# Patient Record
Sex: Female | Born: 1970 | Race: White | Hispanic: Yes | Marital: Married | Smoking: Never smoker
Health system: Southern US, Community
[De-identification: ages and names within clinical notes are randomized; demographics above are authoritative.]

## PROBLEM LIST (undated history)

## (undated) DIAGNOSIS — D649 Anemia, unspecified: Secondary | ICD-10-CM

## (undated) DIAGNOSIS — I10 Essential (primary) hypertension: Secondary | ICD-10-CM

---

## 2000-06-07 ENCOUNTER — Other Ambulatory Visit: Admission: RE | Admit: 2000-06-07 | Discharge: 2000-06-07 | Payer: Self-pay | Admitting: Gynecology

## 2000-12-05 ENCOUNTER — Inpatient Hospital Stay (HOSPITAL_COMMUNITY): Admission: AD | Admit: 2000-12-05 | Discharge: 2000-12-08 | Payer: Self-pay | Admitting: *Deleted

## 2000-12-05 ENCOUNTER — Encounter (INDEPENDENT_AMBULATORY_CARE_PROVIDER_SITE_OTHER): Payer: Self-pay

## 2005-03-04 ENCOUNTER — Encounter: Admission: RE | Admit: 2005-03-04 | Discharge: 2005-03-04 | Payer: Self-pay | Admitting: *Deleted

## 2005-03-22 ENCOUNTER — Encounter: Admission: RE | Admit: 2005-03-22 | Discharge: 2005-03-22 | Payer: Self-pay | Admitting: *Deleted

## 2008-04-02 ENCOUNTER — Ambulatory Visit: Payer: Self-pay | Admitting: Obstetrics & Gynecology

## 2008-04-11 ENCOUNTER — Ambulatory Visit (HOSPITAL_COMMUNITY): Admission: RE | Admit: 2008-04-11 | Discharge: 2008-04-11 | Payer: Self-pay | Admitting: Obstetrics & Gynecology

## 2008-04-30 ENCOUNTER — Ambulatory Visit: Payer: Self-pay | Admitting: Obstetrics and Gynecology

## 2008-11-02 ENCOUNTER — Emergency Department (HOSPITAL_COMMUNITY): Admission: EM | Admit: 2008-11-02 | Discharge: 2008-11-02 | Payer: Self-pay | Admitting: Emergency Medicine

## 2009-01-15 ENCOUNTER — Emergency Department (HOSPITAL_COMMUNITY): Admission: EM | Admit: 2009-01-15 | Discharge: 2009-01-15 | Payer: Self-pay | Admitting: Family Medicine

## 2010-02-19 ENCOUNTER — Encounter: Admission: RE | Admit: 2010-02-19 | Discharge: 2010-02-19 | Payer: Self-pay | Admitting: Family Medicine

## 2010-09-20 ENCOUNTER — Inpatient Hospital Stay (HOSPITAL_COMMUNITY)
Admission: AD | Admit: 2010-09-20 | Discharge: 2010-09-20 | Disposition: A | Discharge status: Home / Self Care | Payer: Self-pay | Source: Ambulatory Visit | Attending: Obstetrics & Gynecology | Admitting: Obstetrics & Gynecology

## 2010-09-20 ENCOUNTER — Inpatient Hospital Stay (HOSPITAL_COMMUNITY): Payer: Self-pay

## 2010-09-20 DIAGNOSIS — O200 Threatened abortion: Secondary | ICD-10-CM | POA: Insufficient documentation

## 2010-09-20 DIAGNOSIS — N939 Abnormal uterine and vaginal bleeding, unspecified: Secondary | ICD-10-CM

## 2010-09-20 LAB — CBC
Platelets: 306 10*3/uL (ref 150–400)
WBC: 5.9 10*3/uL (ref 4.0–10.5)

## 2010-09-20 LAB — WET PREP, GENITAL
Trich, Wet Prep: NONE SEEN
Yeast Wet Prep HPF POC: NONE SEEN

## 2010-09-20 LAB — ABO/RH: ABO/RH(D): O POS

## 2010-09-22 ENCOUNTER — Inpatient Hospital Stay (HOSPITAL_COMMUNITY)
Admission: AD | Admit: 2010-09-22 | Discharge: 2010-09-22 | Disposition: A | Discharge status: Home / Self Care | Payer: Self-pay | Source: Ambulatory Visit | Attending: Obstetrics & Gynecology | Admitting: Obstetrics & Gynecology

## 2010-09-22 DIAGNOSIS — O209 Hemorrhage in early pregnancy, unspecified: Secondary | ICD-10-CM | POA: Insufficient documentation

## 2010-09-22 LAB — POCT PREGNANCY, URINE: Preg Test, Ur: NEGATIVE

## 2010-09-29 ENCOUNTER — Inpatient Hospital Stay (HOSPITAL_COMMUNITY)
Admission: AD | Admit: 2010-09-29 | Discharge: 2010-09-29 | Disposition: A | Discharge status: Home / Self Care | Payer: Self-pay | Source: Ambulatory Visit | Attending: Obstetrics & Gynecology | Admitting: Obstetrics & Gynecology

## 2010-09-29 DIAGNOSIS — O209 Hemorrhage in early pregnancy, unspecified: Secondary | ICD-10-CM | POA: Insufficient documentation

## 2010-10-06 ENCOUNTER — Inpatient Hospital Stay (HOSPITAL_COMMUNITY)
Admission: AD | Admit: 2010-10-06 | Discharge: 2010-10-06 | Disposition: A | Discharge status: Home / Self Care | Payer: Self-pay | Source: Ambulatory Visit | Attending: Obstetrics and Gynecology | Admitting: Obstetrics and Gynecology

## 2010-10-06 DIAGNOSIS — O039 Complete or unspecified spontaneous abortion without complication: Secondary | ICD-10-CM | POA: Insufficient documentation

## 2010-10-06 LAB — HCG, QUANTITATIVE, PREGNANCY: hCG, Beta Chain, Quant, S: 7 m[IU]/mL — ABNORMAL HIGH (ref <5)

## 2010-10-21 ENCOUNTER — Encounter (INDEPENDENT_AMBULATORY_CARE_PROVIDER_SITE_OTHER): Payer: Self-pay | Admitting: Advanced Practice Midwife

## 2010-10-21 DIAGNOSIS — O039 Complete or unspecified spontaneous abortion without complication: Secondary | ICD-10-CM

## 2010-10-22 NOTE — Progress Notes (Unsigned)
NAMELoleta Hernandez               ACCOUNT NO.:  [id number]  MEDICAL RECORD NO.:  [id number]           PATIENT TYPE:  A  LOCATION:  [facility]                   FACILITY:  [facility]  PHYSICIAN:  Wynelle Bourgeois, CNM    DATE OF BIRTH:  1971-01-29  DATE OF SERVICE:  10/21/2010                                 CLINIC NOTE  This is a post-miscarriage exam for this 40 year old female who is gravida 4, para 3-0-1-3.  She experienced a spontaneous complete abortion on October 06, 2010.  She initially came in on September 20, 2010, with HCG of 48.  Ultrasound at that time showed no intrauterine gestational sac or adnexal masses.  Repeat quant was 43 and 42, and then she had her last quant on October 06, 2010, and it was 7.  She had no significant bleeding or cramping with that.  MEDICAL HISTORY:  Remarkable for anemia.  SURGICAL HISTORY:  Remarkable for C-section.  She requires no medication or surgery to complete her miscarriage and presents today just for a final exam.  ALLERGIES:  None.  MEDICATIONS:  Prenatal vitamins.  PHYSICAL EXAMINATION:  VITAL SIGNS:  Temperature 97.6, pulse 84, blood pressure 132/83, weight 147.2, height 62 inches. ABDOMEN:  Soft and nontender.  There are no masses appreciated, although the abdomen is obese and difficult to completely examine. GU:  EG/BUS within normal limits.  There is no bleeding.  Her vagina is clear and well rugated.  Cervix is closed, multiparous.  Uterus is small, well involuted with no masses.  Adnexa clear with no pain or masses although difficult to appreciate secondary to habitus.  ASSESSMENT:  Status post complete spontaneous abortion.  PLAN: 1. The patient reassured that her findings are normal. 2. May resume normal activity. 3. Discussed contraception and the patient prefers to use condoms and     have no prescribed contraception. 4. Annual exam due in August 2012.  The patient will either go to     [facility] or come here for  that.          ______________________________ Wynelle Bourgeois, CNM   MW/MEDQ  D:  10/21/2010  T:  10/22/2010  Job:  [id number]

## 2010-10-24 LAB — POCT URINALYSIS DIP (DEVICE)
Glucose, UA: NEGATIVE mg/dL
Hgb urine dipstick: NEGATIVE
Specific Gravity, Urine: 1.01 (ref 1.005–1.030)

## 2010-10-24 LAB — POCT PREGNANCY, URINE: Preg Test, Ur: NEGATIVE

## 2010-10-27 LAB — URINALYSIS, ROUTINE W REFLEX MICROSCOPIC
Bilirubin Urine: NEGATIVE
Glucose, UA: NEGATIVE mg/dL
Nitrite: NEGATIVE
Protein, ur: NEGATIVE mg/dL
Urobilinogen, UA: 0.2 mg/dL (ref 0.0–1.0)

## 2010-10-27 LAB — URINE MICROSCOPIC-ADD ON

## 2010-10-27 LAB — POCT PREGNANCY, URINE: Preg Test, Ur: NEGATIVE

## 2010-11-30 NOTE — Group Therapy Note (Signed)
NAME:  Christy Hernandez, HELMER       ACCOUNT NO.:  [id number]   MEDICAL RECORD NO.:  [id number]          PATIENT TYPE:  WOC   LOCATION[facility]  [facility]                   FACILITY[facility]  [facility]   PHYSICIAN:  Allie Bossier, MD        DATE OF BIRTH:  1971/03/28   DATE OF SERVICE:                                  CLINIC NOTE   Byrd Hesselbach is a 40 year old married Hispanic gravida 3, para 3 who was seen  for her well-woman exam at the health department and she had GC and  Chlamydia cultures and Pap smear done, they were normal.  This was done  in July 2009.  At that time, her uterus was described as slightly  enlarged, questionable fibroids.  The patient herself says that she has  only rare mild dyspareunia.  Her periods are monthly and last 3 days and  she denies dysmenorrhea.   On physical exam, her uterus is a 14-week size uterus.  I am unable to  differentiate/palpate her adnexa.   ASSESSMENT AND PLAN:  Uterine enlargement, probable fibroids,  asymptomatic.  I will order an ultrasound.  A baseline examination of  the uterus and adnexa, which she will followup after that for results.      Allie Bossier, MD     MCD/MEDQ  D:  04/02/2008  T:  04/03/2008  Job:  [id number]

## 2010-11-30 NOTE — Group Therapy Note (Signed)
Christy Hernandez, Christy Hernandez       ACCOUNT NO.:  [id number]   MEDICAL RECORD NO.:  [id number]          PATIENT TYPE:  WOC   [facility]ATION[facility]  [facility]                   [facility]ACILITY[facility]  [facility]   PHYSICIAN:  Deirdre Poe, CNM       DATE OF BIRTH:  1970-11-27   DATE OF SERVICE:                                  CLINIC NOTE   HISTORY:  This is a 40 year old Hispanic, G3, P3, who had been referred  to [place] on April 02, 2008, to evaluate a clinically enlarged uterus  with questionable fibroids.  This was an incidental clinical findings  when she went for her well-woman exam at the health department.  She was  seen here by Dr. Marice Potter who felt that her uterus was enlarged to  approximately 14 weeks' size, and therefore she was sent for a pelvic  ultrasound.  However, her pelvic ultrasound done on April 11, 2008,  revealed no evidence of uterine fibroids, and the uterus was considered  to be upper limits of normal in size which measured 10 cm __________ 7  cm transverse.  Double endometrial thickness was 7 mm.  Ovaries were  normal.  No adnexal masses or free fluid.  These results were shared  with the patient and she is reassured.  She remains asymptomatic and  will return to [place] p.r.n.            ______________________________  Christy Hernandez, CNM     DP/MEDQ  D:  04/30/2008  T:  05/01/2008  Job:  [id number]

## 2010-12-03 NOTE — Op Note (Signed)
[facility]  Patient:    Christy Hernandez                      MRN: [id number] Proc. Date: 12/06/00 Adm. Date:  [id number] Attending:  Michaelle Copas                           Operative Report  PREOPERATIVE DIAGNOSIS:       Intrauterine pregnancy at term with occult cord prolapse.  POSTOPERATIVE DIAGNOSIS:      Intrauterine pregnancy at term with occult cord prolapse.  OPERATION:                    Low transverse cesarean section under state conditions.  SURGEON:                      Conni Elliot, M.D.  ASSISTANT:                    Jamey Reas, M.D.  ANESTHESIA:                   General orotracheal.  FINDINGS:                     7 pound 4 ounce female with Apgars of 9 and 9, cord pH 7.30.  ESTIMATED BLOOD LOSS:  DESCRIPTION OF PROCEDURE:     The patient was brought emergently to the operating room and placed under general orotracheal anesthesia.  The abdomen was prepped and draped in the usual sterile fashion.  General anesthesia was urgently induced and a low transverse Pfannenstiel incision was made. Incision was made through the skin, subcutaneous tissue, and fascia.  The peritoneal cavity entered, bladder flap created.  A low transverse uterine incision was made.  The baby was delivered from the vertex presentation.  The occult cord prolapse was identified.  The cord was doubly clamped and cut. The baby was handed to neonatologist in attendance.  The placenta was delivered spontaneously.  Uterus, bladder flap, anterior peritoneum, fascia, subcutaneous tissue, and skin were closed in routine fashion.  Estimated blood loss was less than 800 cc.  Sponge, needle, and instrument counts were correct. DD:  12/06/00 TD:  12/06/00 Job: [id number]

## 2010-12-03 NOTE — Discharge Summary (Signed)
[facility]  Patient:    Christy Hernandez                      MRN: [id number] Adm. Date:  [id number] Disc. Date: [id number] Attending:  Michaelle Copas                           Discharge Summary  DATE OF BIRTH:                Mar 24, 1971  DISCHARGE DIAGNOSES:          1. Term pregnancy, delivered.                               2. Occult cord necessitating emergent low                                  transverse cesarean section delivery.  DISCHARGE MEDICATIONS:        1. Prenatal vitamins one p.o. q.d.                               2. Tylox one p.o. q.6h. p.r.n. pain.                               3. Motrin 600 mg p.o. q.6h. p.r.n. pain.  HISTORY OF PRESENT ILLNESS:   Patient is a 40 year old G3, P2-0-0-2 who presented at 78 and 1 weeks in active labor.  Cervical examination on admission was 4, 60, and -1.  She was admitted to labor and delivery with routine orders and was GBS negative.  She was contracting every three to four minutes.  Fetal heart rate was stable 135-145 with good long-term variability and mild variable decelerations.  HOSPITAL COURSE:              The patient was admitted to L&D with routine orders.  She was 4 cm with a bulging bag of waters so artificial rupture of membranes was performed at which time an occult umbilical cord was palpated as presenting and for this reason the patient was taken immediately for emergent low transverse cesarean section.  The fetal heart rate did remain stable and Apgars at delivery were 9 and 9 with a cord pH of 7.30.  EBL was less than 800 cc.  Please see the dictated operative note for details.  Postoperatively the patient did very well with no complications.  She remained afebrile with fundus firm and no pain.  Her hemoglobin remained stable.  She was breast and bottle feeding the baby girl with no difficulties.  On day #3 of her hospitalization it was felt that she was stable for  discharge home. Hemoglobin and hematocrit the day prior to discharge were 10.9 and 32.2.  DISPOSITION:                  The patient was discharged home.  DISCHARGE INSTRUCTIONS:       She was instructed on the care of her incision and to return to [facility]U in three to four days for removal of her staples and then she is to follow-up in six weeks at [facility] for a postpartum check.DD: 12/08/00 TD:  12/09/00 Job: [id number]

## 2011-04-29 LAB — HIV ANTIBODY (ROUTINE TESTING W REFLEX): HIV: NONREACTIVE

## 2011-05-03 ENCOUNTER — Other Ambulatory Visit: Payer: Self-pay | Admitting: Family Medicine

## 2011-05-03 DIAGNOSIS — Z3689 Encounter for other specified antenatal screening: Secondary | ICD-10-CM

## 2011-05-04 ENCOUNTER — Ambulatory Visit (HOSPITAL_COMMUNITY)
Admission: RE | Admit: 2011-05-04 | Discharge: 2011-05-04 | Disposition: A | Discharge status: Home / Self Care | Payer: Medicaid Other | Source: Ambulatory Visit | Attending: Family Medicine | Admitting: Family Medicine

## 2011-05-04 DIAGNOSIS — Z1389 Encounter for screening for other disorder: Secondary | ICD-10-CM | POA: Insufficient documentation

## 2011-05-04 DIAGNOSIS — Z363 Encounter for antenatal screening for malformations: Secondary | ICD-10-CM | POA: Insufficient documentation

## 2011-05-04 DIAGNOSIS — O358XX0 Maternal care for other (suspected) fetal abnormality and damage, not applicable or unspecified: Secondary | ICD-10-CM | POA: Insufficient documentation

## 2011-05-04 DIAGNOSIS — O09529 Supervision of elderly multigravida, unspecified trimester: Secondary | ICD-10-CM | POA: Insufficient documentation

## 2011-05-04 DIAGNOSIS — Z3689 Encounter for other specified antenatal screening: Secondary | ICD-10-CM

## 2011-05-09 ENCOUNTER — Other Ambulatory Visit (HOSPITAL_COMMUNITY): Payer: Self-pay | Admitting: Physician Assistant

## 2011-05-09 DIAGNOSIS — Z0489 Encounter for examination and observation for other specified reasons: Secondary | ICD-10-CM

## 2011-05-25 ENCOUNTER — Ambulatory Visit (HOSPITAL_COMMUNITY): Payer: Medicaid Other

## 2011-05-27 ENCOUNTER — Ambulatory Visit (HOSPITAL_COMMUNITY)
Admission: RE | Admit: 2011-05-27 | Discharge: 2011-05-27 | Disposition: A | Discharge status: Home / Self Care | Payer: Medicaid Other | Source: Ambulatory Visit | Attending: Physician Assistant | Admitting: Physician Assistant

## 2011-05-27 DIAGNOSIS — Z0489 Encounter for examination and observation for other specified reasons: Secondary | ICD-10-CM

## 2011-05-27 DIAGNOSIS — Z3689 Encounter for other specified antenatal screening: Secondary | ICD-10-CM | POA: Insufficient documentation

## 2011-07-19 NOTE — L&D Delivery Note (Signed)
Delivery Note At 2:06 PM a viable and healthy female was delivered via VBAC, Spontaneous (Presentation: ; Occiput Anterior).  APGAR: 9, 9; weight 8 lb 5 oz (3770 g).   Placenta status: Intact, Spontaneous.  Cord: 3 vessels with the following complications: None.  Cord pH: Not indicated  Anesthesia: None  Episiotomy: None Lacerations: 2nd degree;Perineal Suture Repair: 3.0 vicryl Est. Blood Loss (mL):  Mom to postpartum.  Baby to nursery-stable. Dr. Debroah Loop was present for delivery and performed laceration repair.  Christy Hernandez 09/20/2011, 2:34 PM

## 2011-08-26 LAB — STREP B DNA PROBE: GBS: NEGATIVE

## 2011-09-19 ENCOUNTER — Inpatient Hospital Stay (HOSPITAL_COMMUNITY)
Admission: AD | Admit: 2011-09-19 | Discharge: 2011-09-20 | Disposition: A | Discharge status: Home Health | Payer: Medicaid Other | Attending: Obstetrics & Gynecology | Admitting: Obstetrics & Gynecology

## 2011-09-19 ENCOUNTER — Encounter (HOSPITAL_COMMUNITY): Payer: Self-pay | Admitting: *Deleted

## 2011-09-19 DIAGNOSIS — O99891 Other specified diseases and conditions complicating pregnancy: Secondary | ICD-10-CM | POA: Insufficient documentation

## 2011-09-19 DIAGNOSIS — R109 Unspecified abdominal pain: Secondary | ICD-10-CM | POA: Insufficient documentation

## 2011-09-19 NOTE — Progress Notes (Signed)
HURT BAD SINCE -    0600

## 2011-09-20 ENCOUNTER — Inpatient Hospital Stay (HOSPITAL_COMMUNITY)
Admission: AD | Admit: 2011-09-20 | Discharge: 2011-09-22 | DRG: 775 | Disposition: A | Discharge status: Home / Self Care | Payer: Medicaid Other | Attending: Obstetrics & Gynecology | Admitting: Obstetrics & Gynecology

## 2011-09-20 ENCOUNTER — Encounter (HOSPITAL_COMMUNITY): Payer: Self-pay | Admitting: *Deleted

## 2011-09-20 DIAGNOSIS — O09529 Supervision of elderly multigravida, unspecified trimester: Secondary | ICD-10-CM | POA: Diagnosis present

## 2011-09-20 DIAGNOSIS — O34219 Maternal care for unspecified type scar from previous cesarean delivery: Secondary | ICD-10-CM | POA: Diagnosis present

## 2011-09-20 LAB — CBC
Platelets: 310 10*3/uL (ref 150–400)
RBC: 3.85 MIL/uL — ABNORMAL LOW (ref 3.87–5.11)
WBC: 10.4 10*3/uL (ref 4.0–10.5)

## 2011-09-20 MED ORDER — ZOLPIDEM TARTRATE 5 MG PO TABS: 5.0000 mg | ORAL_TABLET | Freq: Every evening | ORAL | Status: DC | PRN

## 2011-09-20 MED ORDER — DIBUCAINE 1 % RE OINT: 1.0000 "application " | TOPICAL_OINTMENT | RECTAL | Status: DC | PRN

## 2011-09-20 MED ORDER — LACTATED RINGERS IV SOLN
500.0000 mL | INTRAVENOUS | Status: DC | PRN
  Administered 2011-09-20: 500 mL via INTRAVENOUS

## 2011-09-20 MED ORDER — TERBUTALINE SULFATE 1 MG/ML IJ SOLN: 0.2500 mg | Freq: Once | INTRAMUSCULAR | Status: DC | PRN

## 2011-09-20 MED ORDER — BENZOCAINE-MENTHOL 20-0.5 % EX AERO: 1.0000 "application " | INHALATION_SPRAY | CUTANEOUS | Status: DC | PRN

## 2011-09-20 MED ORDER — LACTATED RINGERS IV SOLN
INTRAVENOUS | Status: DC
  Administered 2011-09-20 (×2): via INTRAVENOUS

## 2011-09-20 MED ORDER — ONDANSETRON HCL 4 MG/2ML IJ SOLN: 4.0000 mg | Freq: Four times a day (QID) | INTRAMUSCULAR | Status: DC | PRN

## 2011-09-20 MED ORDER — OXYTOCIN BOLUS FROM INFUSION
500.0000 mL | Freq: Once | INTRAVENOUS | Status: DC
  Filled 2011-09-20: qty 500

## 2011-09-20 MED ORDER — ACETAMINOPHEN 325 MG PO TABS: 650.0000 mg | ORAL_TABLET | ORAL | Status: DC | PRN

## 2011-09-20 MED ORDER — SIMETHICONE 80 MG PO CHEW: 80.0000 mg | CHEWABLE_TABLET | ORAL | Status: DC | PRN

## 2011-09-20 MED ORDER — OXYTOCIN 20 UNITS IN LACTATED RINGERS INFUSION - SIMPLE: 125.0000 mL/h | Freq: Once | INTRAVENOUS | Status: DC

## 2011-09-20 MED ORDER — DIPHENHYDRAMINE HCL 25 MG PO CAPS: 25.0000 mg | ORAL_CAPSULE | Freq: Four times a day (QID) | ORAL | Status: DC | PRN

## 2011-09-20 MED ORDER — WITCH HAZEL-GLYCERIN EX PADS: 1.0000 "application " | MEDICATED_PAD | CUTANEOUS | Status: DC | PRN

## 2011-09-20 MED ORDER — ONDANSETRON HCL 4 MG/2ML IJ SOLN: 4.0000 mg | INTRAMUSCULAR | Status: DC | PRN

## 2011-09-20 MED ORDER — HYDROXYZINE HCL 50 MG/ML IM SOLN: 50.0000 mg | Freq: Four times a day (QID) | INTRAMUSCULAR | Status: DC | PRN

## 2011-09-20 MED ORDER — LIDOCAINE HCL (PF) 1 % IJ SOLN
30.0000 mL | INTRAMUSCULAR | Status: DC | PRN
  Administered 2011-09-20: 30 mL via SUBCUTANEOUS
  Filled 2011-09-20: qty 30

## 2011-09-20 MED ORDER — TETANUS-DIPHTH-ACELL PERTUSSIS 5-2.5-18.5 LF-MCG/0.5 IM SUSP: 0.5000 mL | Freq: Once | INTRAMUSCULAR | Status: DC

## 2011-09-20 MED ORDER — FLEET ENEMA 7-19 GM/118ML RE ENEM: 1.0000 | ENEMA | RECTAL | Status: DC | PRN

## 2011-09-20 MED ORDER — LANOLIN HYDROUS EX OINT: TOPICAL_OINTMENT | CUTANEOUS | Status: DC | PRN

## 2011-09-20 MED ORDER — OXYTOCIN 20 UNITS IN LACTATED RINGERS INFUSION - SIMPLE
1.0000 m[IU]/min | INTRAVENOUS | Status: DC
  Administered 2011-09-20: 333 m[IU]/min via INTRAVENOUS
  Administered 2011-09-20: 2 m[IU]/min via INTRAVENOUS
  Filled 2011-09-20: qty 1000

## 2011-09-20 MED ORDER — IBUPROFEN 600 MG PO TABS
600.0000 mg | ORAL_TABLET | Freq: Four times a day (QID) | ORAL | Status: DC
  Administered 2011-09-20 – 2011-09-22 (×7): 600 mg via ORAL
  Filled 2011-09-20 (×7): qty 1

## 2011-09-20 MED ORDER — ZOLPIDEM TARTRATE 5 MG PO TABS
5.0000 mg | ORAL_TABLET | Freq: Once | ORAL | Status: AC
  Administered 2011-09-20: 5 mg via ORAL
  Filled 2011-09-20: qty 1

## 2011-09-20 MED ORDER — OXYCODONE-ACETAMINOPHEN 5-325 MG PO TABS: 1.0000 | ORAL_TABLET | ORAL | Status: DC | PRN

## 2011-09-20 MED ORDER — ONDANSETRON HCL 4 MG PO TABS: 4.0000 mg | ORAL_TABLET | ORAL | Status: DC | PRN

## 2011-09-20 MED ORDER — SENNOSIDES-DOCUSATE SODIUM 8.6-50 MG PO TABS
2.0000 | ORAL_TABLET | Freq: Every day | ORAL | Status: DC
  Administered 2011-09-21 – 2011-09-22 (×2): 2 via ORAL

## 2011-09-20 MED ORDER — IBUPROFEN 600 MG PO TABS
600.0000 mg | ORAL_TABLET | Freq: Four times a day (QID) | ORAL | Status: DC | PRN
  Administered 2011-09-20: 600 mg via ORAL
  Filled 2011-09-20: qty 1

## 2011-09-20 MED ORDER — NALBUPHINE SYRINGE 5 MG/0.5 ML
5.0000 mg | INJECTION | INTRAMUSCULAR | Status: DC | PRN
  Administered 2011-09-20: 5 mg via INTRAVENOUS
  Administered 2011-09-20: 10 mg via INTRAVENOUS
  Filled 2011-09-20: qty 1
  Filled 2011-09-20: qty 0.5

## 2011-09-20 MED ORDER — HYDROXYZINE HCL 50 MG PO TABS: 50.0000 mg | ORAL_TABLET | Freq: Four times a day (QID) | ORAL | Status: DC | PRN

## 2011-09-20 MED ORDER — PRENATAL MULTIVITAMIN CH
1.0000 | ORAL_TABLET | Freq: Every day | ORAL | Status: DC
  Administered 2011-09-21 – 2011-09-22 (×2): 1 via ORAL
  Filled 2011-09-20 (×2): qty 1

## 2011-09-20 MED ORDER — CITRIC ACID-SODIUM CITRATE 334-500 MG/5ML PO SOLN: 30.0000 mL | ORAL | Status: DC | PRN

## 2011-09-20 NOTE — H&P (Signed)
Christy Hernandez is a 41 y.o. year old G43P3013 female at [redacted]w[redacted]d weeks gestation who presents to MAU reporting Spontaneous rupture of membranes at 0600 and labor. She was seen in MAU during the night w/ VE 3.5/thick. No cervical change. She has received care at [facility]. She has a Hx of LTCS w/ third child due to NRFHT's, SVD w/ first two. She desires TOLAC, consent and Op notes on chart. No mention of two-layer closure.  Maternal Medical History:  Reason for admission: Reason for admission: rupture of membranes.  Contractions: Onset was 6-12 hours ago.   Frequency: regular.   Perceived severity is strong.    Fetal activity: Perceived fetal activity is normal.   Last perceived fetal movement was within the past hour.    Prenatal Complications - Diabetes: none.    OB History    Grav Para Term Preterm Abortions TAB SAB Ect Mult Living   5 3 3  1  1   3      Past Medical History  Diagnosis Date  . No pertinent past medical history    Past Surgical History  Procedure Date  . Cesarean section    Family History: family history is not on file. Social History:  reports that she has never smoked. She does not have any smokeless tobacco history on file. She reports that she does not drink alcohol or use illicit drugs.  Review of Systems  Eyes: Negative for blurred vision.  Neurological: Negative for headaches.      There were no vitals taken for this visit. Maternal Exam:  Uterine Assessment: Contraction strength is firm.  Contraction frequency is regular.   Abdomen: Fetal presentation: vertex  Introitus: Amniotic fluid character: meconium stained. Grossly ruptured light MSF per RN  Pelvis: adequate for delivery.   Cervix: Cervix evaluated by digital exam.     Fetal Exam Fetal Monitor Review: Mode: ultrasound.   Baseline rate: 160.  Variability: moderate (6-25 bpm).   Pattern: accelerations present and no decelerations.    Fetal State Assessment: Category I - tracings are  normal.    BP 129/81  Pulse 110  Temp(Src) 98.5 F (36.9 C) (Oral)  Resp 20  Ht 5' (1.524 m)  Wt 74.844 kg (165 lb)  BMI 32.22 kg/m2  Physical Exam  Nursing note and vitals reviewed. Constitutional: She is oriented to person, place, and time. She appears well-developed and well-nourished. She appears distressed.  GI: Soft. There is no tenderness.  Neurological: She is alert and oriented to person, place, and time.  Skin: Skin is warm and dry.  Psychiatric: She has a normal mood and affect.    Prenatal labs: ABO, Rh: --/--/O POS (03/05 1655) Antibody: Negative (10/10 0000) Rubella: Immune (10/12 0000) RPR: Nonreactive (10/12 0000)  HBsAg: Negative (10/12 0000)  HIV: Non-reactive (10/12 0000)  GBS: Negative (02/08 0000) 1 hour GTT 85 Quad screen neg   Assessment: 1. Labor: active  2. Fetal Wellbeing:  3. Pain Control: requesting Nubain 4. GBS: neg 5. 40 week IUP 6. Prior C/S, desires TOLAC  Plan:  1. Admit to BS per consult with MD 2. Routine L&D orders 3. Analgesia/anesthesia PRN  4. TOLAC  Dorathy Kinsman 09/20/2011, 7:49 AM

## 2011-09-20 NOTE — Discharge Instructions (Signed)
Braxton Hicks Contractions Pregnancy is commonly associated with contractions of the uterus throughout the pregnancy. Towards the end of pregnancy (32 to 34 weeks), these contractions [facility] Rough) can develop more often and may become more forceful. This is not true labor because these contractions do not result in opening (dilatation) and thinning of the cervix. They are sometimes difficult to tell apart from true labor because these contractions can be forceful and people have different pain tolerances. You should not feel embarrassed if you go to the hospital with false labor. Sometimes, the only way to tell if you are in true labor is for your caregiver to follow the changes in the cervix. How to tell the difference between true and false labor:  False labor.   The contractions of false labor are usually shorter, irregular and not as hard as those of true labor.   They are often felt in the front of the lower abdomen and in the groin.   They may leave with walking around or changing positions while lying down.   They get weaker and are shorter lasting as time goes on.   These contractions are usually irregular.   They do not usually become progressively stronger, regular and closer together as with true labor.   True labor.   Contractions in true labor last 30 to 70 seconds, become very regular, usually become more intense, and increase in frequency.   They do not go away with walking.   The discomfort is usually felt in the top of the uterus and spreads to the lower abdomen and low back.   True labor can be determined by your caregiver with an exam. This will show that the cervix is dilating and getting thinner.  If there are no prenatal problems or other health problems associated with the pregnancy, it is completely safe to be sent home with false labor and await the onset of true labor. HOME CARE INSTRUCTIONS   Keep up with your usual exercises and instructions.   Take  medications as directed.   Keep your regular prenatal appointment.   Eat and drink lightly if you think you are going into labor.   If BH contractions are making you uncomfortable:   Change your activity position from lying down or resting to walking/walking to resting.   Sit and rest in a tub of warm water.   Drink 2 to 3 glasses of water. Dehydration may cause B-H contractions.   Do slow and deep breathing several times an hour.  SEEK IMMEDIATE MEDICAL CARE IF:   Your contractions continue to become stronger, more regular, and closer together.   You have a gushing, burst or leaking of fluid from the vagina.   An oral temperature above 102 F (38.9 C) develops.   You have passage of blood-tinged mucus.   You develop vaginal bleeding.   You develop continuous belly (abdominal) pain.   You have low back pain that you never had before.   You feel the baby's head pushing down causing pelvic pressure.   The baby is not moving as much as it used to.  Document Released: 07/04/2005 Document Revised: 06/23/2011 Document Reviewed: 12/26/2008 [facility] Patient Information 2012 [place], [place].Contracciones de Designer, multimedia ([employer]) Usted presenta un falso trabajo de Gantt. Durante todo el embarazo aparecen con frecuencia contracciones del tero. Hacia el final del embarazo (32-34 semanas) estas contracciones (Braxton Hicks) pueden hacerse ms fuertes. No se trata de un trabajo de parto verdadero porque no producen  un agrandamiento (dilatacin) y afinamiento del cuello del tero. Algunas veces resulta difcil distinguirlas del trabajo de parto verdadero porque en algunos casos llegan a ser muy intensas y las personas tienen distinta tolerancia al [employer]. No debe sentirse avergonzada si ingresa al hospital con un falso trabajo de Singer. En ocasiones la nica forma de saber si est en un parto verdadero es observar los cambios en el cuello del tero. A veces, la nica forma  de saber si realmente est en trabajo de parto es para el mdico observar los cambios en el tero. Como diferenciar el Glandorf de parto falso del verdadero:  Aggie Cosier de parto falso.   Las contracciones falsas generalmente duran menos y no son tan intensas como las verdaderas.   Generalmente se sienten en la zona inferior del abdomen y en la ingle.   Pueden aliviarse con una caminata o cambiar de posicin mientras se est acostada.   A medida que pasa el tiempo son ms cortas y dbiles.   Generalmente son irregulares.   No se hacen progresivamente ms intensas y Herbalist entre s [employer].   Trabajo de parto verdadero.   Las contracciones verdaderas duran de 30 a 70 segundos, son ms regulares, generalmente se hacen ms intensas y Comptroller.   No desaparecen al caminar.   La molestia generalmente se siente en la parte superior del tero y se extiende hacia la zona inferior del abdomen y [employer] cintura.   El profesional que la asiste podr examinarla para determinar si el trabajo de parto es verdadero. El examen mostrar si el cuello del tero se est dilatando y [place].  Si no hay problemas prenatales u otras complicaciones de la salud asociadas al embarazo, no habr inconvenientes si la envan a su casa y espera el comienzo del verdadero trabajo de Williams. INSTRUCCIONES PARA EL CUIDADO DOMICILIARIO  Siga con los ejercicios y las indicaciones habituales.   Tome los medicamentos como se le indic.   Cumpla con las citas regularmente.   Coma y beba ligero si cree que dar a luz.   Si se siente incmoda por las contracciones:   Cambie de [place], si est acostada o en reposo, camine y si est caminando, repose.   Sintense y repose en una baadera con agua caliente.   Beba entre 2 y 3 vasos de [place]. La deshidratacin puede causar contracciones BH.   Respire lenta y profundamente varias veces por hora.  SOLICITE ATENCIN MDICA DE INMEDIATO  SI:  Las contracciones se intensifican, se hacen ms regulares y [employer] s.   Tiene una prdida importante de lquido de la vagina   La temperatura oral se eleva sin motivo por encima de 102 F (38.9 C) o segn le indique el profesional que la asiste.   Elimina una mucosidad sanguinolenta.   Presenta hemorragia vaginal.   Presenta dolor abdominal constante.   Siente un dolor en la parte baja de la espalda que nunca haba sentido antes.   Siente que el beb empuja hacia abajo y le causa presin plvica.   El beb no se mueve tanto como antes.  Document Released: 04/13/2005 Document Revised: 06/23/2011 [facility] Patient Information 2012 [place], [place].

## 2011-09-20 NOTE — Progress Notes (Signed)
Subjective: Patient doing well. Good relief with Nubain  Objective: BP 127/72  Pulse 124  Temp(Src) 97.9 F (36.6 C) (Oral)  Resp 18  Ht 5' (1.524 m)  Wt 74.844 kg (165 lb)  BMI 32.22 kg/m2      FHT:  FHR: 150 bpm, variability: moderate,  accelerations:  Abscent,  decelerations:  Absent UC:   regular, every 3-4 minutes SVE:   Dilation: 6 Effacement (%): 70 Station: -1 Exam by:: [email or web address], rn  Labs: Lab Results  Component Value Date   WBC 10.4 09/20/2011   HGB 11.9* 09/20/2011   HCT 35.7* 09/20/2011   MCV 92.7 09/20/2011   PLT 310 09/20/2011    Assessment / Plan: Spontaneous labor, progressing normally  Labor: IUPC placed, will start pitocin Fetal Wellbeing:  Category I Pain Control:  Nubain I/D:  n/a Anticipated MOD:  NSVD  Ala Dach 09/20/2011, 10:46 AM

## 2011-09-21 LAB — RPR: RPR Ser Ql: NONREACTIVE

## 2011-09-21 NOTE — Progress Notes (Signed)
Patient ID: Christy Hernandez, female   DOB: 1971-02-19, 41 y.o.   MRN: [id number]  I have seen/examined this patient and agree with the previous note assessment and plan. Antrone [place] .

## 2011-09-21 NOTE — Progress Notes (Signed)
UR chart review completed.  

## 2011-09-21 NOTE — Progress Notes (Signed)
Post Partum Day 1 Subjective: up ad lib, voiding, tolerating PO and light pain controlled with medication.  Patient is bottle and breastfeeding without difficulties.  She plans on using condoms for future family planning.  Interpretor present for history.  Objective: Blood pressure 96/62, pulse 84, temperature 98.6 F (37 C), temperature source Oral, resp. rate 18, height 5' (1.524 m), weight 74.844 kg (165 lb), SpO2 97.00%, unknown if currently breastfeeding.  Physical Exam:  General: alert, cooperative and no distress Lochia: appropriate Uterine Fundus: firm Incision: none DVT Evaluation: No evidence of DVT seen on physical exam.   Basename 09/20/11 0759  HGB 11.9*  HCT 35.7*    Assessment/Plan:  40yo [id number] s/p VBAC doing well.  Patient is bottle and breastfeeding.  She plans on using condoms for future family planning. Plan for discharge tomorrow   LOS: 1 day   Mardene Speak 09/21/2011, 7:24 AM

## 2011-09-22 MED ORDER — IBUPROFEN 600 MG PO TABS: 600.0000 mg | ORAL_TABLET | Freq: Four times a day (QID) | ORAL | Status: AC | PRN

## 2011-09-22 NOTE — Discharge Summary (Signed)
Obstetric Discharge Summary  Christy Hernandez is a 41yo [id number] at PPD #2 after VBAC doing well with no complaints at this time.  Her pain is well controlled with Ibuprofen. She continues to breast and bottle feed.  She plans on using condoms for future family planning.  Reason for Admission: onset of labor Prenatal Procedures: none Intrapartum Procedures: spontaneous vaginal delivery Postpartum Procedures: none Complications-Operative and Postpartum: 2nd degree perineal laceration Hemoglobin  Date Value Range Status  09/20/2011 11.9* 12.0-15.0 (g/dL) Final     HCT  Date Value Range Status  09/20/2011 35.7* 36.0-46.0 (%) Final    Discharge Diagnoses: Term Pregnancy-delivered  Discharge Information: Date: 09/22/2011 Activity: unrestricted and pelvic rest Diet: routine Medications: Ibuprofen Condition: stable Instructions: refer to practice specific booklet Discharge to: home   Newborn Data: Live born female  Birth Weight: 8 lb 5 oz (3771 g) APGAR: 9, 9  Home with mother. Patient aware to follow up at HD in 6 weeks.  Mardene Speak 09/22/2011, 12:10 PM  Saw pt and agree. Tanette Chauca 12:41 PM

## 2011-09-22 NOTE — Discharge Summary (Signed)
Obstetric Discharge Summary Reason for Admission: onset of labor Prenatal Procedures: ultrasound Intrapartum Procedures: spontaneous vaginal delivery VBAC Postpartum Procedures: none Complications-Operative and Postpartum: 2nd degree perineal laceration Hemoglobin  Date Value Range Status  09/20/2011 11.9* 12.0-15.0 (g/dL) Final     HCT  Date Value Range Status  09/20/2011 35.7* 36.0-46.0 (%) Final    Discharge Diagnoses: Term Pregnancy-delivered  Discharge Information: Date: 09/22/2011 Activity: pelvic rest Diet: routine Medications: PNV and Ibuprofen Colace Condition: stable Instructions: refer to practice specific booklet Discharge to: home Follow-up Information    Follow up with [facility]. Schedule an appointment as soon as possible for a visit in 6 weeks.         Newborn Data: Live born female  Birth Weight: 8 lb 5 oz (3771 g) APGAR: 9, 9  Home with mother.  Symia Herdt 09/22/2011, 12:49 PM

## 2011-09-22 NOTE — Progress Notes (Signed)
Agree with PA student note

## 2012-11-26 ENCOUNTER — Other Ambulatory Visit (HOSPITAL_COMMUNITY): Payer: Self-pay | Admitting: Nurse Practitioner

## 2012-11-26 DIAGNOSIS — Z1231 Encounter for screening mammogram for malignant neoplasm of breast: Secondary | ICD-10-CM

## 2012-12-13 ENCOUNTER — Ambulatory Visit (HOSPITAL_COMMUNITY)
Admission: RE | Admit: 2012-12-13 | Discharge: 2012-12-13 | Disposition: A | Discharge status: Home / Self Care | Payer: Self-pay | Source: Ambulatory Visit | Attending: Nurse Practitioner | Admitting: Nurse Practitioner

## 2012-12-13 DIAGNOSIS — Z1231 Encounter for screening mammogram for malignant neoplasm of breast: Secondary | ICD-10-CM

## 2013-11-15 ENCOUNTER — Other Ambulatory Visit (HOSPITAL_COMMUNITY): Payer: Self-pay | Admitting: Nurse Practitioner

## 2014-01-23 ENCOUNTER — Other Ambulatory Visit (HOSPITAL_COMMUNITY): Payer: Self-pay | Admitting: Nurse Practitioner

## 2014-01-23 DIAGNOSIS — Z1231 Encounter for screening mammogram for malignant neoplasm of breast: Secondary | ICD-10-CM

## 2014-01-31 ENCOUNTER — Ambulatory Visit (HOSPITAL_COMMUNITY)
Admission: RE | Admit: 2014-01-31 | Discharge: 2014-01-31 | Disposition: A | Discharge status: Home / Self Care | Payer: Medicaid Other | Source: Ambulatory Visit | Attending: Nurse Practitioner | Admitting: Nurse Practitioner

## 2014-01-31 DIAGNOSIS — Z1231 Encounter for screening mammogram for malignant neoplasm of breast: Secondary | ICD-10-CM

## 2014-03-20 ENCOUNTER — Other Ambulatory Visit (HOSPITAL_COMMUNITY): Payer: Self-pay | Admitting: *Deleted

## 2014-03-20 DIAGNOSIS — N6310 Unspecified lump in the right breast, unspecified quadrant: Secondary | ICD-10-CM

## 2014-03-20 DIAGNOSIS — N644 Mastodynia: Secondary | ICD-10-CM

## 2014-04-08 ENCOUNTER — Ambulatory Visit
Admission: RE | Admit: 2014-04-08 | Discharge: 2014-04-08 | Disposition: A | Discharge status: Home / Self Care | Payer: No Typology Code available for payment source | Source: Ambulatory Visit | Attending: Obstetrics and Gynecology | Admitting: Obstetrics and Gynecology

## 2014-04-08 ENCOUNTER — Encounter (HOSPITAL_COMMUNITY): Payer: Self-pay

## 2014-04-08 ENCOUNTER — Ambulatory Visit
Admission: RE | Admit: 2014-04-08 | Discharge: 2014-04-08 | Disposition: A | Discharge status: Home / Self Care | Payer: Self-pay | Source: Ambulatory Visit | Attending: Obstetrics and Gynecology | Admitting: Obstetrics and Gynecology

## 2014-04-08 ENCOUNTER — Other Ambulatory Visit (HOSPITAL_COMMUNITY): Payer: Self-pay | Admitting: Obstetrics and Gynecology

## 2014-04-08 ENCOUNTER — Ambulatory Visit (HOSPITAL_COMMUNITY)
Admission: RE | Admit: 2014-04-08 | Discharge: 2014-04-08 | Disposition: A | Discharge status: Home / Self Care | Payer: Self-pay | Source: Ambulatory Visit | Attending: Obstetrics and Gynecology | Admitting: Obstetrics and Gynecology

## 2014-04-08 VITALS — BP 118/76 | Temp 98.1°F | Ht 63.0 in | Wt 141.6 lb

## 2014-04-08 DIAGNOSIS — N6310 Unspecified lump in the right breast, unspecified quadrant: Secondary | ICD-10-CM

## 2014-04-08 DIAGNOSIS — N644 Mastodynia: Secondary | ICD-10-CM

## 2014-04-08 DIAGNOSIS — Z1239 Encounter for other screening for malignant neoplasm of breast: Secondary | ICD-10-CM

## 2014-04-08 HISTORY — DX: Essential (primary) hypertension: I10

## 2014-04-08 NOTE — Progress Notes (Signed)
Complaints of right breast lump, redness, itching, swelling, and pain since the end of July. Patient states pain is constant rating it at a 4 out of 10.  Pap Smear:  Pap smear not completed today. Last Pap smear was in April 2014 at the [facility] and normal per patient. Per patient has no history of an abnormal Pap smear. No Pap smear results in EPIC.  Physical exam: Breasts Right breast larger than left breast and swollen. Observed two scars left inner breast from history of breast surgery in the past per patient. An area of redness observed on right breast at 9 o'clock. No nipple retraction left breast. Right nipple inverted. Per patient the right nipple inversion has happened since the end of July. No nipple discharge bilateral breasts. No lymphadenopathy. No lumps palpated left breast. Palpated a mass within the right upper outer breast between 9 o'clock and 12 o'clock. Complaints of right breast pain around nipple area on exam. Referred patient to the [facility] for right breast diagnostic mammogram and ultrasound. Appointment scheduled for Tuesday, April 08, 2014 at 1030.   Pelvic/Bimanual No Pap smear completed today since last Pap smear was April 2014. Pap smear not indicated per BCCCP guidelines.

## 2014-04-08 NOTE — Patient Instructions (Signed)
Explained to  Christy Hernandez that she did not need a Pap smear today due to last Pap smear was in April 2014 per patient. Let her know BCCCP will cover Pap smears every 3 years unless has a history of abnormal Pap smears. Referred patient to the [facility] for right breast diagnostic mammogram and ultrasound. Appointment scheduled for Tuesday, April 08, 2014 at 1030. Patient aware of appointment and will be there. Christy Hernandez verbalized understanding.  Gerren Hoffmeier, Kathaleen Maser, RN 10:05 AM

## 2014-04-11 ENCOUNTER — Ambulatory Visit
Admission: RE | Admit: 2014-04-11 | Discharge: 2014-04-11 | Disposition: A | Discharge status: Home / Self Care | Payer: No Typology Code available for payment source | Source: Ambulatory Visit | Attending: Obstetrics and Gynecology | Admitting: Obstetrics and Gynecology

## 2014-04-11 ENCOUNTER — Ambulatory Visit
Admission: RE | Admit: 2014-04-11 | Discharge: 2014-04-11 | Disposition: A | Discharge status: Home / Self Care | Payer: Self-pay | Source: Ambulatory Visit | Attending: Obstetrics and Gynecology | Admitting: Obstetrics and Gynecology

## 2014-04-11 ENCOUNTER — Other Ambulatory Visit (HOSPITAL_COMMUNITY): Payer: Self-pay | Admitting: Obstetrics and Gynecology

## 2014-04-11 DIAGNOSIS — N644 Mastodynia: Secondary | ICD-10-CM

## 2014-04-11 DIAGNOSIS — N6310 Unspecified lump in the right breast, unspecified quadrant: Secondary | ICD-10-CM

## 2014-04-14 ENCOUNTER — Other Ambulatory Visit: Payer: Self-pay | Admitting: Obstetrics and Gynecology

## 2014-04-14 DIAGNOSIS — N611 Abscess of the breast and nipple: Secondary | ICD-10-CM

## 2014-04-14 LAB — CULTURE, ROUTINE-ABSCESS: CULTURE: NO GROWTH

## 2014-04-25 ENCOUNTER — Other Ambulatory Visit: Payer: Self-pay | Admitting: Obstetrics and Gynecology

## 2014-04-25 ENCOUNTER — Ambulatory Visit
Admission: RE | Admit: 2014-04-25 | Discharge: 2014-04-25 | Disposition: A | Discharge status: Home / Self Care | Payer: No Typology Code available for payment source | Source: Ambulatory Visit | Attending: Obstetrics and Gynecology | Admitting: Obstetrics and Gynecology

## 2014-04-25 DIAGNOSIS — N611 Abscess of the breast and nipple: Secondary | ICD-10-CM

## 2014-04-28 ENCOUNTER — Other Ambulatory Visit: Payer: Self-pay

## 2014-04-28 LAB — CULTURE, ROUTINE-ABSCESS: CULTURE: NO GROWTH

## 2014-05-19 ENCOUNTER — Encounter (HOSPITAL_COMMUNITY): Payer: Self-pay

## 2014-05-19 ENCOUNTER — Other Ambulatory Visit (INDEPENDENT_AMBULATORY_CARE_PROVIDER_SITE_OTHER): Payer: Self-pay

## 2014-05-19 DIAGNOSIS — N611 Abscess of the breast and nipple: Secondary | ICD-10-CM

## 2014-05-23 ENCOUNTER — Ambulatory Visit
Admission: RE | Admit: 2014-05-23 | Discharge: 2014-05-23 | Disposition: A | Discharge status: Home / Self Care | Payer: No Typology Code available for payment source | Source: Ambulatory Visit | Attending: General Surgery | Admitting: General Surgery

## 2014-05-23 DIAGNOSIS — N611 Abscess of the breast and nipple: Secondary | ICD-10-CM

## 2014-05-26 ENCOUNTER — Telehealth (INDEPENDENT_AMBULATORY_CARE_PROVIDER_SITE_OTHER): Payer: Self-pay

## 2014-05-26 NOTE — Telephone Encounter (Signed)
Pt made aware results show residual infection but no pus needing to be drained.  Finish antibiotics.  She has f/u appointment on 06/20/14 with Dr. Donell BeersByerly.

## 2014-10-28 ENCOUNTER — Other Ambulatory Visit: Payer: Self-pay | Admitting: General Surgery

## 2014-10-28 DIAGNOSIS — N611 Abscess of the breast and nipple: Secondary | ICD-10-CM

## 2014-11-25 ENCOUNTER — Ambulatory Visit
Admission: RE | Admit: 2014-11-25 | Discharge: 2014-11-25 | Disposition: A | Discharge status: Home / Self Care | Payer: No Typology Code available for payment source | Source: Ambulatory Visit | Attending: General Surgery | Admitting: General Surgery

## 2014-11-25 DIAGNOSIS — N611 Abscess of the breast and nipple: Secondary | ICD-10-CM

## 2015-04-17 LAB — OB RESULTS CONSOLE HGB/HCT, BLOOD
HCT: 26 %
Hemoglobin: 7.8 g/dL

## 2015-04-17 LAB — OB RESULTS CONSOLE PLATELET COUNT: PLATELETS: 409 10*3/uL

## 2015-05-28 ENCOUNTER — Other Ambulatory Visit: Payer: Self-pay | Admitting: General Surgery

## 2015-05-28 DIAGNOSIS — N63 Unspecified lump in unspecified breast: Secondary | ICD-10-CM

## 2015-06-01 LAB — OB RESULTS CONSOLE RPR: RPR: NONREACTIVE

## 2015-06-01 LAB — OB RESULTS CONSOLE GC/CHLAMYDIA
CHLAMYDIA, DNA PROBE: NEGATIVE
Gonorrhea: NEGATIVE

## 2015-06-01 LAB — OB RESULTS CONSOLE HIV ANTIBODY (ROUTINE TESTING): HIV: NONREACTIVE

## 2015-06-01 LAB — OB RESULTS CONSOLE ANTIBODY SCREEN: Antibody Screen: NEGATIVE

## 2015-06-01 LAB — OB RESULTS CONSOLE ABO/RH: RH Type: POSITIVE

## 2015-06-01 LAB — OB RESULTS CONSOLE HGB/HCT, BLOOD
HCT: 34 %
HEMOGLOBIN: 10.8 g/dL

## 2015-06-01 LAB — OB RESULTS CONSOLE RUBELLA ANTIBODY, IGM: RUBELLA: IMMUNE

## 2015-06-01 LAB — OB RESULTS CONSOLE HEPATITIS B SURFACE ANTIGEN: Hepatitis B Surface Ag: NEGATIVE

## 2015-06-01 LAB — OB RESULTS CONSOLE PLATELET COUNT: PLATELETS: 343 10*3/uL

## 2015-06-01 LAB — OB RESULTS CONSOLE VARICELLA ZOSTER ANTIBODY, IGG: Varicella: IMMUNE

## 2015-06-03 ENCOUNTER — Other Ambulatory Visit (HOSPITAL_COMMUNITY): Payer: Self-pay | Admitting: *Deleted

## 2015-06-03 DIAGNOSIS — N61 Mastitis without abscess: Secondary | ICD-10-CM

## 2015-06-08 ENCOUNTER — Other Ambulatory Visit: Payer: Self-pay | Admitting: General Surgery

## 2015-06-08 ENCOUNTER — Other Ambulatory Visit: Payer: Self-pay

## 2015-06-08 DIAGNOSIS — N63 Unspecified lump in unspecified breast: Secondary | ICD-10-CM

## 2015-06-09 ENCOUNTER — Encounter (HOSPITAL_COMMUNITY): Payer: Self-pay

## 2015-06-09 ENCOUNTER — Ambulatory Visit (HOSPITAL_COMMUNITY)
Admission: RE | Admit: 2015-06-09 | Discharge: 2015-06-09 | Disposition: A | Discharge status: Home / Self Care | Payer: Medicaid Other | Source: Ambulatory Visit | Attending: Obstetrics and Gynecology | Admitting: Obstetrics and Gynecology

## 2015-06-09 ENCOUNTER — Ambulatory Visit
Admission: RE | Admit: 2015-06-09 | Discharge: 2015-06-09 | Disposition: A | Discharge status: Home / Self Care | Payer: No Typology Code available for payment source | Source: Ambulatory Visit | Attending: General Surgery | Admitting: General Surgery

## 2015-06-09 VITALS — BP 112/68 | Temp 97.9°F | Ht 63.0 in | Wt 144.0 lb

## 2015-06-09 DIAGNOSIS — Z1239 Encounter for other screening for malignant neoplasm of breast: Secondary | ICD-10-CM

## 2015-06-09 DIAGNOSIS — N63 Unspecified lump in unspecified breast: Secondary | ICD-10-CM

## 2015-06-09 HISTORY — DX: Anemia, unspecified: D64.9

## 2015-06-09 NOTE — Progress Notes (Addendum)
No complaints today.  Pap Smear: Pap smear not completed today. [facility] and normal per patient. Per patient has no history of an abnormal Pap smear. No Pap smear results in EPIC.  Physical exam: Breasts Breasts Symmetrical. Observed two scars left inner breast from history of breast surgery in the past and two scars right upper breast from previous breast abcess per patient. No nipple retraction left breast. Right nipple inverted. Per patient the right nipple inversion has happened since the end of July. No nipple discharge bilateral breasts. No lymphadenopathy. No lumps palpated bilateral breasts. No complaints of pain or tenderness on exam. [facility] per recommendation. Appointment scheduled for Tuesday, June 09, 2015 at 1030.  Pelvic/Bimanual No Pap smear completed today since last Pap smear was April 2014. Pap smear not indicated per BCCCP guidelines.  Used interpreter Nira ConnJulia Sowell.

## 2015-06-09 NOTE — Patient Instructions (Signed)
Educational materials on self breast awareness given. Explained to [facility] Angel[facility] PihValadez Cantin that she did not need a Pap smear today due to last Pap smear was in April 2014 per patient. Let her know BCCCP will cover Pap smears every 3 years unless has a history of abnormal Pap smears. Reminded patient that her next Pap smear is due next April 2017. [facility] for [facility] breast ultrasound per recommendation. Appointment scheduled for Tuesday, June 09, 2015 at 1030. Patient aware of appointment and will be there. Atilano MedianMaria P Angelina PihValadez Bralley verbalized understanding.  Klee Kolek, Kathaleen Maserhristine Poll, RN 9:12 AM

## 2015-06-18 ENCOUNTER — Encounter: Payer: Self-pay | Admitting: Obstetrics & Gynecology

## 2015-06-18 ENCOUNTER — Ambulatory Visit (INDEPENDENT_AMBULATORY_CARE_PROVIDER_SITE_OTHER): Payer: Medicaid Other | Admitting: Obstetrics & Gynecology

## 2015-06-18 VITALS — BP 108/70 | HR 78 | Temp 98.0°F | Wt 144.8 lb

## 2015-06-18 DIAGNOSIS — O99019 Anemia complicating pregnancy, unspecified trimester: Secondary | ICD-10-CM | POA: Diagnosis not present

## 2015-06-18 DIAGNOSIS — D649 Anemia, unspecified: Secondary | ICD-10-CM

## 2015-06-18 DIAGNOSIS — O09522 Supervision of elderly multigravida, second trimester: Secondary | ICD-10-CM | POA: Diagnosis not present

## 2015-06-18 DIAGNOSIS — N611 Abscess of the breast and nipple: Secondary | ICD-10-CM

## 2015-06-18 DIAGNOSIS — O0990 Supervision of high risk pregnancy, unspecified, unspecified trimester: Secondary | ICD-10-CM | POA: Insufficient documentation

## 2015-06-18 DIAGNOSIS — O34219 Maternal care for unspecified type scar from previous cesarean delivery: Secondary | ICD-10-CM | POA: Diagnosis not present

## 2015-06-18 DIAGNOSIS — O162 Unspecified maternal hypertension, second trimester: Secondary | ICD-10-CM | POA: Diagnosis not present

## 2015-06-18 DIAGNOSIS — O10919 Unspecified pre-existing hypertension complicating pregnancy, unspecified trimester: Secondary | ICD-10-CM | POA: Insufficient documentation

## 2015-06-18 LAB — POCT URINALYSIS DIP (DEVICE)
Bilirubin Urine: NEGATIVE
GLUCOSE, UA: NEGATIVE mg/dL
Ketones, ur: NEGATIVE mg/dL
Leukocytes, UA: NEGATIVE
Nitrite: NEGATIVE
PH: 6 (ref 5.0–8.0)
Protein, ur: NEGATIVE mg/dL
Specific Gravity, Urine: 1.02 (ref 1.005–1.030)
UROBILINOGEN UA: 0.2 mg/dL (ref 0.0–1.0)

## 2015-06-18 NOTE — Patient Instructions (Signed)
Hipertensión durante el embarazo  (Hypertension During Pregnancy)  Cuando se sufre hipertensión arterial o presión arterial alta, existe una presión extra en el interior de los vasos sanguíneos que llevan la sangre desde el corazón al resto del cuerpo (arterias). Esto puede suceder en cualquier etapa de la vida, incluido el embarazo. La hipertensión durante el embarazo puede causar problemas para usted y el bebé. Es posible que el bebé no tenga el peso adecuado al nacer o puede que nazca antes de tiempo (prematuro). En los casos muy graves de hipertensión durante el embarazo puede estar en peligro la vida.   Durante el embarazo se pueden presentar diferentes tipos de hipertensión arterial. Estos incluyen:  · Hipertensión crónica. Esto sucede cuando una mujer sufre de hipertensión arterial antes del embarazo y continúa durante el este.  · Hipertensión gestacional. Es cuando se desarrolla la hipertensión durante el embarazo.  · Preeclampsia o toxemia del embarazo. Es un tipo muy grave de hipertensión que se desarrolla solo durante el embarazo. Afecta a todo el cuerpo y puede ser muy peligrosa para la madre y el bebé.  La hipertensión gestacional y preeclampsia por lo general, desaparecen después del nacimiento del bebé. La presión arterial probablemente se estabilizará en un período de 6 semanas. Las mujeres que sufren de hipertensión durante el embarazo tienen una mayor probabilidad de desarrollar hipertensión en etapas posteriores de la vida o en embarazos futuros.  FACTORES DE RIESGO  Existen ciertos factores que aumentan las probabilidades de que desarrolle hipertensión durante el embarazo. Estos incluyen:  · Tener hipertensión arterial antes del embarazo.  · Haber sufrido hipertensión arterial durante un embarazo anterior.  · Tener sobrepeso.  · Ser mayor de 40 años.  · Estar embarazada de más de un bebé.  · Tener diabetes o problemas renales.  SIGNOS Y SÍNTOMAS  La hipertensión arterial gestacional y crónica en  raras ocasiones provoca síntomas. La preeclampsia causa síntomas, que pueden ser:  · Aumento de las proteínas en la orina. El médico la controlará en cada visita prenatal.  · Hinchazón de las manos y la cara.  · Aumento rápido de peso.  · Dolores de cabeza.  · Cambios en la visión.  · Molestias al ver la luz.  · Dolor abdominal, especialmente en el área superior derecha.  · Dolor en el pecho.  · Falta de aire.  · Aumento de los reflejos.  · Convulsiones. Las convulsiones ocurren en una forma más grave de preeclampsia, llamada eclampsia.  DIAGNÓSTICO   Es posible que se le diagnostique hipertensión arterial durante un control prenatal regular. En cada visita prenatal, es posible que le realicen los siguientes exámenes:  · Control de la presión arterial.  · Análisis de orina para detectar proteínas en la orina.  El tipo de hipertensión que se diagnostica depende del momento en que se desarrolló. También depende de la lectura de su presión arterial específica.  · El desarrollo de hipertensión arterial antes de la semana 20 de embarazo es consecuente con la hipertensión arterial crónica.  · El desarrollo de hipertensión arterial después de la semana 20 de embarazo es consecuente con la hipertensión gestacional.  · Hipertensión con aumento de la proteína urinaria se diagnostica como preeclampsia.  · Las mediciones de la presión arterial de más de 160 sistólica o 110 diastólica son un signo de preeclampsia grave.  TRATAMIENTO  El tratamiento para la hipertensión durante el embarazo varía. Depende del tipo de hipertensión y de su gravedad.  · Si toma medicamentos para la hipertensión crónica,   puede que tenga que cambiarlos.    Los medicamentos llamados inhibidores de la ECA no deben tomarse durante el embarazo.    Para las mujeres que tienen factores de riesgo de preeclampsia pueden recomendarse bajas dosis de aspirina.  · Si usted tiene hipertensión gestacional, tendrá que tomar un medicamento para la presión arterial que  sea seguro durante el embarazo. El médico le recomendará el medicamento apropiado.  · Si tiene preeclampsia grave, es posible que tenga que permanecer en el hospital. Los médicos la controlarán a usted y al bebé muy de cerca. Probablemente deba tomar un medicamento denominado sulfato de magnesio para prevenir las convulsiones y reducir la presión arterial.  · A veces es necesario inducir un parto prematuro. Por ejemplo, si la afección empeora. Se hace para protegerlos a usted y a su bebé. La única cura para la preeclampsia es el parto.  · Su médico puede recomendarle que tome una aspirina de dosis baja (81 mg) cada día, a fin de ayudar a prevenir la hipertensión durante el embarazo, si está en riesgo de padecer preeclampsia. Puede estar en riesgo de padecer preeclampsia si:    Padeció preeclampsia o eclampsia durante un embarazo anterior.    Su bebé no creció según lo previsto durante un embarazo anterior.    Tuvo un parto prematuro en un embarazo anterior.    Experimentó una separación de la placenta desde el útero (desprendimiento abrupto de la placenta) durante un embarazo anterior.    Perdió un bebé en un embarazo anterior.    Está embarazada de más de un bebé.    Padece otras afecciones médicas, como diabetes o una enfermedad autoinmunitaria.  INSTRUCCIONES PARA EL CUIDADO EN EL HOGAR  · Programe y concurra a todas las citas de control prenatal regulares. Esto es importante.  · Tome los medicamentos solamente como se lo haya indicado el médico. Dígale a su médico todos los medicamentos que toma.  · Consuma la menor cantidad posible de sal.  · Realice actividad física con regularidad.  · No beba alcohol.  · No fume ni use productos que contengan tabaco.  · No beba productos con cafeína.  · Acuéstese sobre el lado izquierdo cuando haga reposo.  SOLICITE ATENCIÓN MÉDICA DE INMEDIATO SI:  · Siente un dolor abdominal intenso.  · Presenta hinchazón repentina en las manos, los tobillos o el rostro.  · Aumenta más de 4  libras (1,8 kg) en una semana.  · Vomita repetidas veces.  · Tiene una hemorragia vaginal abundante.  · No siente que el bebé se mueva mucho.  · Tiene dolores de cabeza.  · Tiene visión doble o borrosa.  · Tiene calambres o espasmos musculares.  · Le falta el aire.  · Tiene los labios y las uñas de los dedos de las manos de color azul.  · Observa sangre en la orina.  ASEGÚRESE DE QUE:  · Comprende estas instrucciones.  · Controlará su afección.  · Recibirá ayuda de inmediato si no mejora o si empeora.     Esta información no tiene como fin reemplazar el consejo del médico. Asegúrese de hacerle al médico cualquier pregunta que tenga.     Document Released: 06/23/2011 Document Revised: 07/25/2014  Elsevier Interactive Patient Education ©2016 Elsevier Inc.

## 2015-06-18 NOTE — Progress Notes (Signed)
   Subjective:transfer from HD, AMA, HTN    Christy Hernandez is a [id number] being seen today for her first obstetrical visit.  Her obstetrical history is significant for advanced maternal age and HTN, previous cesarean . Patient does intend to breast feed. Pregnancy history fully reviewed.  Patient reports no cramping.  Filed Vitals:   06/18/15 0816  BP: 108/70  Pulse: 78  Temp: 98 F (36.7 C)  Weight: 144 lb 12.8 oz (65.681 kg)    HISTORY: OB History  Gravida Para Term Preterm AB SAB TAB Ectopic Multiple Living  6 4 4  0 1 1 0 0 0 4    # Outcome Date GA Lbr Len/2nd Weight Sex Delivery Anes PTL Lv  6 Current           5 Term 09/20/11 1988w0d 07:58 / 00:08 8 lb 5 oz (3.771 kg) F VBAC None  Y     Comments: wnl  4 Term 12/05/00 4988w0d  7 lb (3.175 kg) F CS-Unspec None N Y  3 Term 05/15/96 2688w0d  8 lb 8 oz (3.856 kg) M Vag-Spont  N Y  2 Term 03/28/94 288w0d  6 lb 8 oz (2.948 kg) F Vag-Spont  N Y  1 SAB              Past Medical History  Diagnosis Date  . No pertinent past medical history   . Hypertension   . Anemia    Past Surgical History  Procedure Laterality Date  . Cesarean section     Family History  Problem Relation Age of Onset  . Hypertension Mother   . Hypertension Father   . Hypertension Sister      Exam    Uterus:  Fundal Height: 16 cm  Pelvic Exam:                               System: Breast:      Skin: normal coloration and turgor, no rashes    Neurologic: oriented, normal mood   Extremities: normal strength, tone, and muscle mass   HEENT extra ocular movement intact   Mouth/Teeth dental hygiene good   Neck supple   Cardiovascular: regular rate and rhythm   Respiratory:  appears well, vitals normal, no respiratory distress, acyanotic, normal RR   Abdomen: soft, non-tender; bowel sounds normal; no masses,  no organomegaly     Assessment:    Pregnancy: [id number] Patient Active Problem List   Diagnosis Date Noted  . Antepartum  multigravida of advanced maternal age 34/07/2014  . Previous cesarean delivery, antepartum condition or complication 06/18/2015  . Hypertension affecting pregnancy in second trimester, antepartum 06/18/2015        Plan:  Stop lisinopril, f/u for BP check   Initial labs drawn. Prenatal vitamins. Problem list reviewed and updated. Genetic Screening discussed counseling recommended  Ultrasound discussed; fetal survey: ordered.  Follow up in 2 weeks. 50% of 30 min visit spent on counseling and coordination of care.  Continue ASA 81 mg   ARNOLD,JAMES 06/18/2015

## 2015-06-18 NOTE — Progress Notes (Signed)
U/S, consult, genetic counseling with MFM 06/26/15 @ 2p.  Spanish interpreter Viviana SimplerAlis Herrera present.

## 2015-06-18 NOTE — Progress Notes (Signed)
Alis used for interpreter  

## 2015-06-19 ENCOUNTER — Encounter: Payer: Self-pay | Admitting: *Deleted

## 2015-06-19 ENCOUNTER — Encounter: Payer: Self-pay | Admitting: Obstetrics & Gynecology

## 2015-06-19 DIAGNOSIS — N611 Abscess of the breast and nipple: Secondary | ICD-10-CM | POA: Insufficient documentation

## 2015-06-19 DIAGNOSIS — O99019 Anemia complicating pregnancy, unspecified trimester: Secondary | ICD-10-CM | POA: Insufficient documentation

## 2015-06-26 ENCOUNTER — Other Ambulatory Visit: Payer: Self-pay | Admitting: Obstetrics & Gynecology

## 2015-06-26 ENCOUNTER — Ambulatory Visit (HOSPITAL_COMMUNITY)
Admission: RE | Admit: 2015-06-26 | Discharge: 2015-06-26 | Disposition: A | Discharge status: Home / Self Care | Payer: Medicaid Other | Source: Ambulatory Visit | Attending: Obstetrics & Gynecology | Admitting: Obstetrics & Gynecology

## 2015-06-26 DIAGNOSIS — Z3A18 18 weeks gestation of pregnancy: Secondary | ICD-10-CM

## 2015-06-26 DIAGNOSIS — N611 Abscess of the breast and nipple: Secondary | ICD-10-CM

## 2015-06-26 DIAGNOSIS — Z3A16 16 weeks gestation of pregnancy: Secondary | ICD-10-CM | POA: Diagnosis not present

## 2015-06-26 DIAGNOSIS — O09522 Supervision of elderly multigravida, second trimester: Secondary | ICD-10-CM

## 2015-06-26 DIAGNOSIS — O162 Unspecified maternal hypertension, second trimester: Secondary | ICD-10-CM

## 2015-06-26 DIAGNOSIS — Z315 Encounter for genetic counseling: Secondary | ICD-10-CM | POA: Diagnosis present

## 2015-06-26 DIAGNOSIS — O34219 Maternal care for unspecified type scar from previous cesarean delivery: Secondary | ICD-10-CM

## 2015-06-26 DIAGNOSIS — O99019 Anemia complicating pregnancy, unspecified trimester: Secondary | ICD-10-CM

## 2015-06-26 DIAGNOSIS — O132 Gestational [pregnancy-induced] hypertension without significant proteinuria, second trimester: Secondary | ICD-10-CM | POA: Insufficient documentation

## 2015-06-26 NOTE — Progress Notes (Signed)
MFM Consultation, Staff Note:  Discussion: I saw Christy Hernandez  in consultation. During our discussion, I reviewed hypertension as a cause of uteroplacental insufficiency, with increased risk of IUGR, oligohydramnios, and stillbirth. I told her that her hypertension also places her at increased risk for preeclampsia, describing the triad of increased blood pressure, proteinuria, and abnormal edema. Lastly, hypertension (severe range) increases the risk of placental abruption, especially in the setting of superimposed preeclampsia.  I reviewed the essential tenets in the most recent guidelines for management of hypertension in pregnancy in accordance with the American College of Obstetrics and Gynecology expert opinion. We talked about the medical treatment of hypertension in pregnancy. I outlined the different classes of medications, emphasizing that angiotensin enzyme inhibitors and angoitensin receptor blockers are contraindicated, and diuretics are relatively contraindicated. I told her that beta-blockers and calcium channel blockers are commonly used to treat hypertension in pregnancy, and that both are felt to be safe for use in pregnancy. Her BP of 121/75 today indicates she does not need medication at this time.  I outlined the usual plan of management for hypertension in pregnancy. She should have her blood pressure carefully followed, and her medications adjusted to keep her BP in the target range of around 140-159/80-105 mm/Hg; ie, HTN should not be treated (no medication adjustment) until severe range blood pressures are noted owing to no known renal or cardiac disease in this patient and to be consistent with current guidelines.  I recommend a baseline 24 hour urine for creatinine clearance and total protein along with baseline CBC/LFT/BMP for comparison should clinical signs or symptoms of preeclampsia be noted later in gestation.   Interval growth will be warranted monthly.  Antenatal testing should begin at 32 weeks with twice weekly non-stress tests with weekly amniotic fluid volume measurement.   Given normal anatomic survey and normal fluid volume with normal appearance of the kidneys of her female fetus, it is unlikely this fetus exposed to lisinopril has renal dysplasia.  Summary of Recommendations: 1. Interval growth q4-6 weeks by ultrasound beginning at 24 weeks (scheduled) 2. Preeclamptic precautions given. BP check in office every two weeks until 32 weeks then twice weekly with NSTs at a minimum. 3. 24 hour urine and baseline preeclampsia labs 4. Twice weekly NSTs and weekly AFIs to begin at 32 weeks 5. Delivery at 38-[redacted] weeks gestation assuming HTN is controlled, growth is AGA, testing is reassuring, and preeclampsia or spontaneous preterm labor does not develop in the interim.  Time Spent: I spent in excess of 30 minutes in consultation with this patient to review records, evaluate her case, and provide her with an adequate discussion and education. More than 50% of this time was spent in direct face-to-face counseling.  It was a pleasure seeing your patient in the office today. Thank you for consultation. Please do not hesitate to contact our service for any further questions.   Thank you,  Louann SjogrenJeffrey Morgan Gaynelle Arabianenney  Denney, Louann SjogrenJeffrey Morgan, MD, MS, FACOG Assistant Professor Section of Maternal-Fetal Medicine [facility]

## 2015-06-26 NOTE — Progress Notes (Signed)
Genetic Counseling  High-Risk Gestation Note  Appointment Date:  06/26/2015 Referred By: Adam Phenix, MD Date of Birth:  08/23/1970   Pregnancy History: [id number] Estimated Date of Delivery: 12/06/15 Estimated Gestational Age: [redacted]w[redacted]d Attending: Damaris Hippo, MD   Ms. Atilano Median Christinia Lambeth was seen for genetic counseling because of a maternal age of 44 y.o..   [employer] Spanish/English interpreter, Elane Fritz, was present for today's visit. Ms. Atilano Median Dalyah Pla was also seen for MFM consultation today regarding medication exposure in pregnancy.   In Summary:   Discussed approximate 1 in 14 risk for fetal aneuploidy related to maternal age of 44 years old  Patient interested in ultrasound only in pregnancy; declined all additional screens/tests for aneuploidy including NIPS, Quad, and amniocentesis  Patient was taking lisinopril until approximately [redacted] weeks gestation; See separate MFM consult note for further discussion  Ultrasound performed today  She was counseled regarding maternal age and the association with risk for chromosome conditions due to nondisjunction with aging of the ova.   We reviewed chromosomes, nondisjunction, and the associated 1 in 14 risk for fetal aneuploidy related to a maternal age of 43 y.o. at [redacted]w[redacted]d gestation.  She was counseled that the risk for aneuploidy decreases as gestational age increases, accounting for those pregnancies which spontaneously abort.  We specifically discussed Down syndrome (trisomy 12), trisomies 31 and 73, and sex chromosome aneuploidies (47,XXX and 47,XXY) including the common features and prognoses of each.   We reviewed available screening options including Quad screen, noninvasive prenatal screening (NIPS)/cell free DNA (cfDNA) testing, and detailed ultrasound.  She was counseled that screening tests are used to modify a patient's a priori risk for aneuploidy, typically based on age. This estimate provides a pregnancy specific risk  assessment. We reviewed the benefits and limitations of each option. Specifically, we discussed the conditions for which each test screens, the detection rates, and false positive rates of each. She was/They were also counseled regarding diagnostic testing via amniocentesis. We reviewed the approximate 1 in 300-500 risk for complications for amniocentesis, including spontaneous pregnancy loss. After consideration of all the options, she elected to proceed with ultrasound only in the pregnancy. She declined all additional screening and testing for fetal aneuploidy (including Quad screen, NIPS, and amniocentesis).     A detailed ultrasound was performed today. The ultrasound report will be sent under separate cover. There were no visualized fetal anomalies or markers suggestive of aneuploidy. She understands that screening tests cannot rule out all birth defects or genetic syndromes. The patient was advised of this limitation and states she still does not want additional testing at this time. Follow-up ultrasounds are scheduled monthly for the patient.   Ms. Lanisa Ishler [facility] records indicate that she had normal hemoglobin electrophoresis in 2012 and a negative CF carrier screen 06/01/15. Thus, her risk to be a carrier for a hemoglobin variant and cystic fibrosis has been reduced.   Both family histories were reviewed and found to be noncontributory for birth defects, intellectual disability, and known genetic conditions. Without further information regarding the provided family history, an accurate genetic risk cannot be calculated. Further genetic counseling is warranted if more information is obtained.  Ms. Atilano Median Kristyne Woodring denied exposure to environmental toxins. She denied the use of alcohol, tobacco or street drugs. She denied significant viral illnesses during the course of her pregnancy. Her medical and surgical histories were contributory for hypertension, for which she was taking  lisinopril. She reported that this medication  was discontinued last Thursday.  See separate MFM consult note from today's visit for detailed discussion.   I counseled Ms. Atilano MedianMaria P Angelina PihValadez Marzella regarding the above risks and available options.  The approximate face-to-face time with the genetic counselor was 45 minutes.  Quinn PlowmanKaren Emilija Bohman, MS,  Certified Genetic Counselor 06/26/2015

## 2015-06-26 NOTE — Consult Note (Signed)
MFM Consultation, Staff Note:  Discussion: I saw Christy Hernandez  in consultation. During our discussion, I reviewed hypertension as a cause of uteroplacental insufficiency, with increased risk of IUGR, oligohydramnios, and stillbirth. I told her that her hypertension also places her at increased risk for preeclampsia, describing the triad of increased blood pressure, proteinuria, and abnormal edema. Lastly, hypertension (severe range) increases the risk of placental abruption, especially in the setting of superimposed preeclampsia.  I reviewed the essential tenets in the most recent guidelines for management of hypertension in pregnancy in accordance with the American College of Obstetrics and Gynecology expert opinion. We talked about the medical treatment of hypertension in pregnancy. I outlined the different classes of medications, emphasizing that angiotensin enzyme inhibitors and angoitensin receptor blockers are contraindicated, and diuretics are relatively contraindicated. I told her that beta-blockers and calcium channel blockers are commonly used to treat hypertension in pregnancy, and that both are felt to be safe for use in pregnancy. Her BP of 121/75 today indicates she does not need medication at this time.  I outlined the usual plan of management for hypertension in pregnancy. She should have her blood pressure carefully followed, and her medications adjusted to keep her BP in the target range of around 140-159/80-105 mm/Hg; ie, HTN should not be treated (no medication adjustment) until severe range blood pressures are noted owing to no known renal or cardiac disease in this patient and to be consistent with current guidelines.  I recommend a baseline 24 hour urine for creatinine clearance and total protein along with baseline CBC/LFT/BMP for comparison should clinical signs or symptoms of preeclampsia be noted later in gestation.   Interval growth will be warranted monthly.  Antenatal testing should begin at 32 weeks with twice weekly non-stress tests with weekly amniotic fluid volume measurement.   Given normal anatomic survey and normal fluid volume with normal appearance of the kidneys of her female fetus, it is unlikely this fetus exposed to lisinopril has renal dysplasia.  Summary of Recommendations: 1. Interval growth q4-6 weeks by ultrasound beginning at 24 weeks (scheduled) 2. Preeclamptic precautions given. BP check in office every two weeks until 32 weeks then twice weekly with NSTs at a minimum. 3. 24 hour urine and baseline preeclampsia labs 4. Twice weekly NSTs and weekly AFIs to begin at 32 weeks 5. Delivery at 38-[redacted] weeks gestation assuming HTN is controlled, growth is AGA, testing is reassuring, and preeclampsia or spontaneous preterm labor does not develop in the interim.  Time Spent: I spent in excess of 30 minutes in consultation with this patient to review records, evaluate her case, and provide her with an adequate discussion and education. More than 50% of this time was spent in direct face-to-face counseling.  It was a pleasure seeing your patient in the office today. Thank you for consultation. Please do not hesitate to contact our service for any further questions.   Thank you,  Tiajah Oyster Morgan Capri Raben  Ura Yingling Morgan, MD, MS, FACOG Assistant Professor Section of Maternal-Fetal Medicine [employer]   

## 2015-07-02 ENCOUNTER — Ambulatory Visit (INDEPENDENT_AMBULATORY_CARE_PROVIDER_SITE_OTHER): Payer: Medicaid Other | Admitting: Certified Nurse Midwife

## 2015-07-02 VITALS — BP 116/75 | HR 76 | Temp 98.3°F | Wt 146.4 lb

## 2015-07-02 DIAGNOSIS — O162 Unspecified maternal hypertension, second trimester: Secondary | ICD-10-CM | POA: Diagnosis not present

## 2015-07-02 DIAGNOSIS — O34219 Maternal care for unspecified type scar from previous cesarean delivery: Secondary | ICD-10-CM

## 2015-07-02 DIAGNOSIS — O09522 Supervision of elderly multigravida, second trimester: Secondary | ICD-10-CM

## 2015-07-02 LAB — POCT URINALYSIS DIP (DEVICE)
BILIRUBIN URINE: NEGATIVE
GLUCOSE, UA: NEGATIVE mg/dL
Ketones, ur: NEGATIVE mg/dL
Leukocytes, UA: NEGATIVE
NITRITE: NEGATIVE
PH: 7 (ref 5.0–8.0)
Protein, ur: NEGATIVE mg/dL
Specific Gravity, Urine: 1.015 (ref 1.005–1.030)
Urobilinogen, UA: 0.2 mg/dL (ref 0.0–1.0)

## 2015-07-02 NOTE — Progress Notes (Signed)
Subjective:  Christy Hernandez is a 44 y.o. [id number] at 5126w4d being seen today for ongoing prenatal care.  She is currently monitored for the following issues for this high-risk pregnancy and has Antepartum multigravida of advanced maternal age; Previous cesarean delivery, antepartum condition or complication; Hypertension affecting pregnancy in second trimester, antepartum; Abscess of right breast; and Anemia in pregnancy on her problem list. Pt will be traveling to [place] 3 weeks for Christmas but will not miss any appointments. She is seeing nutrition and social work today  Patient reports no complaints.  Contractions: Not present. Vag. Bleeding: None.  Movement: Present. Denies leaking of fluid.   The following portions of the patient's history were reviewed and updated as appropriate: allergies, current medications, past family history, past medical history, past social history, past surgical history and problem list. Problem list updated.  Objective:   Filed Vitals:   07/02/15 0810  BP: 116/75  Pulse: 76  Temp: 98.3 F (36.8 C)  Weight: 146 lb 6.4 oz (66.407 kg)    Fetal Status: Fetal Heart Rate (bpm): 141   Movement: Present     General:  Alert, oriented and cooperative. Patient is in no acute distress.  Skin: Skin is warm and dry. No rash noted.   Cardiovascular: Normal heart rate noted  Respiratory: Normal respiratory effort, no problems with respiration noted  Abdomen: Soft, gravid, appropriate for gestational age. Pain/Pressure: Absent     Pelvic: Vag. Bleeding: None     Cervical exam deferred        Extremities: Normal range of motion.  Edema: None  Mental Status: Normal mood and affect. Normal behavior. Normal judgment and thought content.   Urinalysis: Urine Protein: Negative Urine Glucose: Negative  Assessment and Plan:  Pregnancy: [id number] at 8726w4d  1. Hypertension affecting pregnancy in second trimester, antepartum   2. Antepartum multigravida of  advanced maternal age, second trimester   3. Previous cesarean delivery, antepartum condition or complication   Preterm labor symptoms and general obstetric precautions including but not limited to vaginal bleeding, contractions, leaking of fluid and fetal movement were reviewed in detail with the patient. Please refer to After Visit Summary for other counseling recommendations.  Return in about 4 weeks (around 07/30/2015).   Rhea PinkLori A Stanislawa Gaffin, CNM

## 2015-07-02 NOTE — Progress Notes (Signed)
Spanish Interpreter Mariel New AlbinGallego Educated pt on [employer] [place]

## 2015-07-02 NOTE — Patient Instructions (Signed)

## 2015-07-19 NOTE — L&D Delivery Note (Signed)
Delivery Note  Patient presented 5/10 for IOL for cHTN. Ripened with foley, augmented with pitocin. SROM shortly prior to delivery. Precipitous 2nd stage.  At 12:15 AM a viable female was delivered via VBAC, Spontaneous (Presentation: Right Occiput Anterior).  APGAR: 9, 10; weight 7 lb 15.2 oz (3605 g).   Placenta status: Intact, Spontaneous.  Cord: 3 vessels with the following complications: PP hemorrhage.  Cord pH: not obtained  I was called into the patient's room approximately 2 hours after delivery for continued bleeding with fundal checks. EBL not yet estimated. On exam mild/mod amount clots expressed w/ fundal massage. Straight cath returned ~200 cc urine. Manual exploration x1 returned clot, no products. Cytotec 800 buccal given. CBC ordered for this AM.  Anesthesia: None  Episiotomy: None Lacerations: 1st degree Suture Repair: 3.0 vicryl Est. Blood Loss (mL): 350  Mom to postpartum.  Baby to Couplet care / Skin to Skin.  Christy Hernandez 11/26/2015, 1:34 AM

## 2015-07-27 ENCOUNTER — Ambulatory Visit (HOSPITAL_COMMUNITY): Payer: Medicaid Other

## 2015-07-30 ENCOUNTER — Ambulatory Visit (INDEPENDENT_AMBULATORY_CARE_PROVIDER_SITE_OTHER): Payer: Medicaid Other | Admitting: Family Medicine

## 2015-07-30 ENCOUNTER — Encounter: Payer: Self-pay | Admitting: Family Medicine

## 2015-07-30 VITALS — BP 117/59 | HR 80 | Temp 98.2°F | Wt 152.0 lb

## 2015-07-30 DIAGNOSIS — O09522 Supervision of elderly multigravida, second trimester: Secondary | ICD-10-CM | POA: Diagnosis not present

## 2015-07-30 DIAGNOSIS — O09529 Supervision of elderly multigravida, unspecified trimester: Secondary | ICD-10-CM | POA: Insufficient documentation

## 2015-07-30 DIAGNOSIS — O34219 Maternal care for unspecified type scar from previous cesarean delivery: Secondary | ICD-10-CM | POA: Diagnosis not present

## 2015-07-30 DIAGNOSIS — O162 Unspecified maternal hypertension, second trimester: Secondary | ICD-10-CM

## 2015-07-30 DIAGNOSIS — O0992 Supervision of high risk pregnancy, unspecified, second trimester: Secondary | ICD-10-CM

## 2015-07-30 LAB — POCT URINALYSIS DIP (DEVICE)
Bilirubin Urine: NEGATIVE
GLUCOSE, UA: NEGATIVE mg/dL
Ketones, ur: NEGATIVE mg/dL
LEUKOCYTES UA: NEGATIVE
NITRITE: NEGATIVE
Protein, ur: NEGATIVE mg/dL
Specific Gravity, Urine: 1.025 (ref 1.005–1.030)
UROBILINOGEN UA: 0.2 mg/dL (ref 0.0–1.0)
pH: 7 (ref 5.0–8.0)

## 2015-07-30 LAB — COMPREHENSIVE METABOLIC PANEL
ALK PHOS: 43 U/L (ref 33–115)
ALT: 15 U/L (ref 6–29)
AST: 15 U/L (ref 10–35)
Albumin: 3.2 g/dL — ABNORMAL LOW (ref 3.6–5.1)
BUN: 4 mg/dL — AB (ref 7–25)
CO2: 20 mmol/L (ref 20–31)
Calcium: 8.3 mg/dL — ABNORMAL LOW (ref 8.6–10.2)
Chloride: 107 mmol/L (ref 98–110)
Creat: 0.36 mg/dL — ABNORMAL LOW (ref 0.50–1.10)
GLUCOSE: 72 mg/dL (ref 65–99)
POTASSIUM: 4.1 mmol/L (ref 3.5–5.3)
Sodium: 138 mmol/L (ref 135–146)
Total Bilirubin: 0.3 mg/dL (ref 0.2–1.2)
Total Protein: 5.7 g/dL — ABNORMAL LOW (ref 6.1–8.1)

## 2015-07-30 NOTE — Patient Instructions (Signed)
Lactancia materna (Breastfeeding) Decidir amamantar es una de las mejores elecciones que puede hacer por usted y su beb. El cambio hormonal durante el embarazo produce el desarrollo del tejido mamario y aumenta la cantidad y el tamao de los conductos galactforos. Estas hormonas tambin permiten que las protenas, los azcares y las grasas de la sangre produzcan la leche materna en las glndulas productoras de leche. Las hormonas impiden que la leche materna sea liberada antes del nacimiento del beb, adems de impulsar el flujo de leche luego del nacimiento. Una vez que ha comenzado a amamantar, pensar en el beb, as como la succin o el llanto, pueden estimular la liberacin de leche de las glndulas productoras de leche.  LOS BENEFICIOS DE AMAMANTAR Para el beb  La primera leche (calostro) ayuda a mejorar el funcionamiento del sistema digestivo del beb.  La leche tiene anticuerpos que ayudan a prevenir las infecciones en el beb.  El beb tiene una menor incidencia de asma, alergias y del sndrome de muerte sbita del lactante.  Los nutrientes en la leche materna son mejores para el beb que la leche maternizada y estn preparados exclusivamente para cubrir las necesidades del beb.  La leche materna mejora el desarrollo cerebral del beb.  Es menos probable que el beb desarrolle otras enfermedades, como obesidad infantil, asma o diabetes mellitus de tipo 2. Para usted   La lactancia materna favorece el desarrollo de un vnculo muy especial entre la madre y el beb.  Es conveniente. La leche materna siempre est disponible a la temperatura correcta y es econmica.  La lactancia materna ayuda a quemar caloras y a perder el peso ganado durante el embarazo.  Favorece la contraccin del tero al tamao que tena antes del embarazo de manera ms rpida y disminuye el sangrado (loquios) despus del parto.  La lactancia materna contribuye a reducir el riesgo de desarrollar diabetes  mellitus de tipo 2, osteoporosis o cncer de mama o de ovario en el futuro. SIGNOS DE QUE EL BEB EST HAMBRIENTO Primeros signos de hambre  Aumenta su estado de alerta o actividad.  Se estira.  Mueve la cabeza de un lado a otro.  Mueve la cabeza y abre la boca cuando se le toca la mejilla o la comisura de la boca (reflejo de bsqueda).  Aumenta las vocalizaciones, tales como sonidos de succin, se relame los labios, emite arrullos, suspiros, o chirridos.  Mueve la mano hacia la boca.  Se chupa con ganas los dedos o las manos. Signos tardos de hambre  Est agitado.  Llora de manera intermitente. Signos de hambre extrema Los signos de hambre extrema requerirn que lo calme y lo consuele antes de que el beb pueda alimentarse adecuadamente. No espere a que se manifiesten los siguientes signos de hambre extrema para comenzar a amamantar:   Agitacin.  Llanto intenso y fuerte.  Gritos. INFORMACIN BSICA SOBRE LA LACTANCIA MATERNA Iniciacin de la lactancia materna  Encuentre un lugar cmodo para sentarse o acostarse, con un buen respaldo para el cuello y la espalda.  Coloque una almohada o una manta enrollada debajo del beb para acomodarlo a la altura de la mama (si est sentada). Las almohadas para amamantar se han diseado especialmente a fin de servir de apoyo para los brazos y el beb mientras amamanta.  Asegrese de que el abdomen del beb est frente al suyo.   Masajee suavemente la mama. Con las yemas de los dedos, masajee la pared del pecho hacia el pezn en un movimiento circular.   Esto estimula el flujo de North Seekonkleche. Es posible que Engineer, manufacturing systemsdeba continuar este movimiento mientras amamanta si la leche fluye lentamente.  Sostenga la mama con el pulgar por arriba del pezn y los otros 4 dedos por debajo de la mama. Asegrese de que los dedos se encuentren lejos del pezn y de la boca del beb.  Empuje suavemente los labios del beb con el pezn o con el dedo.  Cuando la boca del  beb se abra lo suficiente, acrquelo rpidamente a la mama e introduzca todo el pezn y la zona oscura que lo rodea (areola), tanto como sea posible, dentro de la boca del beb.  Debe haber ms areola visible por arriba del labio superior del beb que por debajo del labio inferior.  La lengua del beb debe estar entre la enca inferior y la Bardwellmama.  Asegrese de que la boca del beb est en la posicin correcta alrededor del pezn (prendida). Los labios del beb deben crear un sello sobre la mama y estar doblados hacia afuera (invertidos).  Es comn que el beb succione durante 2 a 3 minutos para que comience el flujo de Quinnleche materna. Cmo debe prenderse Es muy importante que le ensee al beb cmo prenderse adecuadamente a la mama. Si el beb no se prende adecuadamente, puede causarle dolor en el pezn y reducir la produccin de Paradise Valleyleche materna, y hacer que el beb tenga un escaso aumento de North Tunicapeso. Adems, si el beb no se prende adecuadamente al pezn, puede tragar aire durante la alimentacin. Esto puede causarle molestias al beb. Hacer eructar al beb al Pilar Platecambiar de mama puede ayudarlo a liberar el aire. Sin embargo, ensearle al beb cmo prenderse a la mama adecuadamente es la mejor manera de evitar que se sienta molesto por tragar Oceanographeraire mientras se alimenta. Signos de que el beb se ha prendido adecuadamente al pezn:   Payton Doughtyironea o succiona de modo silencioso, sin causarle dolor.  Se escucha que traga cada 3 o 4 succiones.  Hay movimientos musculares por arriba y por delante de sus odos al Printmakersuccionar. Signos de que el beb no se ha prendido Audiological scientistadecuadamente al pezn:   Hace ruidos de succin o de chasquido mientras se alimenta.  Siente dolor en el pezn. Si cree que el beb no se prendi correctamente, deslice el dedo en la comisura de la boca y Ameren Corporationcolquelo entre las encas del beb para interrumpir la succin. Intente comenzar a amamantar nuevamente. Signos de Fish farm managerlactancia materna exitosa Signos  del beb:   Disminuye gradualmente el nmero de succiones o cesa la succin por completo.  Se duerme.  Relaja el cuerpo.  Retiene una pequea cantidad de Kindred Healthcareleche en la boca.  Se desprende solo del pecho. Signos que presenta usted:  Las mamas han aumentado la firmeza, el peso y el tamao 1 a 3 horas despus de Museum/gallery exhibitions officeramamantar.  Estn ms blandas inmediatamente despus de amamantar.  Un aumento del volumen de Bl[place]leche, y tambin un cambio en su consistencia y color se producen hacia el quinto da de Tour managerlactancia materna.  Los pezones no duelen, ni estn agrietados ni sangran. Signos de que su beb recibe la cantidad de leche suficiente  Moja al menos 3 paales en 24 horas. La orina debe ser clara y de color amarillo plido a los [address]  [address] de [place]ida.  Defeca al menos 3 veces en 24 horas a los [address]  [address] de [address]. La materia fecal debe ser blanda y Grand Detouramarillenta.  Defeca al menos 3 veces en 24 horas a los  7 das de vida. La materia fecal debe ser grumosa y Schram Cityamarillenta.  No registra una prdida de peso mayor del 10% del peso al nacer durante los primeros 3 [address]  [address] de [place]ida.  Aumenta de peso un promedio de 4 a 7onzas (113 a 198g) por semana despus de los [address]  [address] de vida.  Aumenta de Hannasvillepeso, Waterbury Centerdiariamente, de Verdunvillemanera uniforme a Glass blower/designerpartir de los [address]  [address] de vida, sin Passenger transport managerregistrar prdida de peso despus de las 2semanas de vida. Despus de alimentarse, es posible que el beb regurgite una pequea cantidad. Esto es frecuente. FRECUENCIA Y DURACIN DE LA LACTANCIA MATERNA El amamantamiento frecuente la ayudar a producir ms Azerbaijanleche y a Education officer, communityprevenir problemas de Engineer, miningdolor en los pezones e hinchazn en las Wakarusamamas. Alimente al beb cuando muestre signos de hambre o si siente la necesidad de reducir la congestin de las Cabanmamas. Esto se denomina "lactancia a demanda". Evite el uso del chupete mientras trabaja para establecer la lactancia (las primeras 4 a 6 semanas despus del nacimiento del beb). Despus de este perodo, podr ofrecerle un  chupete. Las investigaciones demostraron que el uso del chupete durante el primer ao de vida del beb disminuye el riesgo de desarrollar el sndrome de muerte sbita del lactante (SMSL). Permita que el nio se alimente en cada mama todo lo que desee. Contine amamantando al beb hasta que haya terminado de alimentarse. Cuando el beb se desprende o se queda dormido mientras se est alimentando de la primera mama, ofrzcale la segunda. Debido a que, con frecuencia, los recin Sunoconacidos permanecen somnolientos las primeras semanas de vida, es posible que deba despertar al beb para alimentarlo. Los horarios de Acupuncturistlactancia varan de un beb a otro. Sin embargo, las siguientes reglas pueden servir como gua para ayudarla a Lawyergarantizar que el beb se alimenta adecuadamente:  Se puede amamantar a los recin nacidos (bebs de 4 semanas o menos de vida) cada 1 a 3 horas.  No deben transcurrir ms de 3 horas durante el da o 5 horas durante la noche sin que se amamante a los recin nacidos.  Debe amamantar al beb 8 veces como mnimo en un perodo de 24 horas, hasta que comience a introducir slidos en su dieta, a los 6 meses de vida aproximadamente. EXTRACCIN DE Dean Foods CompanyLECHE MATERNA La extraccin y Contractorel almacenamiento de la leche materna le permiten asegurarse de que el beb se alimente exclusivamente de Glencoeleche materna, aun en momentos en los que no puede amamantar. Esto tiene especial importancia si debe regresar al Aleen Campitrabajo en el perodo en que an est amamantando o si no puede estar presente en los momentos en que el beb debe alimentarse. Su asesor en lactancia puede orientarla sobre cunto tiempo es seguro almacenar Steamboatleche materna.  El sacaleche es un aparato que le permite extraer leche de la mama a un recipiente estril. Luego, la leche materna extrada puede almacenarse en un refrigerador o Electrical engineercongelador. Algunos sacaleches son Birdie Riddlemanuales, Delaney Meigsmientras que otros son elctricos. Consulte a su asesor en lactancia qu tipo ser  ms conveniente para usted. Los sacaleches se pueden comprar; sin embargo, algunos hospitales y grupos de apoyo a la lactancia materna alquilan Sports coachsacaleches mensualmente. Un asesor en lactancia puede ensearle cmo extraer W. R. Berkleyleche materna manualmente, en caso de que prefiera no usar un sacaleche.  CMO CUIDAR LAS MAMAS DURANTE LA LACTANCIA MATERNA Los pezones se secan, agrietan y duelen durante la Tour managerlactancia materna. Las siguientes recomendaciones pueden ayudarla a Pharmacologistmantener las TEPPCO Partnersmamas humectadas y sanas:  Careers information officervite usar jabn en los pezones.  Use un sostn de soporte. Aunque no son esenciales, las camisetas sin mangas o los sostenes especiales para Museum/gallery exhibitions officeramamantar estn diseados para acceder fcilmente a las mamas, para Museum/gallery exhibitions officeramamantar sin tener que quitarse todo el sostn o la camiseta. Evite usar sostenes con aro o sostenes muy ajustados.  Seque al aire sus pezones durante 3 a 4minutos despus de amamantar al beb.  Utilice solo apsitos de Haematologistalgodn en el sostn para Environmental health practitionerabsorber las prdidas de Abseconleche. La prdida de un poco de Public Service Enterprise Groupleche materna entre las tomas es normal.  Utilice lanolina sobre los pezones luego de Museum/gallery exhibitions officeramamantar. La lanolina ayuda a mantener la humedad normal de la piel. Si Botswanausa lanolina pura, no tiene que lavarse los pezones antes de volver a Corporate treasureralimentar al beb. La lanolina pura no es txica para el beb. Adems, puede extraer Beazer Homesmanualmente algunas gotas de Foremanleche materna y Engineer, maintenance (IT)masajear suavemente esa Winn-Dixieleche sobre los pezones, para que la Lake Henryleche se seque al aire. Durante las primeras semanas despus de dar a luz, algunas mujeres pueden experimentar hinchazn en las mamas (congestin Lakes of the Northmamaria). La congestin puede hacer que sienta las mamas pesadas, calientes y sensibles al tacto. El pico de la congestin ocurre dentro de los 3 a 5 das despus del Westonparto. Las siguientes recomendaciones pueden ayudarla a Paramedicaliviar la congestin:  Vace por completo las mamas al QUALCOMMamamantar o Environmental health practitionerextraer leche. Puede aplicar calor hmedo en las mamas  (en la ducha o con toallas hmedas para manos) antes de Museum/gallery exhibitions officeramamantar o extraer WPS Resourcesleche. Esto aumenta la circulacin y Saint Vincent and the Grenadinesayuda a que la Harrisleche fluya. Si el beb no vaca por completo las [address]amas cuando lo [address] Aveamamanta, extraiga la Claremoreleche restante despus de que haya finalizado.  Use un sostn ajustado (para amamantar o comn) o una camiseta sin mangas durante 1 o 2 das para indicar al cuerpo que disminuya ligeramente la produccin de Brushleche.  Aplique compresas de hielo Yahoo! Incsobre las mamas, a menos que le resulte demasiado incmodo.  Asegrese de que el beb est prendido y se encuentre en la posicin correcta mientras lo alimenta. Si la congestin persiste luego de 48 horas o despus de seguir estas recomendaciones, comunquese con su mdico o un Holiday representativeasesor en lactancia. RECOMENDACIONES GENERALES PARA EL CUIDADO DE LA SALUD DURANTE LA LACTANCIA MATERNA  Consuma alimentos saludables. Alterne comidas y colaciones, y coma 3 de cada una por da. Dado que lo que come Danaher Corporationafecta la leche materna, es posible que algunas comidas hagan que su beb se vuelva ms irritable de lo habitual. Evite comer este tipo de alimentos si percibe que afectan de manera negativa al beb.  Beba leche, jugos de fruta y agua para Patent examinersatisfacer su sed (aproximadamente 10 vasos al Futures traderda).  Descanse con frecuencia, reljese y tome sus vitaminas prenatales para evitar la fatiga, el estrs y la anemia.  Contine con los autocontroles de la mama.  Evite Product managermasticar y fumar tabaco. Las sustancias qumicas de los cigarrillos que pasan a la leche materna y la exposicin al humo ambiental del tabaco pueden daar al beb.  No consuma alcohol ni drogas, incluida la marihuana. Algunos medicamentos, que pueden ser perjudiciales para el beb, pueden pasar a travs de la Colgate Palmoliveleche materna. Es importante que consulte a su mdico antes de Medical sales representativetomar cualquier medicamento, incluidos todos los medicamentos recetados y de Mosqueroventa libre, as como los suplementos vitamnicos y  herbales. Puede quedar embarazada durante la lactancia. Si desea controlar la natalidad, consulte a su mdico cules son las opciones ms seguras para el beb. SOLICITE ATENCIN MDICA SI:   Daphane ShepherdUsted  siente que quiere dejar de Museum/gallery exhibitions officer o se siente frustrada con la lactancia.  Siente dolor en las mamas o en los pezones.  Sus pezones estn agrietados o Water quality scientist.  Sus pechos estn irritados, sensibles o calientes.  Tiene un rea hinchada en cualquiera de las mamas.  Siente escalofros o fiebre.  Tiene nuseas o vmitos.  Presenta una secrecin de otro lquido distinto de la leche materna de los pezones.  Sus mamas no se llenan antes de Museum/gallery exhibitions officer al beb para el quinto da despus del Fordoche.  Se siente triste y deprimida.  El beb est demasiado somnoliento como para comer bien.  El beb tiene problemas para dormir.  Moja menos de 3 paales en 24 horas.  Defeca menos de 3 veces en 24 horas.  La piel del beb o la parte blanca de los ojos se vuelven amarillentas.  El beb no ha aumentado de [place] a los [address] de [place]. SOLICITE ATENCIN MDICA DE INMEDIATO SI:   El beb est muy cansado Retail buyer) y no se quiere despertar para comer.  Le sube la fiebre sin causa.   Esta informacin no tiene Theme park manager el consejo del mdico. Asegrese de hacerle al mdico cualquier pregunta que tenga.   Document Released: 07/04/2005 Document Revised: 03/25/2015 Elsevier Interactive Patient Education 2016 Arvin[employer].  Parto vaginal despus de Neomia Dear cesrea (Vaginal Birth After Cesarean Delivery) Un parto vaginal despus de un parto por cesrea es dar a luz por la vagina despus de haber dado a luz por medio de una intervencin [place]. En el pasado, si una mujer tena un beb por cesrea, todos los partos posteriores deban hacerse por cesrea. Esto ya no es as. Puede ser seguro para la mam intentar un parto vaginal luego de una cesrea.  Es importante que converse con su mdico  desde comienzos del Psychiatrist de modo que pueda Google, beneficios y opciones. Le dar tiempo para decidir qu es lo mejor en su caso particular. La decisin final de tener un parto vaginal o por cesrea debe tomarse en conjunto, entre usted y el mdico. Cualquier cambio en su salud o la de su beb durante el embarazo puede ser motivo de un cambio de decisin respecto del parto vaginal.  LAS MUJERES QUE OPTAN POR EL PARTO VAGINAL, DEBEN CONSULTAR AL MDICO PARA ASEGURARSE DE QUE:  La cesrea anterior se haya realizado con un corte (incisin) uterino transversal (no con una incisin vertical clsica).  El canal de parto es lo suficientemente grande como para que pase el [place].  No ha sido sometida a otras operaciones del tero.  Durante el trabajo de parto, le realizarn un monitoreo fetal Forensic scientist, en todo momento.  Habr un quirfano disponible y listo en caso de necesitar una cesrea de emergencia.  Un mdico y personal de quirfano estarn disponibles en todo momento durante el [place] de parto, para realizar una cesrea en caso de ser necesario.  Habr un anestesista disponible en caso de necesitar una cesrea de emergencia.  La nursery est lista y cuenta con personal especializado y el equipo disponible para cuidar al beb en caso de emergencia. BENEFICIOS DEL PARTO VAGINAL:  Permanencia ms breve en el hospital.  Prevencin de los riesgos asociados con el parto por cesrea, por ejemplo:  Complicaciones quirrgicas, como apertura o hernia de la incisin.  Lesiones en otros rganos.  Grant Ruts. Esto puede ocurrir si aparece una infeccin despus de la ciruga. Tambin puede ocurrir como reaccin a los medicamentos administrados para adormecerla  durante la Azerbaijanciruga.  Menos prdida de sangre y menos probabilidad de necesitar una transfusin sangunea.  Menor riesgo de cogulos sanguneos e infeccin.  Tiempo ms corto de recuperacin.  Menor riesgo de remocin del  tero (histerectoma).  Menor riesgo de que la placenta cubra parcial o completamente la abertura del tero (placenta previa) en embarazos futuros.  Menos riesgos en el Beverly Hillstrabajo de parto y Unalaskael parto futuros. RIESGOS  Ruptura del tero. Esto ocurre en menos del 1% de los partos vaginales. El riesgo de que eso suceda es mayor si:  Se toman medidas para iniciar el proceso del Gann Valleytrabajo de parto (inducir Engineer, manufacturing systemsel parto) o Risk managerestimular o intensificar las contracciones (aumentar el trabajo de Augustaparto).  Se usan medicamentos para ablandar (madurar) el cuello del tero.  Es necesario extraer el tero (histerectoma) si se rompe. No debe llevarse a cabo si:  La cesrea previa se realiz con una incisin vertical (clsica) o con forma de T, o usted no sabe cul de Lucent Technologiesellas le han practicado.  Ha sufrido ruptura del tero.  Ha tenido ciertos tipos de Leisure centre managerciruga en el tero, como la extirpacin de fibromas uterinos. Pregntele a su mdico sobre otros tipos de cirugas que le impiden tener un parto vaginal.  Tiene ciertos problemas mdicos o relacionados con el parto (obsttricos).  El beb est en problemas.  Tuvo dos cesreas previas y ningn parto vaginal. OTRAS COSAS QUE DEBE SABER:  La anestesia peridural es segura.  Es seguro dar vuelta al beb si se encuentra de nalgas (intentar una versin ceflica externa).  Es seguro intentarlo en caso de mellizos.  El parto vaginal puede no ser apropiado si el beb pesa 8,8lb (4kg) o ms. Sin embargo, las predicciones de Creightonpeso no son siempre exactas y no deben ser lo nico a tenerse en cuenta para decidir si el parto vaginal es lo indicado para usted.  Hay aumento en el porcentaje de fracasos si el intervalo entre la cesrea y el parto vaginal es de menos de 19 meses.  Su mdico puede aconsejarle no tener un parto vaginal si tiene preeclampsia (hipertensin, protena en la orina e hinchazn en la cara y las extremidades).  El parto vaginal suele ser exitoso si ya  tuvo un parto vaginal previamente.  Tambin suele ser exitoso cuando el trabajo de parto comienza espontneamente antes de la fecha.  El parto vaginal despus de Eustace Quailuna cesrea es similar a un parto espontneo vaginal normal.   Esta informacin no tiene Theme park managercomo fin reemplazar el consejo del mdico. Asegrese de hacerle al mdico cualquier pregunta que tenga.   Document Released: 12/21/2007 Document Revised: 04/24/2013 Elsevier Interactive Patient Education Yahoo! Inc2016 Elsevier Inc.

## 2015-07-30 NOTE — Progress Notes (Signed)
Subjective:  Christy Hernandez is a 45 y.o. [phone] at 3155w1d being seen today for ongoing prenatal care.  She is currently monitored for the following issues for this high-risk pregnancy and has Supervision of high risk pregnancy, antepartum; Previous cesarean delivery, antepartum condition or complication; Hypertension affecting pregnancy in second trimester, antepartum; Abscess of right breast; Anemia in pregnancy; and AMA (advanced maternal age) multigravida 35+ on her problem list.  Patient reports no complaints.  Contractions: Not present. Vag. Bleeding: None.  Movement: Present. Denies leaking of fluid.   The following portions of the patient's history were reviewed and updated as appropriate: allergies, current medications, past family history, past medical history, past social history, past surgical history and problem list. Problem list updated.  Objective:   Filed Vitals:   07/30/15 0826  BP: 117/59  Pulse: 80  Temp: 98.2 F (36.8 C)  Weight: 152 lb (68.947 kg)    Fetal Status: Fetal Heart Rate (bpm): 140 Fundal Height: 22 cm Movement: Present     General:  Alert, oriented and cooperative. Patient is in no acute distress.  Skin: Skin is warm and dry. No rash noted.   Cardiovascular: Normal heart rate noted  Respiratory: Normal respiratory effort, no problems with respiration noted  Abdomen: Soft, gravid, appropriate for gestational age. Pain/Pressure: Absent     Pelvic: Vag. Bleeding: None     Cervical exam deferred        Extremities: Normal range of motion.  Edema: None  Mental Status: Normal mood and affect. Normal behavior. Normal judgment and thought content.   Urinalysis: Urine Protein: Negative Urine Glucose: Negative  Assessment and Plan:  Pregnancy: [id number] at 6155w1d  1. Previous cesarean delivery, antepartum condition or complication One previous VBAC  2. Hypertension affecting pregnancy in second trimester, antepartum BP is controlled without meds  right now - Comprehensive metabolic panel - Protein / creatinine ratio, urine  3. Supervision of high risk pregnancy, antepartum, second trimester Continue prenatal care. F/u u/s scheduled   4. AMA (advanced maternal age) multigravida 35+, second trimester Declines genetics  Preterm labor symptoms and general obstetric precautions including but not limited to vaginal bleeding, contractions, leaking of fluid and fetal movement were reviewed in detail with the patient. Please refer to After Visit Summary for other counseling recommendations.  Return in 4 weeks (on 08/27/2015).   Reva Boresanya S Leilanie Rauda, MD

## 2015-07-30 NOTE — Progress Notes (Signed)
Marly used for interpreter  Reviewed tip of week, patient states she does not plan on breastfeeding

## 2015-07-31 ENCOUNTER — Other Ambulatory Visit (HOSPITAL_COMMUNITY): Payer: Self-pay | Admitting: Obstetrics and Gynecology

## 2015-07-31 ENCOUNTER — Ambulatory Visit (HOSPITAL_COMMUNITY)
Admission: RE | Admit: 2015-07-31 | Discharge: 2015-07-31 | Disposition: A | Discharge status: Home / Self Care | Payer: Medicaid Other | Source: Ambulatory Visit | Attending: Obstetrics and Gynecology | Admitting: Obstetrics and Gynecology

## 2015-07-31 ENCOUNTER — Encounter (HOSPITAL_COMMUNITY): Payer: Self-pay

## 2015-07-31 VITALS — BP 114/55 | HR 79 | Wt 153.0 lb

## 2015-07-31 DIAGNOSIS — O34219 Maternal care for unspecified type scar from previous cesarean delivery: Secondary | ICD-10-CM

## 2015-07-31 DIAGNOSIS — Z36 Encounter for antenatal screening of mother: Secondary | ICD-10-CM | POA: Diagnosis not present

## 2015-07-31 DIAGNOSIS — O09522 Supervision of elderly multigravida, second trimester: Secondary | ICD-10-CM

## 2015-07-31 DIAGNOSIS — O10012 Pre-existing essential hypertension complicating pregnancy, second trimester: Secondary | ICD-10-CM | POA: Diagnosis not present

## 2015-07-31 DIAGNOSIS — Z3A23 23 weeks gestation of pregnancy: Secondary | ICD-10-CM

## 2015-07-31 DIAGNOSIS — O162 Unspecified maternal hypertension, second trimester: Secondary | ICD-10-CM

## 2015-07-31 DIAGNOSIS — IMO0002 Reserved for concepts with insufficient information to code with codable children: Secondary | ICD-10-CM

## 2015-07-31 DIAGNOSIS — Z0489 Encounter for examination and observation for other specified reasons: Secondary | ICD-10-CM

## 2015-07-31 LAB — PROTEIN / CREATININE RATIO, URINE
Creatinine, Urine: 88 mg/dL (ref 20–320)
Protein Creatinine Ratio: 125 mg/g creat (ref 21–161)
Total Protein, Urine: 11 mg/dL (ref 5–24)

## 2015-08-24 ENCOUNTER — Encounter (HOSPITAL_COMMUNITY): Payer: Self-pay

## 2015-08-24 ENCOUNTER — Ambulatory Visit (HOSPITAL_COMMUNITY)
Admission: RE | Admit: 2015-08-24 | Discharge: 2015-08-24 | Disposition: A | Discharge status: Home / Self Care | Payer: Medicaid Other | Source: Ambulatory Visit | Attending: Certified Nurse Midwife | Admitting: Certified Nurse Midwife

## 2015-08-24 DIAGNOSIS — O34219 Maternal care for unspecified type scar from previous cesarean delivery: Secondary | ICD-10-CM | POA: Insufficient documentation

## 2015-08-24 DIAGNOSIS — O10012 Pre-existing essential hypertension complicating pregnancy, second trimester: Secondary | ICD-10-CM | POA: Diagnosis not present

## 2015-08-24 DIAGNOSIS — O162 Unspecified maternal hypertension, second trimester: Secondary | ICD-10-CM

## 2015-08-24 DIAGNOSIS — Z3A26 26 weeks gestation of pregnancy: Secondary | ICD-10-CM | POA: Insufficient documentation

## 2015-08-24 DIAGNOSIS — O09522 Supervision of elderly multigravida, second trimester: Secondary | ICD-10-CM | POA: Insufficient documentation

## 2015-08-27 ENCOUNTER — Ambulatory Visit (INDEPENDENT_AMBULATORY_CARE_PROVIDER_SITE_OTHER): Payer: Medicaid Other | Admitting: Family Medicine

## 2015-08-27 VITALS — BP 119/66 | HR 89 | Temp 98.4°F | Wt 158.1 lb

## 2015-08-27 DIAGNOSIS — Z23 Encounter for immunization: Secondary | ICD-10-CM | POA: Diagnosis not present

## 2015-08-27 DIAGNOSIS — O162 Unspecified maternal hypertension, second trimester: Secondary | ICD-10-CM

## 2015-08-27 DIAGNOSIS — O34219 Maternal care for unspecified type scar from previous cesarean delivery: Secondary | ICD-10-CM

## 2015-08-27 DIAGNOSIS — O09522 Supervision of elderly multigravida, second trimester: Secondary | ICD-10-CM | POA: Diagnosis not present

## 2015-08-27 DIAGNOSIS — D649 Anemia, unspecified: Secondary | ICD-10-CM | POA: Diagnosis not present

## 2015-08-27 DIAGNOSIS — O09523 Supervision of elderly multigravida, third trimester: Secondary | ICD-10-CM

## 2015-08-27 DIAGNOSIS — O99012 Anemia complicating pregnancy, second trimester: Secondary | ICD-10-CM | POA: Diagnosis not present

## 2015-08-27 DIAGNOSIS — O0993 Supervision of high risk pregnancy, unspecified, third trimester: Secondary | ICD-10-CM

## 2015-08-27 DIAGNOSIS — O99013 Anemia complicating pregnancy, third trimester: Secondary | ICD-10-CM

## 2015-08-27 LAB — POCT URINALYSIS DIP (DEVICE)
Bilirubin Urine: NEGATIVE
GLUCOSE, UA: NEGATIVE mg/dL
Hgb urine dipstick: NEGATIVE
KETONES UR: NEGATIVE mg/dL
Nitrite: NEGATIVE
Protein, ur: NEGATIVE mg/dL
SPECIFIC GRAVITY, URINE: 1.015 (ref 1.005–1.030)
UROBILINOGEN UA: 0.2 mg/dL (ref 0.0–1.0)
pH: 6 (ref 5.0–8.0)

## 2015-08-27 LAB — CBC
HEMATOCRIT: 32.8 % — AB (ref 36.0–46.0)
Hemoglobin: 11.2 g/dL — ABNORMAL LOW (ref 12.0–15.0)
MCH: 31.4 pg (ref 26.0–34.0)
MCHC: 34.1 g/dL (ref 30.0–36.0)
MCV: 91.9 fL (ref 78.0–100.0)
MPV: 9.1 fL (ref 8.6–12.4)
Platelets: 262 10*3/uL (ref 150–400)
RBC: 3.57 MIL/uL — AB (ref 3.87–5.11)
RDW: 14.2 % (ref 11.5–15.5)
WBC: 6.5 10*3/uL (ref 4.0–10.5)

## 2015-08-27 MED ORDER — TETANUS-DIPHTH-ACELL PERTUSSIS 5-2.5-18.5 LF-MCG/0.5 IM SUSP
0.5000 mL | Freq: Once | INTRAMUSCULAR | Status: AC
  Administered 2015-08-27: 0.5 mL via INTRAMUSCULAR

## 2015-08-27 NOTE — Progress Notes (Signed)
Breastfeeding tip of the week reviewed TDap today 1hr gtt, 28 week labs today Spanish interpreter used

## 2015-08-27 NOTE — Progress Notes (Signed)
Subjective:  Christy Hernandez is a 45 y.o. [phone] at [redacted]w[redacted]d being seen today for ongoing prenatal care.  She is currently monitored for the following issues for this high-risk pregnancy and has Supervision of high risk pregnancy, antepartum; Previous cesarean delivery, antepartum condition or complication; Hypertension affecting pregnancy in second trimester, antepartum; Abscess of right breast; Anemia in pregnancy; and AMA (advanced maternal age) multigravida 35+ on her problem list.  Patient reports no complaints.  Contractions: Irritability. Vag. Bleeding: None.  Movement: Present. Denies leaking of fluid.   The following portions of the patient's history were reviewed and updated as appropriate: allergies, current medications, past family history, past medical history, past social history, past surgical history and problem list. Problem list updated.  Objective:   Filed Vitals:   08/27/15 0809  BP: 119/66  Pulse: 89  Temp: 98.4 F (36.9 C)  Weight: 158 lb 1.6 oz (71.714 kg)    Fetal Status: Fetal Heart Rate (bpm): 134   Movement: Present     General:  Alert, oriented and cooperative. Patient is in no acute distress.  Skin: Skin is warm and dry. No rash noted.   Cardiovascular: Normal heart rate noted  Respiratory: Normal respiratory effort, no problems with respiration noted  Abdomen: Soft, gravid, appropriate for gestational age. Pain/Pressure: Present     Pelvic: Vag. Bleeding: None     Cervical exam deferred        Extremities: Normal range of motion.  Edema: None  Mental Status: Normal mood and affect. Normal behavior. Normal judgment and thought content.   Urinalysis:      Assessment and Plan:  Pregnancy: [id number] at [redacted]w[redacted]d  1. Hypertension affecting pregnancy in second trimester, antepartum Well controlled, start 2x weely testing at 32 weeks Delivery at 40 weeks unless BP changes (also indicated for AMA > 40yo)  2. AMA (advanced maternal age) multigravida 35+,  third trimester Declined testing, anatomy scan wnl  3. Anemia in pregnancy, third trimester -On Ferrous sulfate, CBC today   4. Previous cesarean delivery, antepartum condition or complication Desires VBAC  5. Supervision of high risk pregnancy, antepartum, third trimester - updated box - Glucose Tolerance, 1 HR (50g) w/o Fasting - Tdap (BOOSTRIX) injection 0.5 mL; Inject 0.5 mLs into the muscle once. - CBC - HIV antibody (with reflex) - RPR  Preterm labor symptoms and general obstetric precautions including but not limited to vaginal bleeding, contractions, leaking of fluid and fetal movement were reviewed in detail with the patient. Please refer to After Visit Summary for other counseling recommendations.  Return in 3 weeks (on 09/17/2015) for Routine PNC.   Federico Flake, MD

## 2015-08-27 NOTE — Patient Instructions (Signed)
Tercer trimestre de embarazo (Third Trimester of Pregnancy) El tercer trimestre comprende desde la semana29 hasta la semana42, es decir, desde el mes7 hasta el mes9. El tercer trimestre es un perodo en el que el feto crece rpidamente. Hacia el final del noveno mes, el feto mide alrededor de 20pulgadas (45cm) de largo y pesa entre 6 y 10 libras (2,700 y 4,500kg).  CAMBIOS EN EL ORGANISMO Su organismo atraviesa por muchos cambios durante el embarazo, y estos varan de una mujer a otra.   Seguir aumentando de peso. Es de esperar que aumente entre 25 y 35libras (11 y 16kg) hacia el final del embarazo.  Podrn aparecer las primeras estras en las caderas, el abdomen y las mamas.  Puede tener necesidad de orinar con ms frecuencia porque el feto baja hacia la pelvis y ejerce presin sobre la vejiga.  Debido al embarazo podr sentir acidez estomacal con frecuencia.  Puede estar estreida, ya que ciertas hormonas enlentecen los movimientos de los msculos que empujan los desechos a travs de los intestinos.  Pueden aparecer hemorroides o abultarse e hincharse las venas (venas varicosas).  Puede sentir dolor plvico debido al aumento de peso y a que las hormonas del embarazo relajan las articulaciones entre los huesos de la pelvis. El dolor de espalda puede ser consecuencia de la sobrecarga de los msculos que soportan la postura.  Tal vez haya cambios en el cabello que pueden incluir su engrosamiento, crecimiento rpido y cambios en la textura. Adems, a algunas mujeres se les cae el cabello durante o despus del embarazo, o tienen el cabello seco o fino. Lo ms probable es que el cabello se le normalice despus del nacimiento del beb.  Las mamas seguirn creciendo y le dolern. A veces, puede haber una secrecin amarilla de las mamas llamada calostro.  El ombligo puede salir hacia afuera.  Puede sentir que le falta el aire debido a que se expande el tero.  Puede notar que el feto  "baja" o lo siente ms bajo, en el abdomen.  Puede tener una prdida de secrecin mucosa con sangre. Esto suele ocurrir en el trmino de unos pocos das a una semana antes de que comience el trabajo de parto.  El cuello del tero se vuelve delgado y blando (se borra) cerca de la fecha de parto. QU DEBE ESPERAR EN LOS EXMENES PRENATALES  Le harn exmenes prenatales cada 2semanas hasta la semana36. A partir de ese momento le harn exmenes semanales. Durante una visita prenatal de rutina:  La pesarn para asegurarse de que usted y el feto estn creciendo normalmente.  Le tomarn la presin arterial.  Le medirn el abdomen para controlar el desarrollo del beb.  Se escucharn los latidos cardacos fetales.  Se evaluarn los resultados de los estudios solicitados en visitas anteriores.  Le revisarn el cuello del tero cuando est prxima la fecha de parto para controlar si este se ha borrado. Alrededor de la semana36, el mdico le revisar el cuello del tero. Al mismo tiempo, realizar un anlisis de las secreciones del tejido vaginal. Este examen es para determinar si hay un tipo de bacteria, estreptococo Grupo B. El mdico le explicar esto con ms detalle. El mdico puede preguntarle lo siguiente:  Cmo le gustara que fuera el parto.  Cmo se siente.  Si siente los movimientos del beb.  Si ha tenido sntomas anormales, como prdida de lquido, sangrado, dolores de cabeza intensos o clicos abdominales.  Si est consumiendo algn producto que contenga tabaco, como cigarrillos, tabaco   de mascar y cigarrillos electrnicos.  Si tiene alguna pregunta. Otros exmenes o estudios de deteccin que pueden realizarse durante el tercer trimestre incluyen lo siguiente:  Anlisis de sangre para controlar los niveles de hierro (anemia).  Controles fetales para determinar su salud, nivel de actividad y crecimiento. Si tiene alguna enfermedad o hay problemas durante el embarazo, le harn  estudios.  Prueba del VIH (virus de inmunodeficiencia humana). Si corre un riesgo alto, pueden realizarle una prueba de deteccin del VIH durante el tercer trimestre del embarazo. FALSO TRABAJO DE PARTO Es posible que sienta contracciones leves e irregulares que finalmente desaparecen. Se llaman contracciones de Braxton Hicks o falso trabajo de parto. Las contracciones pueden durar horas, das o incluso semanas, antes de que el verdadero trabajo de parto se inicie. Si las contracciones ocurren a intervalos regulares, se intensifican o se hacen dolorosas, lo mejor es que la revise el mdico.  SIGNOS DE TRABAJO DE PARTO   Clicos de tipo menstrual.  Contracciones cada 5minutos o menos.  Contracciones que comienzan en la parte superior del tero y se extienden hacia abajo, a la zona inferior del abdomen y la espalda.  Sensacin de mayor presin en la pelvis o dolor de espalda.  Una secrecin de mucosidad acuosa o con sangre que sale de la vagina. Si tiene alguno de estos signos antes de la semana37 del embarazo, llame a su mdico de inmediato. Debe concurrir al hospital para que la controlen inmediatamente. INSTRUCCIONES PARA EL CUIDADO EN EL HOGAR   Evite fumar, consumir hierbas, beber alcohol y tomar frmacos que no le hayan recetado. Estas sustancias qumicas afectan la formacin y el desarrollo del beb.  No consuma ningn producto que contenga tabaco, lo que incluye cigarrillos, tabaco de mascar y cigarrillos electrnicos. Si necesita ayuda para dejar de fumar, consulte al mdico. Puede recibir asesoramiento y otro tipo de recursos para dejar de fumar.  Siga las indicaciones del mdico en relacin con el uso de medicamentos. Durante el embarazo, hay medicamentos que son seguros de tomar y otros que no.  Haga ejercicio solamente como se lo haya indicado el mdico. Sentir clicos uterinos es un buen signo para detener la actividad fsica.  Contine comiendo alimentos sanos con  regularidad.  Use un sostn que le brinde buen soporte si le duelen las mamas.  No se d baos de inmersin en agua caliente, baos turcos ni saunas.  Use el cinturn de seguridad en todo momento mientras conduce.  No coma carne cruda ni queso sin cocinar; evite el contacto con las bandejas sanitarias de los gatos y la tierra que estos animales usan. Estos elementos contienen grmenes que pueden causar defectos congnitos en el beb.  Tome las vitaminas prenatales.  Tome entre 1500 y 2000mg de calcio diariamente comenzando en la semana20 del embarazo hasta el parto.  Si est estreida, pruebe un laxante suave (si el mdico lo autoriza). Consuma ms alimentos ricos en fibra, como vegetales y frutas frescos y cereales integrales. Beba gran cantidad de lquido para mantener la orina de tono claro o color amarillo plido.  Dese baos de asiento con agua tibia para aliviar el dolor o las molestias causadas por las hemorroides. Use una crema para las hemorroides si el mdico la autoriza.  Si tiene venas varicosas, use medias de descanso. Eleve los pies durante 15minutos, 3 o 4veces por da. Limite el consumo de sal en su dieta.  Evite levantar objetos pesados, use zapatos de tacones bajos y mantenga una buena postura.  Descanse   con las piernas elevadas si tiene calambres o dolor de cintura.  Visite a su dentista si no lo ha hecho durante el embarazo. Use un cepillo de dientes blando para higienizarse los dientes y psese el hilo dental con suavidad.  Puede seguir manteniendo relaciones sexuales, a menos que el mdico le indique lo contrario.  No haga viajes largos excepto que sea absolutamente necesario y solo con la autorizacin del mdico.  Tome clases prenatales para entender, practicar y hacer preguntas sobre el trabajo de parto y el parto.  Haga un ensayo de la partida al hospital.  Prepare el bolso que llevar al hospital.  Prepare la habitacin del beb.  Concurra a todas  las visitas prenatales segn las indicaciones de su mdico. SOLICITE ATENCIN MDICA SI:  No est segura de que est en trabajo de parto o de que ha roto la bolsa de las aguas.  Tiene mareos.  Siente clicos leves, presin en la pelvis o dolor persistente en el abdomen.  Tiene nuseas, vmitos o diarrea persistentes.  Observa una secrecin vaginal con mal olor.  Siente dolor al orinar. SOLICITE ATENCIN MDICA DE INMEDIATO SI:   Tiene fiebre.  Tiene una prdida de lquido por la vagina.  Tiene sangrado o pequeas prdidas vaginales.  Siente dolor intenso o clicos en el abdomen.  Sube o baja de peso rpidamente.  Tiene dificultad para respirar y siente dolor de pecho.  Sbitamente se le hinchan mucho el rostro, las manos, los tobillos, los pies o las piernas.  No ha sentido los movimientos del beb durante una hora.  Siente un dolor de cabeza intenso que no se alivia con medicamentos.  Su visin se modifica.   Esta informacin no tiene como fin reemplazar el consejo del mdico. Asegrese de hacerle al mdico cualquier pregunta que tenga.   Document Released: 04/13/2005 Document Revised: 07/25/2014 Elsevier Interactive Patient Education 2016 [employer]  

## 2015-08-28 LAB — RPR

## 2015-08-28 LAB — GLUCOSE TOLERANCE, 1 HOUR (50G) W/O FASTING: Glucose, 1 Hour GTT: 101 mg/dL (ref 70–140)

## 2015-08-28 LAB — HIV ANTIBODY (ROUTINE TESTING W REFLEX): HIV 1&2 Ab, 4th Generation: NONREACTIVE

## 2015-09-17 ENCOUNTER — Ambulatory Visit (INDEPENDENT_AMBULATORY_CARE_PROVIDER_SITE_OTHER): Payer: Medicaid Other | Admitting: Obstetrics & Gynecology

## 2015-09-17 VITALS — BP 121/64 | HR 84 | Temp 98.3°F | Wt 155.6 lb

## 2015-09-17 DIAGNOSIS — O34219 Maternal care for unspecified type scar from previous cesarean delivery: Secondary | ICD-10-CM

## 2015-09-17 DIAGNOSIS — O09523 Supervision of elderly multigravida, third trimester: Secondary | ICD-10-CM

## 2015-09-17 DIAGNOSIS — O10913 Unspecified pre-existing hypertension complicating pregnancy, third trimester: Secondary | ICD-10-CM | POA: Diagnosis present

## 2015-09-17 DIAGNOSIS — O0993 Supervision of high risk pregnancy, unspecified, third trimester: Secondary | ICD-10-CM

## 2015-09-17 LAB — POCT URINALYSIS DIP (DEVICE)
BILIRUBIN URINE: NEGATIVE
GLUCOSE, UA: NEGATIVE mg/dL
Hgb urine dipstick: NEGATIVE
KETONES UR: NEGATIVE mg/dL
Leukocytes, UA: NEGATIVE
Nitrite: NEGATIVE
Protein, ur: NEGATIVE mg/dL
Specific Gravity, Urine: 1.015 (ref 1.005–1.030)
Urobilinogen, UA: 0.2 mg/dL (ref 0.0–1.0)
pH: 7 (ref 5.0–8.0)

## 2015-09-17 NOTE — Patient Instructions (Signed)
Hipertensión durante el embarazo  (Hypertension During Pregnancy)  Cuando se sufre hipertensión arterial o presión arterial alta, existe una presión extra en el interior de los vasos sanguíneos que llevan la sangre desde el corazón al resto del cuerpo (arterias). Esto puede suceder en cualquier etapa de la vida, incluido el embarazo. La hipertensión durante el embarazo puede causar problemas para usted y el bebé. Es posible que el bebé no tenga el peso adecuado al nacer o puede que nazca antes de tiempo (prematuro). En los casos muy graves de hipertensión durante el embarazo puede estar en peligro la vida.   Durante el embarazo se pueden presentar diferentes tipos de hipertensión arterial. Estos incluyen:  · Hipertensión crónica. Esto sucede cuando una mujer sufre de hipertensión arterial antes del embarazo y continúa durante el este.  · Hipertensión gestacional. Es cuando se desarrolla la hipertensión durante el embarazo.  · Preeclampsia o toxemia del embarazo. Es un tipo muy grave de hipertensión que se desarrolla solo durante el embarazo. Afecta a todo el cuerpo y puede ser muy peligrosa para la madre y el bebé.  La hipertensión gestacional y preeclampsia por lo general, desaparecen después del nacimiento del bebé. La presión arterial probablemente se estabilizará en un período de 6 semanas. Las mujeres que sufren de hipertensión durante el embarazo tienen una mayor probabilidad de desarrollar hipertensión en etapas posteriores de la vida o en embarazos futuros.  FACTORES DE RIESGO  Existen ciertos factores que aumentan las probabilidades de que desarrolle hipertensión durante el embarazo. Estos incluyen:  · Tener hipertensión arterial antes del embarazo.  · Haber sufrido hipertensión arterial durante un embarazo anterior.  · Tener sobrepeso.  · Ser mayor de 40 años.  · Estar embarazada de más de un bebé.  · Tener diabetes o problemas renales.  SIGNOS Y SÍNTOMAS  La hipertensión arterial gestacional y crónica en  raras ocasiones provoca síntomas. La preeclampsia causa síntomas, que pueden ser:  · Aumento de las proteínas en la orina. El médico la controlará en cada visita prenatal.  · Hinchazón de las manos y la cara.  · Aumento rápido de peso.  · Dolores de cabeza.  · Cambios en la visión.  · Molestias al ver la luz.  · Dolor abdominal, especialmente en el área superior derecha.  · Dolor en el pecho.  · Falta de aire.  · Aumento de los reflejos.  · Convulsiones. Las convulsiones ocurren en una forma más grave de preeclampsia, llamada eclampsia.  DIAGNÓSTICO   Es posible que se le diagnostique hipertensión arterial durante un control prenatal regular. En cada visita prenatal, es posible que le realicen los siguientes exámenes:  · Control de la presión arterial.  · Análisis de orina para detectar proteínas en la orina.  El tipo de hipertensión que se diagnostica depende del momento en que se desarrolló. También depende de la lectura de su presión arterial específica.  · El desarrollo de hipertensión arterial antes de la semana 20 de embarazo es consecuente con la hipertensión arterial crónica.  · El desarrollo de hipertensión arterial después de la semana 20 de embarazo es consecuente con la hipertensión gestacional.  · Hipertensión con aumento de la proteína urinaria se diagnostica como preeclampsia.  · Las mediciones de la presión arterial de más de 160 sistólica o 110 diastólica son un signo de preeclampsia grave.  TRATAMIENTO  El tratamiento para la hipertensión durante el embarazo varía. Depende del tipo de hipertensión y de su gravedad.  · Si toma medicamentos para la hipertensión crónica,   puede que tenga que cambiarlos.    Los medicamentos llamados inhibidores de la ECA no deben tomarse durante el embarazo.    Para las mujeres que tienen factores de riesgo de preeclampsia pueden recomendarse bajas dosis de aspirina.  · Si usted tiene hipertensión gestacional, tendrá que tomar un medicamento para la presión arterial que  sea seguro durante el embarazo. El médico le recomendará el medicamento apropiado.  · Si tiene preeclampsia grave, es posible que tenga que permanecer en el hospital. Los médicos la controlarán a usted y al bebé muy de cerca. Probablemente deba tomar un medicamento denominado sulfato de magnesio para prevenir las convulsiones y reducir la presión arterial.  · A veces es necesario inducir un parto prematuro. Por ejemplo, si la afección empeora. Se hace para protegerlos a usted y a su bebé. La única cura para la preeclampsia es el parto.  · Su médico puede recomendarle que tome una aspirina de dosis baja (81 mg) cada día, a fin de ayudar a prevenir la hipertensión durante el embarazo, si está en riesgo de padecer preeclampsia. Puede estar en riesgo de padecer preeclampsia si:    Padeció preeclampsia o eclampsia durante un embarazo anterior.    Su bebé no creció según lo previsto durante un embarazo anterior.    Tuvo un parto prematuro en un embarazo anterior.    Experimentó una separación de la placenta desde el útero (desprendimiento abrupto de la placenta) durante un embarazo anterior.    Perdió un bebé en un embarazo anterior.    Está embarazada de más de un bebé.    Padece otras afecciones médicas, como diabetes o una enfermedad autoinmunitaria.  INSTRUCCIONES PARA EL CUIDADO EN EL HOGAR  · Programe y concurra a todas las citas de control prenatal regulares. Esto es importante.  · Tome los medicamentos solamente como se lo haya indicado el médico. Dígale a su médico todos los medicamentos que toma.  · Consuma la menor cantidad posible de sal.  · Realice actividad física con regularidad.  · No beba alcohol.  · No fume ni use productos que contengan tabaco.  · No beba productos con cafeína.  · Acuéstese sobre el lado izquierdo cuando haga reposo.  SOLICITE ATENCIÓN MÉDICA DE INMEDIATO SI:  · Siente un dolor abdominal intenso.  · Presenta hinchazón repentina en las manos, los tobillos o el rostro.  · Aumenta más de 4  libras (1,8 kg) en una semana.  · Vomita repetidas veces.  · Tiene una hemorragia vaginal abundante.  · No siente que el bebé se mueva mucho.  · Tiene dolores de cabeza.  · Tiene visión doble o borrosa.  · Tiene calambres o espasmos musculares.  · Le falta el aire.  · Tiene los labios y las uñas de los dedos de las manos de color azul.  · Observa sangre en la orina.  ASEGÚRESE DE QUE:  · Comprende estas instrucciones.  · Controlará su afección.  · Recibirá ayuda de inmediato si no mejora o si empeora.     Esta información no tiene como fin reemplazar el consejo del médico. Asegúrese de hacerle al médico cualquier pregunta que tenga.     Document Released: 06/23/2011 Document Revised: 07/25/2014  Elsevier Interactive Patient Education ©2016 Elsevier Inc.

## 2015-09-17 NOTE — Progress Notes (Signed)
BTS paperwork completed. Copy to the patient. 

## 2015-09-17 NOTE — Progress Notes (Signed)
Used Delphina Cahill, Certified Interpreter.

## 2015-09-17 NOTE — Progress Notes (Signed)
Subjective:  Christy Hernandez is a 45 y.o. [phone] at [redacted]w[redacted]d being seen today for ongoing prenatal care.  She is currently monitored for the following issues for this high-risk pregnancy and has Supervision of high risk pregnancy, antepartum; Previous cesarean delivery, antepartum condition or complication; Preexisting hypertension complicating pregnancy, antepartum; Abscess of right breast; Anemia in pregnancy; and AMA (advanced maternal age) multigravida 35+ on her problem list. Patient is Spanish-speaking only, Spanish interpreter present for this encounter.   Patient reports no complaints.  Contractions: Not present. Vag. Bleeding: None.  Movement: Present. Denies leaking of fluid.   The following portions of the patient's history were reviewed and updated as appropriate: allergies, current medications, past family history, past medical history, past social history, past surgical history and problem list. Problem list updated.  Objective:   Filed Vitals:   09/17/15 0820  BP: 121/64  Pulse: 84  Temp: 98.3 F (36.8 C)  Weight: 155 lb 9.6 oz (70.58 kg)    Fetal Status: Fetal Heart Rate (bpm): 136 Fundal Height: 30 cm Movement: Present     General:  Alert, oriented and cooperative. Patient is in no acute distress.  Skin: Skin is warm and dry. No rash noted.   Cardiovascular: Normal heart rate noted  Respiratory: Normal respiratory effort, no problems with respiration noted  Abdomen: Soft, gravid, appropriate for gestational age. Pain/Pressure: Absent     Pelvic: Vag. Bleeding: None    Cervical exam deferred        Extremities: Normal range of motion.  Edema: None  Mental Status: Normal mood and affect. Normal behavior. Normal judgment and thought content.   Urinalysis: Urine Protein: Negative Urine Glucose: Negative  Assessment and Plan:  Pregnancy: [id number] at [redacted]w[redacted]d  1. Preexisting hypertension complicating pregnancy, antepartum, third trimester Normal BP. Will start  antenatal testing at next visit. Growth scan scheduled 09/21/15. Continue Aspirin/  2. Previous cesarean delivery, antepartum condition or complication Counseled about risks of TOLAC, consent signed  3. Supervision of high risk pregnancy, antepartum, third trimester Signed Medicaid papers today Preterm labor symptoms and general obstetric precautions including but not limited to vaginal bleeding, contractions, leaking of fluid and fetal movement were reviewed in detail with the patient. Please refer to After Visit Summary for other counseling recommendations.  Return for OB Visit, NST (start 2x/week testing).   Tereso Newcomer, MD

## 2015-09-18 ENCOUNTER — Encounter: Payer: Self-pay | Admitting: *Deleted

## 2015-09-21 ENCOUNTER — Ambulatory Visit (HOSPITAL_COMMUNITY)
Admission: RE | Admit: 2015-09-21 | Discharge: 2015-09-21 | Disposition: A | Discharge status: Home / Self Care | Payer: Medicaid Other | Source: Ambulatory Visit | Attending: Obstetrics & Gynecology | Admitting: Obstetrics & Gynecology

## 2015-09-21 ENCOUNTER — Other Ambulatory Visit (HOSPITAL_COMMUNITY): Payer: Self-pay | Admitting: Obstetrics and Gynecology

## 2015-09-21 ENCOUNTER — Encounter (HOSPITAL_COMMUNITY): Payer: Self-pay

## 2015-09-21 DIAGNOSIS — O10013 Pre-existing essential hypertension complicating pregnancy, third trimester: Secondary | ICD-10-CM | POA: Insufficient documentation

## 2015-09-21 DIAGNOSIS — O09523 Supervision of elderly multigravida, third trimester: Secondary | ICD-10-CM | POA: Diagnosis not present

## 2015-09-21 DIAGNOSIS — Z3A30 30 weeks gestation of pregnancy: Secondary | ICD-10-CM | POA: Diagnosis not present

## 2015-09-21 DIAGNOSIS — O162 Unspecified maternal hypertension, second trimester: Secondary | ICD-10-CM

## 2015-09-21 DIAGNOSIS — O10019 Pre-existing essential hypertension complicating pregnancy, unspecified trimester: Secondary | ICD-10-CM

## 2015-09-21 DIAGNOSIS — O34219 Maternal care for unspecified type scar from previous cesarean delivery: Secondary | ICD-10-CM

## 2015-09-23 ENCOUNTER — Telehealth: Payer: Self-pay | Admitting: *Deleted

## 2015-09-23 NOTE — Telephone Encounter (Signed)
Called pt w/[employer] # [phone] and left message stating that she will not need appt as scheduled tomorrow. We have changed her appt to 3/16 @ 0730. She may call back if she has questions.

## 2015-09-24 ENCOUNTER — Other Ambulatory Visit: Payer: Medicaid Other

## 2015-10-01 ENCOUNTER — Ambulatory Visit (INDEPENDENT_AMBULATORY_CARE_PROVIDER_SITE_OTHER): Payer: Medicaid Other | Admitting: Obstetrics & Gynecology

## 2015-10-01 VITALS — BP 108/66 | HR 87 | Wt 159.7 lb

## 2015-10-01 DIAGNOSIS — O09523 Supervision of elderly multigravida, third trimester: Secondary | ICD-10-CM | POA: Diagnosis present

## 2015-10-01 DIAGNOSIS — O10913 Unspecified pre-existing hypertension complicating pregnancy, third trimester: Secondary | ICD-10-CM | POA: Diagnosis not present

## 2015-10-01 LAB — POCT URINALYSIS DIP (DEVICE)
BILIRUBIN URINE: NEGATIVE
Glucose, UA: NEGATIVE mg/dL
HGB URINE DIPSTICK: NEGATIVE
Ketones, ur: NEGATIVE mg/dL
Nitrite: NEGATIVE
PH: 7 (ref 5.0–8.0)
PROTEIN: NEGATIVE mg/dL
Specific Gravity, Urine: 1.02 (ref 1.005–1.030)
Urobilinogen, UA: 0.2 mg/dL (ref 0.0–1.0)

## 2015-10-01 NOTE — Progress Notes (Signed)
US 3/6 nl growth Subjective:  Atilano MedianMaria P Angelina PihValadez Barrett is a 45 y.o. [phone] at 3168w1d being seen today for ongoing prenatal care.  She is currently monitored for the following issues for this high-risk pregnancy and has Supervision of high risk pregnancy, antepartum; Previous cesarean delivery, antepartum condition or complication; Preexisting hypertension complicating pregnancy, antepartum; Abscess of right breast; Anemia in pregnancy; and AMA (advanced maternal age) multigravida 35+ on her problem list.  Patient reports no complaints.  Contractions: Not present. Vag. Bleeding: None.  Movement: Present. Denies leaking of fluid.   The following portions of the patient's history were reviewed and updated as appropriate: allergies, current medications, past family history, past medical history, past social history, past surgical history and problem list. Problem list updated.  Objective:   Filed Vitals:   10/01/15 0755  BP: 108/66  Pulse: 87  Weight: 159 lb 11.2 oz (72.439 kg)    Fetal Status: Fetal Heart Rate (bpm): NST   Movement: Present     General:  Alert, oriented and cooperative. Patient is in no acute distress.  Skin: Skin is warm and dry. No rash noted.   Cardiovascular: Normal heart rate noted  Respiratory: Normal respiratory effort, no problems with respiration noted  Abdomen: Soft, gravid, appropriate for gestational age. Pain/Pressure: Present     Pelvic: Vag. Bleeding: None     Cervical exam deferred        Extremities: Normal range of motion.  Edema: None  Mental Status: Normal mood and affect. Normal behavior. Normal judgment and thought content.   Urinalysis:      Assessment and Plan:  Pregnancy: [id number] at 6068w1d  1. AMA (advanced maternal age) multigravida 35+, third trimester Reactive NST - Fetal nonstress test  2. Preexisting hypertension complicating pregnancy, antepartum, third trimester Nl BP no medications - Fetal nonstress test Continue testing and US as  scheduled Preterm labor symptoms and general obstetric precautions including but not limited to vaginal bleeding, contractions, leaking of fluid and fetal movement were reviewed in detail with the patient. Please refer to After Visit Summary for other counseling recommendations.  Return in about 4 days (around 10/05/2015).   Adam PhenixJames G Cecille Mcclusky, MD

## 2015-10-01 NOTE — Patient Instructions (Signed)
Tercer trimestre de embarazo (Third Trimester of Pregnancy) El tercer trimestre comprende desde la semana29 hasta la semana42, es decir, desde el mes7 hasta el mes9. El tercer trimestre es un perodo en el que el feto crece rpidamente. Hacia el final del noveno mes, el feto mide alrededor de 20pulgadas (45cm) de largo y pesa entre 6 y 10 libras (2,700 y 4,500kg).  CAMBIOS EN EL ORGANISMO Su organismo atraviesa por muchos cambios durante el embarazo, y estos varan de una mujer a otra.   Seguir aumentando de peso. Es de esperar que aumente entre 25 y 35libras (11 y 16kg) hacia el final del embarazo.  Podrn aparecer las primeras estras en las caderas, el abdomen y las mamas.  Puede tener necesidad de orinar con ms frecuencia porque el feto baja hacia la pelvis y ejerce presin sobre la vejiga.  Debido al embarazo podr sentir acidez estomacal con frecuencia.  Puede estar estreida, ya que ciertas hormonas enlentecen los movimientos de los msculos que empujan los desechos a travs de los intestinos.  Pueden aparecer hemorroides o abultarse e hincharse las venas (venas varicosas).  Puede sentir dolor plvico debido al aumento de peso y a que las hormonas del embarazo relajan las articulaciones entre los huesos de la pelvis. El dolor de espalda puede ser consecuencia de la sobrecarga de los msculos que soportan la postura.  Tal vez haya cambios en el cabello que pueden incluir su engrosamiento, crecimiento rpido y cambios en la textura. Adems, a algunas mujeres se les cae el cabello durante o despus del embarazo, o tienen el cabello seco o fino. Lo ms probable es que el cabello se le normalice despus del nacimiento del beb.  Las mamas seguirn creciendo y le dolern. A veces, puede haber una secrecin amarilla de las mamas llamada calostro.  El ombligo puede salir hacia afuera.  Puede sentir que le falta el aire debido a que se expande el tero.  Puede notar que el feto  "baja" o lo siente ms bajo, en el abdomen.  Puede tener una prdida de secrecin mucosa con sangre. Esto suele ocurrir en el trmino de unos pocos das a una semana antes de que comience el trabajo de parto.  El cuello del tero se vuelve delgado y blando (se borra) cerca de la fecha de parto. QU DEBE ESPERAR EN LOS EXMENES PRENATALES  Le harn exmenes prenatales cada 2semanas hasta la semana36. A partir de ese momento le harn exmenes semanales. Durante una visita prenatal de rutina:  La pesarn para asegurarse de que usted y el feto estn creciendo normalmente.  Le tomarn la presin arterial.  Le medirn el abdomen para controlar el desarrollo del beb.  Se escucharn los latidos cardacos fetales.  Se evaluarn los resultados de los estudios solicitados en visitas anteriores.  Le revisarn el cuello del tero cuando est prxima la fecha de parto para controlar si este se ha borrado. Alrededor de la semana36, el mdico le revisar el cuello del tero. Al mismo tiempo, realizar un anlisis de las secreciones del tejido vaginal. Este examen es para determinar si hay un tipo de bacteria, estreptococo Grupo B. El mdico le explicar esto con ms detalle. El mdico puede preguntarle lo siguiente:  Cmo le gustara que fuera el parto.  Cmo se siente.  Si siente los movimientos del beb.  Si ha tenido sntomas anormales, como prdida de lquido, sangrado, dolores de cabeza intensos o clicos abdominales.  Si est consumiendo algn producto que contenga tabaco, como cigarrillos, tabaco   de mascar y cigarrillos electrnicos.  Si tiene alguna pregunta. Otros exmenes o estudios de deteccin que pueden realizarse durante el tercer trimestre incluyen lo siguiente:  Anlisis de sangre para controlar los niveles de hierro (anemia).  Controles fetales para determinar su salud, nivel de actividad y crecimiento. Si tiene alguna enfermedad o hay problemas durante el embarazo, le harn  estudios.  Prueba del VIH (virus de inmunodeficiencia humana). Si corre un riesgo alto, pueden realizarle una prueba de deteccin del VIH durante el tercer trimestre del embarazo. FALSO TRABAJO DE PARTO Es posible que sienta contracciones leves e irregulares que finalmente desaparecen. Se llaman contracciones de Braxton Hicks o falso trabajo de parto. Las contracciones pueden durar horas, das o incluso semanas, antes de que el verdadero trabajo de parto se inicie. Si las contracciones ocurren a intervalos regulares, se intensifican o se hacen dolorosas, lo mejor es que la revise el mdico.  SIGNOS DE TRABAJO DE PARTO   Clicos de tipo menstrual.  Contracciones cada 5minutos o menos.  Contracciones que comienzan en la parte superior del tero y se extienden hacia abajo, a la zona inferior del abdomen y la espalda.  Sensacin de mayor presin en la pelvis o dolor de espalda.  Una secrecin de mucosidad acuosa o con sangre que sale de la vagina. Si tiene alguno de estos signos antes de la semana37 del embarazo, llame a su mdico de inmediato. Debe concurrir al hospital para que la controlen inmediatamente. INSTRUCCIONES PARA EL CUIDADO EN EL HOGAR   Evite fumar, consumir hierbas, beber alcohol y tomar frmacos que no le hayan recetado. Estas sustancias qumicas afectan la formacin y el desarrollo del beb.  No consuma ningn producto que contenga tabaco, lo que incluye cigarrillos, tabaco de mascar y cigarrillos electrnicos. Si necesita ayuda para dejar de fumar, consulte al mdico. Puede recibir asesoramiento y otro tipo de recursos para dejar de fumar.  Siga las indicaciones del mdico en relacin con el uso de medicamentos. Durante el embarazo, hay medicamentos que son seguros de tomar y otros que no.  Haga ejercicio solamente como se lo haya indicado el mdico. Sentir clicos uterinos es un buen signo para detener la actividad fsica.  Contine comiendo alimentos sanos con  regularidad.  Use un sostn que le brinde buen soporte si le duelen las mamas.  No se d baos de inmersin en agua caliente, baos turcos ni saunas.  Use el cinturn de seguridad en todo momento mientras conduce.  No coma carne cruda ni queso sin cocinar; evite el contacto con las bandejas sanitarias de los gatos y la tierra que estos animales usan. Estos elementos contienen grmenes que pueden causar defectos congnitos en el beb.  Tome las vitaminas prenatales.  Tome entre 1500 y 2000mg de calcio diariamente comenzando en la semana20 del embarazo hasta el parto.  Si est estreida, pruebe un laxante suave (si el mdico lo autoriza). Consuma ms alimentos ricos en fibra, como vegetales y frutas frescos y cereales integrales. Beba gran cantidad de lquido para mantener la orina de tono claro o color amarillo plido.  Dese baos de asiento con agua tibia para aliviar el dolor o las molestias causadas por las hemorroides. Use una crema para las hemorroides si el mdico la autoriza.  Si tiene venas varicosas, use medias de descanso. Eleve los pies durante 15minutos, 3 o 4veces por da. Limite el consumo de sal en su dieta.  Evite levantar objetos pesados, use zapatos de tacones bajos y mantenga una buena postura.  Descanse   con las piernas elevadas si tiene calambres o dolor de cintura.  Visite a su dentista si no lo ha hecho durante el embarazo. Use un cepillo de dientes blando para higienizarse los dientes y psese el hilo dental con suavidad.  Puede seguir manteniendo relaciones sexuales, a menos que el mdico le indique lo contrario.  No haga viajes largos excepto que sea absolutamente necesario y solo con la autorizacin del mdico.  Tome clases prenatales para entender, practicar y hacer preguntas sobre el trabajo de parto y el parto.  Haga un ensayo de la partida al hospital.  Prepare el bolso que llevar al hospital.  Prepare la habitacin del beb.  Concurra a todas  las visitas prenatales segn las indicaciones de su mdico. SOLICITE ATENCIN MDICA SI:  No est segura de que est en trabajo de parto o de que ha roto la bolsa de las aguas.  Tiene mareos.  Siente clicos leves, presin en la pelvis o dolor persistente en el abdomen.  Tiene nuseas, vmitos o diarrea persistentes.  Observa una secrecin vaginal con mal olor.  Siente dolor al orinar. SOLICITE ATENCIN MDICA DE INMEDIATO SI:   Tiene fiebre.  Tiene una prdida de lquido por la vagina.  Tiene sangrado o pequeas prdidas vaginales.  Siente dolor intenso o clicos en el abdomen.  Sube o baja de peso rpidamente.  Tiene dificultad para respirar y siente dolor de pecho.  Sbitamente se le hinchan mucho el rostro, las manos, los tobillos, los pies o las piernas.  No ha sentido los movimientos del beb durante una hora.  Siente un dolor de cabeza intenso que no se alivia con medicamentos.  Su visin se modifica.   Esta informacin no tiene como fin reemplazar el consejo del mdico. Asegrese de hacerle al mdico cualquier pregunta que tenga.   Document Released: 04/13/2005 Document Revised: 07/25/2014 Elsevier Interactive Patient Education 2016 [employer]  

## 2015-10-01 NOTE — Progress Notes (Signed)
Pt wants to know if she should continue taking iron 3 times daily.

## 2015-10-05 ENCOUNTER — Ambulatory Visit (INDEPENDENT_AMBULATORY_CARE_PROVIDER_SITE_OTHER): Payer: Medicaid Other | Admitting: *Deleted

## 2015-10-05 VITALS — BP 120/76 | HR 82

## 2015-10-05 DIAGNOSIS — Z36 Encounter for antenatal screening of mother: Secondary | ICD-10-CM | POA: Diagnosis not present

## 2015-10-05 DIAGNOSIS — O09523 Supervision of elderly multigravida, third trimester: Secondary | ICD-10-CM | POA: Diagnosis not present

## 2015-10-05 DIAGNOSIS — O10913 Unspecified pre-existing hypertension complicating pregnancy, third trimester: Secondary | ICD-10-CM

## 2015-10-05 NOTE — Progress Notes (Signed)
NST reactive.

## 2015-10-08 ENCOUNTER — Ambulatory Visit (INDEPENDENT_AMBULATORY_CARE_PROVIDER_SITE_OTHER): Payer: Medicaid Other | Admitting: Obstetrics & Gynecology

## 2015-10-08 VITALS — BP 111/70 | HR 90 | Wt 162.5 lb

## 2015-10-08 DIAGNOSIS — O0993 Supervision of high risk pregnancy, unspecified, third trimester: Secondary | ICD-10-CM | POA: Diagnosis not present

## 2015-10-08 DIAGNOSIS — O09523 Supervision of elderly multigravida, third trimester: Secondary | ICD-10-CM | POA: Diagnosis not present

## 2015-10-08 DIAGNOSIS — O10913 Unspecified pre-existing hypertension complicating pregnancy, third trimester: Secondary | ICD-10-CM | POA: Diagnosis not present

## 2015-10-08 LAB — POCT URINALYSIS DIP (DEVICE)
Bilirubin Urine: NEGATIVE
GLUCOSE, UA: NEGATIVE mg/dL
Ketones, ur: NEGATIVE mg/dL
Leukocytes, UA: NEGATIVE
NITRITE: NEGATIVE
Protein, ur: NEGATIVE mg/dL
SPECIFIC GRAVITY, URINE: 1.02 (ref 1.005–1.030)
UROBILINOGEN UA: 0.2 mg/dL (ref 0.0–1.0)
pH: 7.5 (ref 5.0–8.0)

## 2015-10-08 NOTE — Patient Instructions (Signed)
Regrese a la clinica cuando tenga su cita. Si tiene problemas o preguntas, llama a la clinica o vaya a la sala de emergencia al [facility].    

## 2015-10-08 NOTE — Progress Notes (Signed)
Subjective:  Christy MedianMaria P Angelina PihValadez Gauntt is a 45 y.o. [phone] at 261w1d being seen today for ongoing prenatal care.  She is currently monitored for the following issues for this high-risk pregnancy and has Supervision of high risk pregnancy, antepartum; Previous cesarean delivery, antepartum condition or complication; Preexisting hypertension complicating pregnancy, antepartum; Abscess of right breast; Anemia in pregnancy; and AMA (advanced maternal age) multigravida 35+ on her problem list. Patient is Spanish-speaking only, Spanish interpreter present for this encounter.  Patient reports no complaints.  Contractions: Not present. Vag. Bleeding: None.  Movement: Present. Denies leaking of fluid.   The following portions of the patient's history were reviewed and updated as appropriate: allergies, current medications, past family history, past medical history, past social history, past surgical history and problem list. Problem list updated.  Objective:   Filed Vitals:   10/08/15 0758  BP: 111/70  Pulse: 90  Weight: 162 lb 8 oz (73.71 kg)    Fetal Status: Fetal Heart Rate (bpm): NST Fundal Height: 34 cm Movement: Present     General:  Alert, oriented and cooperative. Patient is in no acute distress.  Skin: Skin is warm and dry. No rash noted.   Cardiovascular: Normal heart rate noted  Respiratory: Normal respiratory effort, no problems with respiration noted  Abdomen: Soft, gravid, appropriate for gestational age. Pain/Pressure: Absent     Pelvic: Vag. Bleeding: None     Cervical exam deferred        Extremities: Normal range of motion.  Edema: None  Mental Status: Normal mood and affect. Normal behavior. Normal judgment and thought content.   Urinalysis: Urine Protein: Negative Urine Glucose: Negative NST performed today was reviewed and was found to be reactive.   Assessment and Plan:  Pregnancy: [id number] at 4761w1d  1. Preexisting hypertension complicating pregnancy, antepartum, third  trimester 2. AMA (advanced maternal age) multigravida 35+, third trimester Continue recommended antenatal testing and prenatal care.  Serial growth scans already scheduled by MFM. - Fetal nonstress test  3. Supervision of high risk pregnancy, antepartum, third trimester Preterm labor symptoms and general obstetric precautions including but not limited to vaginal bleeding, contractions, leaking of fluid and fetal movement were reviewed in detail with the patient. Please refer to After Visit Summary for other counseling recommendations.  Return in about 4 days (around 10/12/2015) for 2x/wk as scheduled.   Tereso NewcomerUgonna A Kitiara Hintze, MD

## 2015-10-08 NOTE — Progress Notes (Signed)
Interpreter Alvi Alvarez present for encounter.  

## 2015-10-12 ENCOUNTER — Ambulatory Visit (INDEPENDENT_AMBULATORY_CARE_PROVIDER_SITE_OTHER): Payer: Medicaid Other | Admitting: *Deleted

## 2015-10-12 DIAGNOSIS — O10913 Unspecified pre-existing hypertension complicating pregnancy, third trimester: Secondary | ICD-10-CM

## 2015-10-12 DIAGNOSIS — Z36 Encounter for antenatal screening of mother: Secondary | ICD-10-CM | POA: Diagnosis not present

## 2015-10-12 DIAGNOSIS — O09523 Supervision of elderly multigravida, third trimester: Secondary | ICD-10-CM | POA: Diagnosis present

## 2015-10-12 NOTE — Progress Notes (Signed)
Reactive NST 

## 2015-10-15 ENCOUNTER — Encounter: Payer: Self-pay | Admitting: Obstetrics and Gynecology

## 2015-10-15 ENCOUNTER — Ambulatory Visit (INDEPENDENT_AMBULATORY_CARE_PROVIDER_SITE_OTHER): Payer: Medicaid Other | Admitting: Obstetrics and Gynecology

## 2015-10-15 VITALS — BP 115/69 | HR 84 | Wt 164.7 lb

## 2015-10-15 DIAGNOSIS — O34219 Maternal care for unspecified type scar from previous cesarean delivery: Secondary | ICD-10-CM

## 2015-10-15 DIAGNOSIS — O0993 Supervision of high risk pregnancy, unspecified, third trimester: Secondary | ICD-10-CM

## 2015-10-15 DIAGNOSIS — O10913 Unspecified pre-existing hypertension complicating pregnancy, third trimester: Secondary | ICD-10-CM | POA: Diagnosis present

## 2015-10-15 DIAGNOSIS — O09523 Supervision of elderly multigravida, third trimester: Secondary | ICD-10-CM | POA: Diagnosis not present

## 2015-10-15 LAB — POCT URINALYSIS DIP (DEVICE)
BILIRUBIN URINE: NEGATIVE
Glucose, UA: NEGATIVE mg/dL
Hgb urine dipstick: NEGATIVE
Ketones, ur: NEGATIVE mg/dL
NITRITE: NEGATIVE
Protein, ur: NEGATIVE mg/dL
Specific Gravity, Urine: 1.02 (ref 1.005–1.030)
Urobilinogen, UA: 0.2 mg/dL (ref 0.0–1.0)
pH: 6.5 (ref 5.0–8.0)

## 2015-10-15 MED ORDER — PRENATAL MULTIVITAMIN CH: 1.0000 | ORAL_TABLET | Freq: Every day | ORAL | Status: DC

## 2015-10-15 MED ORDER — PRENATAL VITAMINS PLUS 27-1 MG PO TABS: 1.0000 | ORAL_TABLET | Freq: Once | ORAL | Status: DC

## 2015-10-15 NOTE — Progress Notes (Signed)
Subjective:  Christy Hernandez is a 45 y.o. [id number] at 4397w1d being seen today for ongoing prenatal care.  She is currently monitored for the following issues for this high-risk pregnancy and has Supervision of high risk pregnancy, antepartum; Previous cesarean delivery, antepartum condition or complication; Preexisting hypertension complicating pregnancy, antepartum; Abscess of right breast; Anemia in pregnancy; and AMA (advanced maternal age) multigravida 35+ on her problem list.  Patient reports no complaints.  Contractions: Not present. Vag. Bleeding: None.  Movement: Present. Denies leaking of fluid.   The following portions of the patient's history were reviewed and updated as appropriate: allergies, current medications, past family history, past medical history, past social history, past surgical history and problem list. Problem list updated.  Objective:   Filed Vitals:   10/15/15 0802  BP: 115/69  Pulse: 84    Fetal Status: Fetal Heart Rate (bpm): NST   Movement: Present     General:  Alert, oriented and cooperative. Patient is in no acute distress.  Skin: Skin is warm and dry. No rash noted.   Cardiovascular: Normal heart rate noted  Respiratory: Normal respiratory effort, no problems with respiration noted  Abdomen: Soft, gravid, appropriate for gestational age. Pain/Pressure: Absent     Pelvic: Vag. Bleeding: None     Cervical exam deferred        Extremities: Normal range of motion.  Edema: None  Mental Status: Normal mood and affect. Normal behavior. Normal judgment and thought content.   Urinalysis:      Assessment and Plan:  Pregnancy: [id number] at 597w1d  1. Preexisting hypertension complicating pregnancy, antepartum, third trimester BP well controlled without medication Continue ASA daily Follow up growth ultrasound on 4/3 - Fetal nonstress test- reviewed and reactive  2. Previous cesarean delivery, antepartum condition or complication VBAC consent  signed  3. Supervision of high risk pregnancy, antepartum, third trimester Patient is doing well without complaints - Prenatal Vit-Fe Fumarate-FA (PRENATAL MULTIVITAMIN) TABS tablet; Take 1 tablet by mouth daily.  Dispense: 30 tablet; Refill: 6 - Prenatal Vit-Fe Fumarate-FA (PRENATAL VITAMINS PLUS) 27-1 MG TABS; Take 1 tablet by mouth once.  Dispense: 30 tablet; Refill: 2  4. AMA (advanced maternal age) multigravida 35+, third trimester   Preterm labor symptoms and general obstetric precautions including but not limited to vaginal bleeding, contractions, leaking of fluid and fetal movement were reviewed in detail with the patient. Please refer to After Visit Summary for other counseling recommendations.  Return in about 4 days (around 10/19/2015) for as scheduled.   Catalina AntiguaPeggy Gaetana Kawahara, MD

## 2015-10-15 NOTE — Progress Notes (Signed)
Interpreter Pearletha AlfredMaria Elena Jimenez present for encounter.  US for growth on 4/3.

## 2015-10-19 ENCOUNTER — Ambulatory Visit (INDEPENDENT_AMBULATORY_CARE_PROVIDER_SITE_OTHER): Payer: Medicaid Other | Admitting: *Deleted

## 2015-10-19 ENCOUNTER — Other Ambulatory Visit (HOSPITAL_COMMUNITY): Payer: Self-pay | Admitting: Obstetrics and Gynecology

## 2015-10-19 ENCOUNTER — Ambulatory Visit (HOSPITAL_COMMUNITY)
Admission: RE | Admit: 2015-10-19 | Discharge: 2015-10-19 | Disposition: A | Discharge status: Home / Self Care | Payer: Medicaid Other | Source: Ambulatory Visit | Attending: Obstetrics & Gynecology | Admitting: Obstetrics & Gynecology

## 2015-10-19 ENCOUNTER — Encounter (HOSPITAL_COMMUNITY): Payer: Self-pay

## 2015-10-19 VITALS — BP 115/74 | HR 85 | Wt 162.3 lb

## 2015-10-19 DIAGNOSIS — O34219 Maternal care for unspecified type scar from previous cesarean delivery: Secondary | ICD-10-CM | POA: Diagnosis not present

## 2015-10-19 DIAGNOSIS — O10013 Pre-existing essential hypertension complicating pregnancy, third trimester: Secondary | ICD-10-CM | POA: Insufficient documentation

## 2015-10-19 DIAGNOSIS — Z3A34 34 weeks gestation of pregnancy: Secondary | ICD-10-CM | POA: Diagnosis not present

## 2015-10-19 DIAGNOSIS — O162 Unspecified maternal hypertension, second trimester: Secondary | ICD-10-CM

## 2015-10-19 DIAGNOSIS — O10913 Unspecified pre-existing hypertension complicating pregnancy, third trimester: Secondary | ICD-10-CM | POA: Diagnosis not present

## 2015-10-19 DIAGNOSIS — O09523 Supervision of elderly multigravida, third trimester: Secondary | ICD-10-CM

## 2015-10-19 LAB — FETAL NONSTRESS TEST

## 2015-10-19 LAB — POCT URINALYSIS DIP (DEVICE)
BILIRUBIN URINE: NEGATIVE
GLUCOSE, UA: NEGATIVE mg/dL
Hgb urine dipstick: NEGATIVE
KETONES UR: NEGATIVE mg/dL
NITRITE: NEGATIVE
Protein, ur: NEGATIVE mg/dL
Specific Gravity, Urine: 1.025 (ref 1.005–1.030)
Urobilinogen, UA: 0.2 mg/dL (ref 0.0–1.0)
pH: 7 (ref 5.0–8.0)

## 2015-10-19 NOTE — Progress Notes (Signed)
US for growth done today 

## 2015-10-22 ENCOUNTER — Ambulatory Visit (INDEPENDENT_AMBULATORY_CARE_PROVIDER_SITE_OTHER): Payer: Medicaid Other | Admitting: Obstetrics & Gynecology

## 2015-10-22 VITALS — BP 114/66 | HR 84 | Wt 164.3 lb

## 2015-10-22 DIAGNOSIS — O09523 Supervision of elderly multigravida, third trimester: Secondary | ICD-10-CM

## 2015-10-22 DIAGNOSIS — O0993 Supervision of high risk pregnancy, unspecified, third trimester: Secondary | ICD-10-CM

## 2015-10-22 DIAGNOSIS — O10913 Unspecified pre-existing hypertension complicating pregnancy, third trimester: Secondary | ICD-10-CM

## 2015-10-22 LAB — POCT URINALYSIS DIP (DEVICE)
BILIRUBIN URINE: NEGATIVE
Glucose, UA: NEGATIVE mg/dL
HGB URINE DIPSTICK: NEGATIVE
KETONES UR: NEGATIVE mg/dL
Nitrite: NEGATIVE
PH: 7 (ref 5.0–8.0)
Protein, ur: NEGATIVE mg/dL
Specific Gravity, Urine: 1.02 (ref 1.005–1.030)
Urobilinogen, UA: 0.2 mg/dL (ref 0.0–1.0)

## 2015-10-22 NOTE — Progress Notes (Signed)
Interpreter [employer] Mora present for encounter. US for growth done 4/3. Breastfeeding tip of the week reviewed.

## 2015-10-22 NOTE — Progress Notes (Signed)
Subjective:  Christy Hernandez is a 45 y.o. [phone] at 9836w1d being seen today for ongoing prenatal care.  She is currently monitored for the following issues for this high-risk pregnancy and has Supervision of high risk pregnancy, antepartum; Previous cesarean delivery, antepartum condition or complication; Preexisting hypertension complicating pregnancy, antepartum; Abscess of right breast; Anemia in pregnancy; and AMA (advanced maternal age) multigravida 35+ on her problem list.  Patient is Spanish-speaking only, Spanish interpreter present for this encounter.  Patient reports no complaints.  Contractions: Not present. Vag. Bleeding: None.  Movement: Present. Denies leaking of fluid.   The following portions of the patient's history were reviewed and updated as appropriate: allergies, current medications, past family history, past medical history, past social history, past surgical history and problem list. Problem list updated.  Objective:   Filed Vitals:   10/22/15 0745  BP: 114/66  Pulse: 84  Weight: 164 lb 4.8 oz (74.526 kg)    Fetal Status: Fetal Heart Rate (bpm): NST Fundal Height: 35 cm Movement: Present     General:  Alert, oriented and cooperative. Patient is in no acute distress.  Skin: Skin is warm and dry. No rash noted.   Cardiovascular: Normal heart rate noted  Respiratory: Normal respiratory effort, no problems with respiration noted  Abdomen: Soft, gravid, appropriate for gestational age. Pain/Pressure: Absent     Pelvic: Vag. Bleeding: None    Cervical exam deferred        Extremities: Normal range of motion.  Edema: None  Mental Status: Normal mood and affect. Normal behavior. Normal judgment and thought content.   Urinalysis: Urine Protein: Negative Urine Glucose: Negative NST performed today was reviewed and was found to be reactive.   4/3 scan EFW 56% (5-6), AFI 20.7 cm, cephalic Assessment and Plan:  Pregnancy: [id number] at 7636w1d  1. AMA (advanced maternal  age) multigravida 35+, third trimester - Fetal nonstress test  2. Preexisting hypertension complicating pregnancy, antepartum, third trimester Stable BP, no meds.   Continue recommended antenatal testing and prenatal care. - Fetal nonstress test  3. Supervision of high risk pregnancy, antepartum, third trimester Preterm labor symptoms and general obstetric precautions including but not limited to vaginal bleeding, contractions, leaking of fluid and fetal movement were reviewed in detail with the patient. Please refer to After Visit Summary for other counseling recommendations.  Return in about 4 days (around 10/26/2015) for 2x/wk as scheduled.   Tereso NewcomerUgonna A Raphael Fitzpatrick, MD

## 2015-10-22 NOTE — Patient Instructions (Signed)
Regrese a la clinica cuando tenga su cita. Si tiene problemas o preguntas, llama a la clinica o vaya a la sala de emergencia al [facility].    

## 2015-10-24 ENCOUNTER — Encounter: Payer: Self-pay | Admitting: *Deleted

## 2015-10-26 ENCOUNTER — Ambulatory Visit (INDEPENDENT_AMBULATORY_CARE_PROVIDER_SITE_OTHER): Payer: Medicaid Other | Admitting: *Deleted

## 2015-10-26 VITALS — BP 119/77 | HR 82

## 2015-10-26 DIAGNOSIS — O10913 Unspecified pre-existing hypertension complicating pregnancy, third trimester: Secondary | ICD-10-CM | POA: Diagnosis present

## 2015-10-26 DIAGNOSIS — Z36 Encounter for antenatal screening of mother: Secondary | ICD-10-CM

## 2015-10-26 NOTE — Progress Notes (Signed)
NST reactive.  AFI 16

## 2015-10-29 ENCOUNTER — Other Ambulatory Visit (HOSPITAL_COMMUNITY)
Admission: RE | Admit: 2015-10-29 | Discharge: 2015-10-29 | Disposition: A | Discharge status: Home / Self Care | Payer: Medicaid Other | Source: Ambulatory Visit | Attending: Family Medicine | Admitting: Family Medicine

## 2015-10-29 ENCOUNTER — Encounter: Payer: Self-pay | Admitting: Family Medicine

## 2015-10-29 ENCOUNTER — Ambulatory Visit (INDEPENDENT_AMBULATORY_CARE_PROVIDER_SITE_OTHER): Payer: Medicaid Other | Admitting: Family Medicine

## 2015-10-29 VITALS — BP 118/65 | HR 88 | Wt 165.0 lb

## 2015-10-29 DIAGNOSIS — O34219 Maternal care for unspecified type scar from previous cesarean delivery: Secondary | ICD-10-CM

## 2015-10-29 DIAGNOSIS — Z113 Encounter for screening for infections with a predominantly sexual mode of transmission: Secondary | ICD-10-CM | POA: Diagnosis present

## 2015-10-29 DIAGNOSIS — O09523 Supervision of elderly multigravida, third trimester: Secondary | ICD-10-CM | POA: Diagnosis not present

## 2015-10-29 DIAGNOSIS — O10913 Unspecified pre-existing hypertension complicating pregnancy, third trimester: Secondary | ICD-10-CM | POA: Diagnosis present

## 2015-10-29 DIAGNOSIS — O0993 Supervision of high risk pregnancy, unspecified, third trimester: Secondary | ICD-10-CM

## 2015-10-29 LAB — POCT URINALYSIS DIP (DEVICE)
Bilirubin Urine: NEGATIVE
GLUCOSE, UA: NEGATIVE mg/dL
Hgb urine dipstick: NEGATIVE
KETONES UR: NEGATIVE mg/dL
LEUKOCYTES UA: NEGATIVE
Nitrite: NEGATIVE
PROTEIN: NEGATIVE mg/dL
Specific Gravity, Urine: 1.02 (ref 1.005–1.030)
Urobilinogen, UA: 0.2 mg/dL (ref 0.0–1.0)
pH: 7 (ref 5.0–8.0)

## 2015-10-29 LAB — OB RESULTS CONSOLE GBS: GBS: NEGATIVE

## 2015-10-29 NOTE — Progress Notes (Signed)
Interpreter Raquel Mora present for encounter.  

## 2015-10-29 NOTE — Patient Instructions (Signed)
Lactancia materna (Breastfeeding) Decidir amamantar es una de las mejores elecciones que puede hacer por usted y su beb. El cambio hormonal durante el embarazo produce el desarrollo del tejido mamario y aumenta la cantidad y el tamao de los conductos galactforos. Estas hormonas tambin permiten que las protenas, los azcares y las grasas de la sangre produzcan la leche materna en las glndulas productoras de leche. Las hormonas impiden que la leche materna sea liberada antes del nacimiento del beb, adems de impulsar el flujo de leche luego del nacimiento. Una vez que ha comenzado a amamantar, pensar en el beb, as como la succin o el llanto, pueden estimular la liberacin de leche de las glndulas productoras de leche.  LOS BENEFICIOS DE AMAMANTAR Para el beb  La primera leche (calostro) ayuda a mejorar el funcionamiento del sistema digestivo del beb.  La leche tiene anticuerpos que ayudan a prevenir las infecciones en el beb.  El beb tiene una menor incidencia de asma, alergias y del sndrome de muerte sbita del lactante.  Los nutrientes en la leche materna son mejores para el beb que la leche maternizada y estn preparados exclusivamente para cubrir las necesidades del beb.  La leche materna mejora el desarrollo cerebral del beb.  Es menos probable que el beb desarrolle otras enfermedades, como obesidad infantil, asma o diabetes mellitus de tipo 2. Para usted   La lactancia materna favorece el desarrollo de un vnculo muy especial entre la madre y el beb.  Es conveniente. La leche materna siempre est disponible a la temperatura correcta y es econmica.  La lactancia materna ayuda a quemar caloras y a perder el peso ganado durante el embarazo.  Favorece la contraccin del tero al tamao que tena antes del embarazo de manera ms rpida y disminuye el sangrado (loquios) despus del parto.  La lactancia materna contribuye a reducir el riesgo de desarrollar diabetes  mellitus de tipo 2, osteoporosis o cncer de mama o de ovario en el futuro. SIGNOS DE QUE EL BEB EST HAMBRIENTO Primeros signos de hambre  Aumenta su estado de alerta o actividad.  Se estira.  Mueve la cabeza de un lado a otro.  Mueve la cabeza y abre la boca cuando se le toca la mejilla o la comisura de la boca (reflejo de bsqueda).  Aumenta las vocalizaciones, tales como sonidos de succin, se relame los labios, emite arrullos, suspiros, o chirridos.  Mueve la mano hacia la boca.  Se chupa con ganas los dedos o las manos. Signos tardos de hambre  Est agitado.  Llora de manera intermitente. Signos de hambre extrema Los signos de hambre extrema requerirn que lo calme y lo consuele antes de que el beb pueda alimentarse adecuadamente. No espere a que se manifiesten los siguientes signos de hambre extrema para comenzar a amamantar:   Agitacin.  Llanto intenso y fuerte.  Gritos. INFORMACIN BSICA SOBRE LA LACTANCIA MATERNA Iniciacin de la lactancia materna  Encuentre un lugar cmodo para sentarse o acostarse, con un buen respaldo para el cuello y la espalda.  Coloque una almohada o una manta enrollada debajo del beb para acomodarlo a la altura de la mama (si est sentada). Las almohadas para amamantar se han diseado especialmente a fin de servir de apoyo para los brazos y el beb mientras amamanta.  Asegrese de que el abdomen del beb est frente al suyo.   Masajee suavemente la mama. Con las yemas de los dedos, masajee la pared del pecho hacia el pezn en un movimiento circular.   Esto estimula el flujo de [place] Se[place]kleche. Es posible que Engineer, manufacturing systemsdeba continuar este movimiento mientras amamanta si la leche fluye lentamente.  Sostenga la mama con el pulgar por arriba del pezn y los otros 4 dedos por debajo de la mama. Asegrese de que los dedos se encuentren lejos del pezn y de la boca del beb.  Empuje suavemente los labios del beb con el pezn o con el dedo.  Cuando la boca del  beb se abra lo suficiente, acrquelo rpidamente a la mama e introduzca todo el pezn y la zona oscura que lo rodea (areola), tanto como sea posible, dentro de la boca del beb.  Debe haber ms areola visible por arriba del labio superior del beb que por debajo del labio inferior.  La lengua del beb debe estar entre la enca inferior y la Bardwellmama.  Asegrese de que la boca del beb est en la posicin correcta alrededor del pezn (prendida). Los labios del beb deben crear un sello sobre la mama y estar doblados hacia afuera (invertidos).  Es comn que el beb succione durante 2 a 3 minutos para que comience el flujo de [place] materna. Cmo debe prenderse Es muy importante que le ensee al beb cmo prenderse adecuadamente a la mama. Si el beb no se prende adecuadamente, puede causarle dolor en el pezn y reducir la produccin de Paradise Valleyleche materna, y hacer que el beb tenga un escaso aumento de North Tunicapeso. Adems, si el beb no se prende adecuadamente al pezn, puede tragar aire durante la alimentacin. Esto puede causarle molestias al beb. Hacer eructar al beb al Pilar Platecambiar de mama puede ayudarlo a liberar el aire. Sin embargo, ensearle al beb cmo prenderse a la mama adecuadamente es la mejor manera de evitar que se sienta molesto por tragar Oceanographeraire mientras se alimenta. Signos de que el beb se ha prendido adecuadamente al pezn:   Payton Doughtyironea o succiona de modo silencioso, sin causarle dolor.  Se escucha que traga cada 3 o 4 succiones.  Hay movimientos musculares por arriba y por delante de sus odos al Printmakersuccionar. Signos de que el beb no se ha prendido Audiological scientistadecuadamente al pezn:   Hace ruidos de succin o de chasquido mientras se alimenta.  Siente dolor en el pezn. Si cree que el beb no se prendi correctamente, deslice el dedo en la comisura de la boca y Ameren Corporationcolquelo entre las encas del beb para interrumpir la succin. Intente comenzar a amamantar nuevamente. Signos de Fish farm managerlactancia materna exitosa Signos  del beb:   Disminuye gradualmente el nmero de succiones o cesa la succin por completo.  Se duerme.  Relaja el cuerpo.  Retiene una pequea cantidad de Kindred Healthcareleche en la boca.  Se desprende solo del pecho. Signos que presenta usted:  Las mamas han aumentado la firmeza, el peso y el tamao 1 a 3 horas despus de Museum/gallery exhibitions officeramamantar.  Estn ms blandas inmediatamente despus de amamantar.  Un aumento del volumen de [place]che, y tambin un cambio en su consistencia y color se producen hacia el quinto da de Tour managerlactancia materna.  Los pezones no duelen, ni estn agrietados ni sangran. Signos de que su beb recibe la cantidad de leche suficiente  Moja al menos 3 paales en 24 horas. La orina debe ser clara y de color amarillo plido a los [address]  [address] de [place]ida.  Defeca al menos 3 veces en 24 horas a los [address]  [address] de [address]. La materia fecal debe ser blanda y Grand Detouramarillenta.  Defeca al menos 3 veces en 24 horas a los  7 das de vida. La materia fecal debe ser grumosa y Schram Cityamarillenta.  No registra una prdida de peso mayor del 10% del peso al nacer durante los primeros 3 [address]  [address] de [place]ida.  Aumenta de peso un promedio de 4 a 7onzas (113 a 198g) por semana despus de los [address]  [address] de vida.  Aumenta de Hannasvillepeso, Waterbury Centerdiariamente, de Verdunvillemanera uniforme a Glass blower/designerpartir de los [address]  [address] de vida, sin Passenger transport managerregistrar prdida de peso despus de las 2semanas de vida. Despus de alimentarse, es posible que el beb regurgite una pequea cantidad. Esto es frecuente. FRECUENCIA Y DURACIN DE LA LACTANCIA MATERNA El amamantamiento frecuente la ayudar a producir ms Azerbaijanleche y a Education officer, communityprevenir problemas de Engineer, miningdolor en los pezones e hinchazn en las [place]amamas. Alimente al beb cuando muestre signos de hambre o si siente la necesidad de reducir la congestin de las Cabanmamas. Esto se denomina "lactancia a demanda". Evite el uso del chupete mientras trabaja para establecer la lactancia (las primeras 4 a 6 semanas despus del nacimiento del beb). Despus de este perodo, podr ofrecerle un  chupete. Las investigaciones demostraron que el uso del chupete durante el primer ao de vida del beb disminuye el riesgo de desarrollar el sndrome de muerte sbita del lactante (SMSL). Permita que el nio se alimente en cada mama todo lo que desee. Contine amamantando al beb hasta que haya terminado de alimentarse. Cuando el beb se desprende o se queda dormido mientras se est alimentando de la primera mama, ofrzcale la segunda. Debido a que, con frecuencia, los recin Sunoconacidos permanecen somnolientos las primeras semanas de vida, es posible que deba despertar al beb para alimentarlo. Los horarios de Acupuncturistlactancia varan de un beb a otro. Sin embargo, las siguientes reglas pueden servir como gua para ayudarla a Lawyergarantizar que el beb se alimenta adecuadamente:  Se puede amamantar a los recin nacidos (bebs de 4 semanas o menos de vida) cada 1 a 3 horas.  No deben transcurrir ms de 3 horas durante el da o 5 horas durante la noche sin que se amamante a los recin nacidos.  Debe amamantar al beb 8 veces como mnimo en un perodo de 24 horas, hasta que comience a introducir slidos en su dieta, a los 6 meses de vida aproximadamente. EXTRACCIN DE Dean Foods CompanyLECHE MATERNA La extraccin y Contractorel almacenamiento de la leche materna le permiten asegurarse de que el beb se alimente exclusivamente de Glencoeleche materna, aun en momentos en los que no puede amamantar. Esto tiene especial importancia si debe regresar al Aleen Campitrabajo en el perodo en que an est amamantando o si no puede estar presente en los momentos en que el beb debe alimentarse. Su asesor en lactancia puede orientarla sobre cunto tiempo es seguro almacenar Steamboatleche materna.  El sacaleche es un aparato que le permite extraer leche de la mama a un recipiente estril. Luego, la leche materna extrada puede almacenarse en un refrigerador o Electrical engineercongelador. Algunos sacaleches son Birdie Riddlemanuales, Delaney Meigsmientras que otros son elctricos. Consulte a su asesor en lactancia qu tipo ser  ms conveniente para usted. Los sacaleches se pueden comprar; sin embargo, algunos hospitales y grupos de apoyo a la lactancia materna alquilan Sports coachsacaleches mensualmente. Un asesor en lactancia puede ensearle cmo extraer W. R. Berkleyleche materna manualmente, en caso de que prefiera no usar un sacaleche.  CMO CUIDAR LAS MAMAS DURANTE LA LACTANCIA MATERNA Los pezones se secan, agrietan y duelen durante la Tour managerlactancia materna. Las siguientes recomendaciones pueden ayudarla a Pharmacologistmantener las TEPPCO Partnersmamas humectadas y sanas:  Careers information officervite usar jabn en los pezones.  Use un sostn de soporte. Aunque no son esenciales, las camisetas sin mangas o los sostenes especiales para Museum/gallery exhibitions officeramamantar estn diseados para acceder fcilmente a las mamas, para Museum/gallery exhibitions officeramamantar sin tener que quitarse todo el sostn o la camiseta. Evite usar sostenes con aro o sostenes muy ajustados.  Seque al aire sus pezones durante 3 a 4minutos despus de amamantar al beb.  Utilice solo apsitos de Haematologistalgodn en el sostn para Environmental health practitionerabsorber las prdidas de Abseconleche. La prdida de un poco de Public Service Enterprise Groupleche materna entre las tomas es normal.  Utilice lanolina sobre los pezones luego de Museum/gallery exhibitions officeramamantar. La lanolina ayuda a mantener la humedad normal de la piel. Si Botswanausa lanolina pura, no tiene que lavarse los pezones antes de volver a Corporate treasureralimentar al beb. La lanolina pura no es txica para el beb. Adems, puede extraer Beazer Homesmanualmente algunas gotas de Foremanleche materna y Engineer, maintenance (IT)masajear suavemente esa Winn-Dixieleche sobre los pezones, para que la [place] Henryleche se seque al aire. Durante las primeras semanas despus de dar a luz, algunas mujeres pueden experimentar hinchazn en las mamas (congestin Lakes of the Northmamaria). La congestin puede hacer que sienta las mamas pesadas, calientes y sensibles al tacto. El pico de la congestin ocurre dentro de los 3 a 5 das despus del Westonparto. Las siguientes recomendaciones pueden ayudarla a Paramedicaliviar la congestin:  Vace por completo las mamas al QUALCOMMamamantar o Environmental health practitionerextraer leche. Puede aplicar calor hmedo en las mamas  (en la ducha o con toallas hmedas para manos) antes de Museum/gallery exhibitions officeramamantar o extraer WPS Resourcesleche. Esto aumenta la circulacin y Saint Vincent and the Grenadinesayuda a que la Harrisleche fluya. Si el beb no vaca por completo las [address]amas cuando lo [address] Aveamamanta, extraiga la Claremoreleche restante despus de que haya finalizado.  Use un sostn ajustado (para amamantar o comn) o una camiseta sin mangas durante 1 o 2 das para indicar al cuerpo que disminuya ligeramente la produccin de Brushleche.  Aplique compresas de hielo Yahoo! Incsobre las mamas, a menos que le resulte demasiado incmodo.  Asegrese de que el beb est prendido y se encuentre en la posicin correcta mientras lo alimenta. Si la congestin persiste luego de 48 horas o despus de seguir estas recomendaciones, comunquese con su mdico o un Holiday representativeasesor en lactancia. RECOMENDACIONES GENERALES PARA EL CUIDADO DE LA SALUD DURANTE LA LACTANCIA MATERNA  Consuma alimentos saludables. Alterne comidas y colaciones, y coma 3 de cada una por da. Dado que lo que come Danaher Corporationafecta la leche materna, es posible que algunas comidas hagan que su beb se vuelva ms irritable de lo habitual. Evite comer este tipo de alimentos si percibe que afectan de manera negativa al beb.  Beba leche, jugos de fruta y agua para Patent examinersatisfacer su sed (aproximadamente 10 vasos al Futures traderda).  Descanse con frecuencia, reljese y tome sus vitaminas prenatales para evitar la fatiga, el estrs y la anemia.  Contine con los autocontroles de la mama.  Evite Product managermasticar y fumar tabaco. Las sustancias qumicas de los cigarrillos que pasan a la leche materna y la exposicin al humo ambiental del tabaco pueden daar al beb.  No consuma alcohol ni drogas, incluida la marihuana. Algunos medicamentos, que pueden ser perjudiciales para el beb, pueden pasar a travs de la Colgate Palmoliveleche materna. Es importante que consulte a su mdico antes de Medical sales representativetomar cualquier medicamento, incluidos todos los medicamentos recetados y de Mosqueroventa libre, as como los suplementos vitamnicos y  herbales. Puede quedar embarazada durante la lactancia. Si desea controlar la natalidad, consulte a su mdico cules son las opciones ms seguras para el beb. SOLICITE ATENCIN MDICA SI:   Daphane ShepherdUsted  siente que quiere dejar de Museum/gallery exhibitions officer o se siente frustrada con la lactancia.  Siente dolor en las mamas o en los pezones.  Sus pezones estn agrietados o Water quality scientist.  Sus pechos estn irritados, sensibles o calientes.  Tiene un rea hinchada en cualquiera de las mamas.  Siente escalofros o fiebre.  Tiene nuseas o vmitos.  Presenta una secrecin de otro lquido distinto de la leche materna de los pezones.  Sus mamas no se llenan antes de Museum/gallery exhibitions officer al beb para el quinto da despus del [place].  Se siente triste y deprimida.  El beb est demasiado somnoliento como para comer bien.  El beb tiene problemas para dormir.  Moja menos de 3 paales en 24 horas.  Defeca menos de 3 veces en 24 horas.  La piel del beb o la parte blanca de los ojos se vuelven amarillentas.  El beb no ha aumentado de [place] a los [address] de [place]. SOLICITE ATENCIN MDICA DE INMEDIATO SI:   El beb est muy cansado Retail buyer) y no se quiere despertar para comer.  Le sube la fiebre sin causa.   Esta informacin no tiene Theme park manager el consejo del mdico. Asegrese de hacerle al mdico cualquier pregunta que tenga.   Document Released: 07/04/2005 Document Revised: 03/25/2015 Elsevier Interactive Patient Education [employer]

## 2015-10-29 NOTE — Progress Notes (Signed)
Subjective:  Christy Hernandez is a 45 y.o. [phone] at 5951w1d being seen today for ongoing prenatal care.  She is currently monitored for the following issues for this high-risk pregnancy and has Supervision of high risk pregnancy, antepartum; Previous cesarean delivery, antepartum condition or complication; Preexisting hypertension complicating pregnancy, antepartum; Abscess of right breast; Anemia in pregnancy; and AMA (advanced maternal age) multigravida 35+ on her problem list.  Patient reports no complaints.  Contractions: Not present. Vag. Bleeding: None.  Movement: Present. Denies leaking of fluid.   The following portions of the patient's history were reviewed and updated as appropriate: allergies, current medications, past family history, past medical history, past social history, past surgical history and problem list. Problem list updated.  Objective:   Filed Vitals:   10/29/15 0843  BP: 118/65  Pulse: 88  Weight: 165 lb (74.844 kg)    Fetal Status: Fetal Heart Rate (bpm): NST   Movement: Present  Presentation: Vertex  General:  Alert, oriented and cooperative. Patient is in no acute distress.  Skin: Skin is warm and dry. No rash noted.   Cardiovascular: Normal heart rate noted  Respiratory: Normal respiratory effort, no problems with respiration noted  Abdomen: Soft, gravid, appropriate for gestational age. Pain/Pressure: Absent     Pelvic: Vag. Bleeding: None     Cervical exam performed Dilation: 1 Effacement (%): Thick Station: -3  Extremities: Normal range of motion.  Edema: None  Mental Status: Normal mood and affect. Normal behavior. Normal judgment and thought content.   Urinalysis: Urine Protein: Negative Urine Glucose: Negative NST reviewed and reactive.  Assessment and Plan:  Pregnancy: [id number] at 3951w1d  1. AMA (advanced maternal age) multigravida 35+, third trimester  - Fetal nonstress test  2. Preexisting hypertension complicating pregnancy,  antepartum, third trimester Normal growth at 56% on 4/3, with normal fluid Continue ASA and 2x/wk testing - Fetal nonstress test  3. Supervision of high risk pregnancy, antepartum, third trimester Continue prenatal care.  - Culture, beta strep (group b only) - Urine cytology ancillary only  4. Previous cesarean delivery, antepartum condition or complication Desires TOLAC  Term labor symptoms and general obstetric precautions including but not limited to vaginal bleeding, contractions, leaking of fluid and fetal movement were reviewed in detail with the patient. Please refer to After Visit Summary for other counseling recommendations.  Return in about 4 days (around 11/02/2015) for 2x/wk as scheduled.   Reva Boresanya S Leevi Cullars, MD

## 2015-10-30 LAB — URINE CYTOLOGY ANCILLARY ONLY
Chlamydia: NEGATIVE
Neisseria Gonorrhea: NEGATIVE

## 2015-10-31 LAB — CULTURE, BETA STREP (GROUP B ONLY)

## 2015-11-02 ENCOUNTER — Ambulatory Visit (INDEPENDENT_AMBULATORY_CARE_PROVIDER_SITE_OTHER): Payer: Medicaid Other | Admitting: *Deleted

## 2015-11-02 VITALS — BP 113/79 | HR 85

## 2015-11-02 DIAGNOSIS — O10913 Unspecified pre-existing hypertension complicating pregnancy, third trimester: Secondary | ICD-10-CM | POA: Diagnosis not present

## 2015-11-02 DIAGNOSIS — O09523 Supervision of elderly multigravida, third trimester: Secondary | ICD-10-CM

## 2015-11-02 DIAGNOSIS — Z23 Encounter for immunization: Secondary | ICD-10-CM

## 2015-11-02 NOTE — Progress Notes (Signed)
Christy Hernandez present for encounter.

## 2015-11-02 NOTE — Progress Notes (Signed)
NST reactive.  AFI 16 

## 2015-11-04 ENCOUNTER — Encounter: Payer: Self-pay | Admitting: Family Medicine

## 2015-11-05 ENCOUNTER — Ambulatory Visit (INDEPENDENT_AMBULATORY_CARE_PROVIDER_SITE_OTHER): Payer: Medicaid Other | Admitting: Family Medicine

## 2015-11-05 VITALS — BP 118/63 | HR 80 | Wt 166.3 lb

## 2015-11-05 DIAGNOSIS — O10913 Unspecified pre-existing hypertension complicating pregnancy, third trimester: Secondary | ICD-10-CM | POA: Diagnosis not present

## 2015-11-05 DIAGNOSIS — O0993 Supervision of high risk pregnancy, unspecified, third trimester: Secondary | ICD-10-CM

## 2015-11-05 DIAGNOSIS — O34219 Maternal care for unspecified type scar from previous cesarean delivery: Secondary | ICD-10-CM

## 2015-11-05 DIAGNOSIS — O09523 Supervision of elderly multigravida, third trimester: Secondary | ICD-10-CM | POA: Diagnosis not present

## 2015-11-05 LAB — POCT URINALYSIS DIP (DEVICE)
BILIRUBIN URINE: NEGATIVE
GLUCOSE, UA: NEGATIVE mg/dL
Hgb urine dipstick: NEGATIVE
KETONES UR: NEGATIVE mg/dL
Leukocytes, UA: NEGATIVE
Nitrite: NEGATIVE
Protein, ur: NEGATIVE mg/dL
Specific Gravity, Urine: 1.015 (ref 1.005–1.030)
Urobilinogen, UA: 0.2 mg/dL (ref 0.0–1.0)
pH: 7 (ref 5.0–8.0)

## 2015-11-05 NOTE — Patient Instructions (Signed)
Tercer trimestre de embarazo (Third Trimester of Pregnancy) El tercer trimestre comprende desde la semana29 hasta la semana42, es decir, desde el mes7 hasta el mes9. El tercer trimestre es un perodo en el que el feto crece rpidamente. Hacia el final del noveno mes, el feto mide alrededor de 20pulgadas (45cm) de largo y pesa entre 6 y 10 libras (2,700 y 4,500kg).  CAMBIOS EN EL ORGANISMO Su organismo atraviesa por muchos cambios durante el embarazo, y estos varan de una mujer a otra.   Seguir aumentando de peso. Es de esperar que aumente entre 25 y 35libras (11 y 16kg) hacia el final del embarazo.  Podrn aparecer las primeras estras en las caderas, el abdomen y las mamas.  Puede tener necesidad de orinar con ms frecuencia porque el feto baja hacia la pelvis y ejerce presin sobre la vejiga.  Debido al embarazo podr sentir acidez estomacal con frecuencia.  Puede estar estreida, ya que ciertas hormonas enlentecen los movimientos de los msculos que empujan los desechos a travs de los intestinos.  Pueden aparecer hemorroides o abultarse e hincharse las venas (venas varicosas).  Puede sentir dolor plvico debido al aumento de peso y a que las hormonas del embarazo relajan las articulaciones entre los huesos de la pelvis. El dolor de espalda puede ser consecuencia de la sobrecarga de los msculos que soportan la postura.  Tal vez haya cambios en el cabello que pueden incluir su engrosamiento, crecimiento rpido y cambios en la textura. Adems, a algunas mujeres se les cae el cabello durante o despus del embarazo, o tienen el cabello seco o fino. Lo ms probable es que el cabello se le normalice despus del nacimiento del beb.  Las mamas seguirn creciendo y le dolern. A veces, puede haber una secrecin amarilla de las mamas llamada calostro.  El ombligo puede salir hacia afuera.  Puede sentir que le falta el aire debido a que se expande el tero.  Puede notar que el feto  "baja" o lo siente ms bajo, en el abdomen.  Puede tener una prdida de secrecin mucosa con sangre. Esto suele ocurrir en el trmino de unos pocos das a una semana antes de que comience el trabajo de parto.  El cuello del tero se vuelve delgado y blando (se borra) cerca de la fecha de parto. QU DEBE ESPERAR EN LOS EXMENES PRENATALES  Le harn exmenes prenatales cada 2semanas hasta la semana36. A partir de ese momento le harn exmenes semanales. Durante una visita prenatal de rutina:  La pesarn para asegurarse de que usted y el feto estn creciendo normalmente.  Le tomarn la presin arterial.  Le medirn el abdomen para controlar el desarrollo del beb.  Se escucharn los latidos cardacos fetales.  Se evaluarn los resultados de los estudios solicitados en visitas anteriores.  Le revisarn el cuello del tero cuando est prxima la fecha de parto para controlar si este se ha borrado. Alrededor de la semana36, el mdico le revisar el cuello del tero. Al mismo tiempo, realizar un anlisis de las secreciones del tejido vaginal. Este examen es para determinar si hay un tipo de bacteria, estreptococo Grupo B. El mdico le explicar esto con ms detalle. El mdico puede preguntarle lo siguiente:  Cmo le gustara que fuera el parto.  Cmo se siente.  Si siente los movimientos del beb.  Si ha tenido sntomas anormales, como prdida de lquido, sangrado, dolores de cabeza intensos o clicos abdominales.  Si est consumiendo algn producto que contenga tabaco, como cigarrillos, tabaco   de mascar y cigarrillos electrnicos.  Si tiene alguna pregunta. Otros exmenes o estudios de deteccin que pueden realizarse durante el tercer trimestre incluyen lo siguiente:  Anlisis de sangre para controlar los niveles de hierro (anemia).  Controles fetales para determinar su salud, nivel de actividad y crecimiento. Si tiene alguna enfermedad o hay problemas durante el embarazo, le harn  estudios.  Prueba del VIH (virus de inmunodeficiencia humana). Si corre un riesgo alto, pueden realizarle una prueba de deteccin del VIH durante el tercer trimestre del embarazo. FALSO TRABAJO DE PARTO Es posible que sienta contracciones leves e irregulares que finalmente desaparecen. Se llaman contracciones de Braxton Hicks o falso trabajo de parto. Las contracciones pueden durar horas, das o incluso semanas, antes de que el verdadero trabajo de parto se inicie. Si las contracciones ocurren a intervalos regulares, se intensifican o se hacen dolorosas, lo mejor es que la revise el mdico.  SIGNOS DE TRABAJO DE PARTO   Clicos de tipo menstrual.  Contracciones cada 5minutos o menos.  Contracciones que comienzan en la parte superior del tero y se extienden hacia abajo, a la zona inferior del abdomen y la espalda.  Sensacin de mayor presin en la pelvis o dolor de espalda.  Una secrecin de mucosidad acuosa o con sangre que sale de la vagina. Si tiene alguno de estos signos antes de la semana37 del embarazo, llame a su mdico de inmediato. Debe concurrir al hospital para que la controlen inmediatamente. INSTRUCCIONES PARA EL CUIDADO EN EL HOGAR   Evite fumar, consumir hierbas, beber alcohol y tomar frmacos que no le hayan recetado. Estas sustancias qumicas afectan la formacin y el desarrollo del beb.  No consuma ningn producto que contenga tabaco, lo que incluye cigarrillos, tabaco de mascar y cigarrillos electrnicos. Si necesita ayuda para dejar de fumar, consulte al mdico. Puede recibir asesoramiento y otro tipo de recursos para dejar de fumar.  Siga las indicaciones del mdico en relacin con el uso de medicamentos. Durante el embarazo, hay medicamentos que son seguros de tomar y otros que no.  Haga ejercicio solamente como se lo haya indicado el mdico. Sentir clicos uterinos es un buen signo para detener la actividad fsica.  Contine comiendo alimentos sanos con  regularidad.  Use un sostn que le brinde buen soporte si le duelen las mamas.  No se d baos de inmersin en agua caliente, baos turcos ni saunas.  Use el cinturn de seguridad en todo momento mientras conduce.  No coma carne cruda ni queso sin cocinar; evite el contacto con las bandejas sanitarias de los gatos y la tierra que estos animales usan. Estos elementos contienen grmenes que pueden causar defectos congnitos en el beb.  Tome las vitaminas prenatales.  Tome entre 1500 y 2000mg de calcio diariamente comenzando en la semana20 del embarazo hasta el parto.  Si est estreida, pruebe un laxante suave (si el mdico lo autoriza). Consuma ms alimentos ricos en fibra, como vegetales y frutas frescos y cereales integrales. Beba gran cantidad de lquido para mantener la orina de tono claro o color amarillo plido.  Dese baos de asiento con agua tibia para aliviar el dolor o las molestias causadas por las hemorroides. Use una crema para las hemorroides si el mdico la autoriza.  Si tiene venas varicosas, use medias de descanso. Eleve los pies durante 15minutos, 3 o 4veces por da. Limite el consumo de sal en su dieta.  Evite levantar objetos pesados, use zapatos de tacones bajos y mantenga una buena postura.  Descanse   con las piernas elevadas si tiene calambres o dolor de cintura.  Visite a su dentista si no lo ha hecho durante el embarazo. Use un cepillo de dientes blando para higienizarse los dientes y psese el hilo dental con suavidad.  Puede seguir manteniendo relaciones sexuales, a menos que el mdico le indique lo contrario.  No haga viajes largos excepto que sea absolutamente necesario y solo con la autorizacin del mdico.  Tome clases prenatales para entender, practicar y hacer preguntas sobre el trabajo de parto y el parto.  Haga un ensayo de la partida al hospital.  Prepare el bolso que llevar al hospital.  Prepare la habitacin del beb.  Concurra a todas  las visitas prenatales segn las indicaciones de su mdico. SOLICITE ATENCIN MDICA SI:  No est segura de que est en trabajo de parto o de que ha roto la bolsa de las aguas.  Tiene mareos.  Siente clicos leves, presin en la pelvis o dolor persistente en el abdomen.  Tiene nuseas, vmitos o diarrea persistentes.  Observa una secrecin vaginal con mal olor.  Siente dolor al orinar. SOLICITE ATENCIN MDICA DE INMEDIATO SI:   Tiene fiebre.  Tiene una prdida de lquido por la vagina.  Tiene sangrado o pequeas prdidas vaginales.  Siente dolor intenso o clicos en el abdomen.  Sube o baja de peso rpidamente.  Tiene dificultad para respirar y siente dolor de pecho.  Sbitamente se le hinchan mucho el rostro, las manos, los tobillos, los pies o las piernas.  No ha sentido los movimientos del beb durante una hora.  Siente un dolor de cabeza intenso que no se alivia con medicamentos.  Su visin se modifica.   Esta informacin no tiene como fin reemplazar el consejo del mdico. Asegrese de hacerle al mdico cualquier pregunta que tenga.   Document Released: 04/13/2005 Document Revised: 07/25/2014 Elsevier Interactive Patient Education 2016 [employer]  

## 2015-11-05 NOTE — Progress Notes (Signed)
Subjective:  Christy Hernandez is a 45 y.o. [phone] at 6226w1d being seen today for ongoing prenatal care.  She is currently monitored for the following issues for this high-risk pregnancy and has Supervision of high risk pregnancy, antepartum; Previous cesarean delivery, antepartum condition or complication; Preexisting hypertension complicating pregnancy, antepartum; Abscess of right breast; Anemia in pregnancy; and AMA (advanced maternal age) multigravida 35+ on her problem list.  Patient reports no complaints.  Contractions: Irregular. Vag. Bleeding: None.  Movement: Present. Denies leaking of fluid.   The following portions of the patient's history were reviewed and updated as appropriate: allergies, current medications, past family history, past medical history, past social history, past surgical history and problem list. Problem list updated.  Objective:   Filed Vitals:   11/05/15 0800  BP: 118/63  Pulse: 80  Weight: 166 lb 4.8 oz (75.433 kg)    Fetal Status: Fetal Heart Rate (bpm): NST   Movement: Present     General:  Alert, oriented and cooperative. Patient is in no acute distress.  Skin: Skin is warm and dry. No rash noted.   Cardiovascular: Normal heart rate noted  Respiratory: Normal respiratory effort, no problems with respiration noted  Abdomen: Soft, gravid, appropriate for gestational age. Pain/Pressure: Present     Pelvic: Vag. Bleeding: None     Cervical exam deferred        Extremities: Normal range of motion.  Edema: None  Mental Status: Normal mood and affect. Normal behavior. Normal judgment and thought content.   Urinalysis: Urine Protein: Negative Urine Glucose: Negative  Assessment and Plan:  Pregnancy: [id number] at 7026w1d  1. Supervision of high risk pregnancy, antepartum, third trimester FHT normal  2. Preexisting hypertension complicating pregnancy, antepartum, third trimester NST reactive.  No meds.  Induce at 40 weeks. - Fetal nonstress test  3.  AMA (advanced maternal age) multigravida 35+, third trimester  - Fetal nonstress test  4. Previous cesarean delivery, antepartum condition or complication Pt desires VBAC  Term labor symptoms and general obstetric precautions including but not limited to vaginal bleeding, contractions, leaking of fluid and fetal movement were reviewed in detail with the patient. Please refer to After Visit Summary for other counseling recommendations.  Return in about 4 days (around 11/09/2015) for 2x/wk as scheduled.   Levie HeritageJacob J Stinson, DO

## 2015-11-05 NOTE — Progress Notes (Signed)
Used Interpreter [employer] Hernandez.

## 2015-11-09 ENCOUNTER — Ambulatory Visit (INDEPENDENT_AMBULATORY_CARE_PROVIDER_SITE_OTHER): Payer: Medicaid Other | Admitting: *Deleted

## 2015-11-09 VITALS — BP 115/70 | HR 79 | Wt 167.6 lb

## 2015-11-09 DIAGNOSIS — O10913 Unspecified pre-existing hypertension complicating pregnancy, third trimester: Secondary | ICD-10-CM

## 2015-11-09 NOTE — Progress Notes (Signed)
NST reactive.

## 2015-11-09 NOTE — Progress Notes (Signed)
Here for nst. Used interpreter Elna Breslowarol Hernandez.

## 2015-11-12 ENCOUNTER — Ambulatory Visit (INDEPENDENT_AMBULATORY_CARE_PROVIDER_SITE_OTHER): Payer: Medicaid Other | Admitting: Obstetrics & Gynecology

## 2015-11-12 VITALS — BP 117/66 | HR 79 | Wt 167.5 lb

## 2015-11-12 DIAGNOSIS — O0993 Supervision of high risk pregnancy, unspecified, third trimester: Secondary | ICD-10-CM

## 2015-11-12 DIAGNOSIS — Z36 Encounter for antenatal screening of mother: Secondary | ICD-10-CM

## 2015-11-12 DIAGNOSIS — O09523 Supervision of elderly multigravida, third trimester: Secondary | ICD-10-CM

## 2015-11-12 DIAGNOSIS — O10913 Unspecified pre-existing hypertension complicating pregnancy, third trimester: Secondary | ICD-10-CM

## 2015-11-12 LAB — POCT URINALYSIS DIP (DEVICE)
BILIRUBIN URINE: NEGATIVE
GLUCOSE, UA: NEGATIVE mg/dL
KETONES UR: NEGATIVE mg/dL
LEUKOCYTES UA: NEGATIVE
Nitrite: NEGATIVE
Protein, ur: NEGATIVE mg/dL
SPECIFIC GRAVITY, URINE: 1.02 (ref 1.005–1.030)
Urobilinogen, UA: 0.2 mg/dL (ref 0.0–1.0)
pH: 6.5 (ref 5.0–8.0)

## 2015-11-12 NOTE — Patient Instructions (Signed)
Regrese a la clinica cuando tenga su cita. Si tiene problemas o preguntas, llama a la clinica o vaya a la sala de emergencia al [facility].    

## 2015-11-12 NOTE — Progress Notes (Signed)
US for growth scheduled on 5/1.

## 2015-11-12 NOTE — Progress Notes (Signed)
Subjective:  Christy Hernandez is a 45 y.o. [phone] at 5839w1d being seen today for ongoing prenatal care. Patient is Spanish-speaking only, Spanish interpreter present for this encounter.  She is currently monitored for the following issues for this high-risk pregnancy and has Supervision of high risk pregnancy, antepartum; Previous cesarean delivery, antepartum condition or complication; Preexisting hypertension complicating pregnancy, antepartum; Abscess of right breast; Anemia in pregnancy; and AMA (advanced maternal age) multigravida 35+ on her problem list.  Patient reports no complaints.  Contractions: Irregular. Vag. Bleeding: None.  Movement: Present. Denies leaking of fluid.   The following portions of the patient's history were reviewed and updated as appropriate: allergies, current medications, past family history, past medical history, past social history, past surgical history and problem list. Problem list updated.  Objective:   Filed Vitals:   11/12/15 0804  BP: 117/66  Pulse: 79  Weight: 167 lb 8 oz (75.978 kg)    Fetal Status: Fetal Heart Rate (bpm): NST   Movement: Present     General:  Alert, oriented and cooperative. Patient is in no acute distress.  Skin: Skin is warm and dry. No rash noted.   Cardiovascular: Normal heart rate noted  Respiratory: Normal respiratory effort, no problems with respiration noted  Abdomen: Soft, gravid, appropriate for gestational age. Pain/Pressure: Present     Pelvic: Vag. Bleeding: None    Cervical exam deferred        Extremities: Normal range of motion.  Edema: None  Mental Status: Normal mood and affect. Normal behavior. Normal judgment and thought content.   Urinalysis: Urine Protein: Negative Urine Glucose: Negative NST performed today was reviewed and was found to be reactive.  AFI was also normal.  Continue recommended antenatal testing and prenatal care.  Assessment and Plan:  Pregnancy: [id number] at 1939w1d  1. Preexisting  hypertension complicating pregnancy, antepartum, third trimester Stable BP. IOL at 40 weeks. - Amniotic fluid index with NST  2. AMA (advanced maternal age) multigravida 35+, third trimester Reassuring testing.  IOL at 40 weeks. - Amniotic fluid index with NST  3. Supervision of high risk pregnancy, antepartum, third trimester Term labor symptoms and general obstetric precautions including but not limited to vaginal bleeding, contractions, leaking of fluid and fetal movement were reviewed in detail with the patient. Please refer to After Visit Summary for other counseling recommendations.  Return in about 4 days (around 11/16/2015) for as scheduled.   Tereso NewcomerUgonna A Jackalyn Haith, MD

## 2015-11-12 NOTE — Progress Notes (Signed)
IOL scheduled 05/10 @ 730am.

## 2015-11-13 ENCOUNTER — Encounter: Payer: Self-pay | Admitting: *Deleted

## 2015-11-16 ENCOUNTER — Ambulatory Visit (INDEPENDENT_AMBULATORY_CARE_PROVIDER_SITE_OTHER): Payer: Medicaid Other | Admitting: General Practice

## 2015-11-16 ENCOUNTER — Ambulatory Visit (HOSPITAL_COMMUNITY)
Admission: RE | Admit: 2015-11-16 | Discharge: 2015-11-16 | Disposition: A | Discharge status: Home / Self Care | Payer: Medicaid Other | Source: Ambulatory Visit | Attending: Obstetrics and Gynecology | Admitting: Obstetrics and Gynecology

## 2015-11-16 ENCOUNTER — Encounter (HOSPITAL_COMMUNITY): Payer: Self-pay

## 2015-11-16 VITALS — BP 120/74

## 2015-11-16 DIAGNOSIS — O34219 Maternal care for unspecified type scar from previous cesarean delivery: Secondary | ICD-10-CM | POA: Insufficient documentation

## 2015-11-16 DIAGNOSIS — O10913 Unspecified pre-existing hypertension complicating pregnancy, third trimester: Secondary | ICD-10-CM | POA: Diagnosis not present

## 2015-11-16 DIAGNOSIS — O162 Unspecified maternal hypertension, second trimester: Secondary | ICD-10-CM

## 2015-11-16 DIAGNOSIS — O10013 Pre-existing essential hypertension complicating pregnancy, third trimester: Secondary | ICD-10-CM | POA: Insufficient documentation

## 2015-11-16 DIAGNOSIS — O09523 Supervision of elderly multigravida, third trimester: Secondary | ICD-10-CM | POA: Insufficient documentation

## 2015-11-16 DIAGNOSIS — Z3A38 38 weeks gestation of pregnancy: Secondary | ICD-10-CM | POA: Insufficient documentation

## 2015-11-16 NOTE — Progress Notes (Signed)
NST reactive.

## 2015-11-16 NOTE — Progress Notes (Signed)
Christy PerkingMaria Hernandez used for check in; NST only today

## 2015-11-19 ENCOUNTER — Ambulatory Visit (INDEPENDENT_AMBULATORY_CARE_PROVIDER_SITE_OTHER): Payer: Medicaid Other | Admitting: Family Medicine

## 2015-11-19 VITALS — BP 122/70 | HR 81 | Wt 169.4 lb

## 2015-11-19 DIAGNOSIS — O10913 Unspecified pre-existing hypertension complicating pregnancy, third trimester: Secondary | ICD-10-CM

## 2015-11-19 DIAGNOSIS — O34219 Maternal care for unspecified type scar from previous cesarean delivery: Secondary | ICD-10-CM | POA: Diagnosis not present

## 2015-11-19 DIAGNOSIS — O09523 Supervision of elderly multigravida, third trimester: Secondary | ICD-10-CM

## 2015-11-19 DIAGNOSIS — O0993 Supervision of high risk pregnancy, unspecified, third trimester: Secondary | ICD-10-CM

## 2015-11-19 LAB — POCT URINALYSIS DIP (DEVICE)
BILIRUBIN URINE: NEGATIVE
Glucose, UA: NEGATIVE mg/dL
HGB URINE DIPSTICK: NEGATIVE
KETONES UR: NEGATIVE mg/dL
Nitrite: NEGATIVE
PH: 7 (ref 5.0–8.0)
Protein, ur: NEGATIVE mg/dL
SPECIFIC GRAVITY, URINE: 1.02 (ref 1.005–1.030)
Urobilinogen, UA: 1 mg/dL (ref 0.0–1.0)

## 2015-11-19 NOTE — Progress Notes (Signed)
Subjective:  Christy Hernandez is a 45 y.o. [phone] at 4930w1d being seen today for ongoing prenatal care.  She is currently monitored for the following issues for this high-risk pregnancy and has Supervision of high risk pregnancy, antepartum; Previous cesarean delivery, antepartum condition or complication; Preexisting hypertension complicating pregnancy, antepartum; Abscess of right breast; Anemia in pregnancy; and AMA (advanced maternal age) multigravida 35+ on her problem list.  Patient reports no complaints.  Contractions: Irregular. Vag. Bleeding: None.  Movement: Present. Denies leaking of fluid.   The following portions of the patient's history were reviewed and updated as appropriate: allergies, current medications, past family history, past medical history, past social history, past surgical history and problem list. Problem list updated.  Objective:   Filed Vitals:   11/19/15 0859  BP: 122/70  Pulse: 81  Weight: 169 lb 6.4 oz (76.839 kg)    Fetal Status: Fetal Heart Rate (bpm): NST   Movement: Present     General:  Alert, oriented and cooperative. Patient is in no acute distress.  Skin: Skin is warm and dry. No rash noted.   Cardiovascular: Normal heart rate noted  Respiratory: Normal respiratory effort, no problems with respiration noted  Abdomen: Soft, gravid, appropriate for gestational age. Pain/Pressure: Present     Pelvic: Vag. Bleeding: None     Cervical exam deferred        Extremities: Normal range of motion.  Edema: None  Mental Status: Normal mood and affect. Normal behavior. Normal judgment and thought content.   Urinalysis: Urine Protein: Negative Urine Glucose: Negative NST reviewed and reactive. U/s shows vtx, normal fluid 8 lb 4 oz (90%), AC > 97% Assessment and Plan:  Pregnancy: [id number] at 9830w1d  1. Preexisting hypertension complicating pregnancy, antepartum, third trimester On no meds and BP is ok--for IOL at 40 wks--scheduled - Fetal nonstress  test  2. Supervision of high risk pregnancy, antepartum, third trimester Continue prenatal care.  3. Previous cesarean delivery, antepartum condition or complication For TOLAC   Term labor symptoms and general obstetric precautions including but not limited to vaginal bleeding, contractions, leaking of fluid and fetal movement were reviewed in detail with the patient. Please refer to After Visit Summary for other counseling recommendations.  Return in about 4 days (around 11/23/2015) for already has 11/23/15 appt and IOL 11/25/15.   Reva Boresanya S Raihan Kimmel, MD

## 2015-11-19 NOTE — Patient Instructions (Addendum)
Lactancia materna (Breastfeeding) Decidir amamantar es una de las mejores elecciones que puede hacer por usted y su beb. El cambio hormonal durante el embarazo produce el desarrollo del tejido mamario y aumenta la cantidad y el tamao de los conductos galactforos. Estas hormonas tambin permiten que las protenas, los azcares y las grasas de la sangre produzcan la leche materna en las glndulas productoras de leche. Las hormonas impiden que la leche materna sea liberada antes del nacimiento del beb, adems de impulsar el flujo de leche luego del nacimiento. Una vez que ha comenzado a amamantar, pensar en el beb, as como la succin o el llanto, pueden estimular la liberacin de leche de las glndulas productoras de leche.  LOS BENEFICIOS DE AMAMANTAR Para el beb  La primera leche (calostro) ayuda a mejorar el funcionamiento del sistema digestivo del beb.  La leche tiene anticuerpos que ayudan a prevenir las infecciones en el beb.  El beb tiene una menor incidencia de asma, alergias y del sndrome de muerte sbita del lactante.  Los nutrientes en la leche materna son mejores para el beb que la leche maternizada y estn preparados exclusivamente para cubrir las necesidades del beb.  La leche materna mejora el desarrollo cerebral del beb.  Es menos probable que el beb desarrolle otras enfermedades, como obesidad infantil, asma o diabetes mellitus de tipo 2. Para usted   La lactancia materna favorece el desarrollo de un vnculo muy especial entre la madre y el beb.  Es conveniente. La leche materna siempre est disponible a la temperatura correcta y es econmica.  La lactancia materna ayuda a quemar caloras y a perder el peso ganado durante el embarazo.  Favorece la contraccin del tero al tamao que tena antes del embarazo de manera ms rpida y disminuye el sangrado (loquios) despus del parto.  La lactancia materna contribuye a reducir el riesgo de desarrollar diabetes  mellitus de tipo 2, osteoporosis o cncer de mama o de ovario en el futuro. SIGNOS DE QUE EL BEB EST HAMBRIENTO Primeros signos de hambre  Aumenta su estado de alerta o actividad.  Se estira.  Mueve la cabeza de un lado a otro.  Mueve la cabeza y abre la boca cuando se le toca la mejilla o la comisura de la boca (reflejo de bsqueda).  Aumenta las vocalizaciones, tales como sonidos de succin, se relame los labios, emite arrullos, suspiros, o chirridos.  Mueve la mano hacia la boca.  Se chupa con ganas los dedos o las manos. Signos tardos de hambre  Est agitado.  Llora de manera intermitente. Signos de hambre extrema Los signos de hambre extrema requerirn que lo calme y lo consuele antes de que el beb pueda alimentarse adecuadamente. No espere a que se manifiesten los siguientes signos de hambre extrema para comenzar a amamantar:   Agitacin.  Llanto intenso y fuerte.  Gritos. INFORMACIN BSICA SOBRE LA LACTANCIA MATERNA Iniciacin de la lactancia materna  Encuentre un lugar cmodo para sentarse o acostarse, con un buen respaldo para el cuello y la espalda.  Coloque una almohada o una manta enrollada debajo del beb para acomodarlo a la altura de la mama (si est sentada). Las almohadas para amamantar se han diseado especialmente a fin de servir de apoyo para los brazos y el beb mientras amamanta.  Asegrese de que el abdomen del beb est frente al suyo.   Masajee suavemente la mama. Con las yemas de los dedos, masajee la pared del pecho hacia el pezn en un movimiento circular.   Esto estimula el flujo de leche. Es posible que deba continuar este movimiento mientras amamanta si la leche fluye lentamente.  Sostenga la mama con el pulgar por arriba del pezn y los otros 4 dedos por debajo de la mama. Asegrese de que los dedos se encuentren lejos del pezn y de la boca del beb.  Empuje suavemente los labios del beb con el pezn o con el dedo.  Cuando la boca del  beb se abra lo suficiente, acrquelo rpidamente a la mama e introduzca todo el pezn y la zona oscura que lo rodea (areola), tanto como sea posible, dentro de la boca del beb.  Debe haber ms areola visible por arriba del labio superior del beb que por debajo del labio inferior.  La lengua del beb debe estar entre la enca inferior y la mama.  Asegrese de que la boca del beb est en la posicin correcta alrededor del pezn (prendida). Los labios del beb deben crear un sello sobre la mama y estar doblados hacia afuera (invertidos).  Es comn que el beb succione durante 2 a 3 minutos para que comience el flujo de leche materna. Cmo debe prenderse Es muy importante que le ensee al beb cmo prenderse adecuadamente a la mama. Si el beb no se prende adecuadamente, puede causarle dolor en el pezn y reducir la produccin de leche materna, y hacer que el beb tenga un escaso aumento de peso. Adems, si el beb no se prende adecuadamente al pezn, puede tragar aire durante la alimentacin. Esto puede causarle molestias al beb. Hacer eructar al beb al cambiar de mama puede ayudarlo a liberar el aire. Sin embargo, ensearle al beb cmo prenderse a la mama adecuadamente es la mejor manera de evitar que se sienta molesto por tragar aire mientras se alimenta. Signos de que el beb se ha prendido adecuadamente al pezn:   Tironea o succiona de modo silencioso, sin causarle dolor.  Se escucha que traga cada 3 o 4 succiones.  Hay movimientos musculares por arriba y por delante de sus odos al succionar. Signos de que el beb no se ha prendido adecuadamente al pezn:   Hace ruidos de succin o de chasquido mientras se alimenta.  Siente dolor en el pezn. Si cree que el beb no se prendi correctamente, deslice el dedo en la comisura de la boca y colquelo entre las encas del beb para interrumpir la succin. Intente comenzar a amamantar nuevamente. Signos de lactancia materna exitosa Signos  del beb:   Disminuye gradualmente el nmero de succiones o cesa la succin por completo.  Se duerme.  Relaja el cuerpo.  Retiene una pequea cantidad de leche en la boca.  Se desprende solo del pecho. Signos que presenta usted:  Las mamas han aumentado la firmeza, el peso y el tamao 1 a 3 horas despus de amamantar.  Estn ms blandas inmediatamente despus de amamantar.  Un aumento del volumen de leche, y tambin un cambio en su consistencia y color se producen hacia el quinto da de lactancia materna.  Los pezones no duelen, ni estn agrietados ni sangran. Signos de que su beb recibe la cantidad de leche suficiente  Moja al menos 3 paales en 24 horas. La orina debe ser clara y de color amarillo plido a los 5 das de vida.  Defeca al menos 3 veces en 24 horas a los 5 das de vida. La materia fecal debe ser blanda y amarillenta.  Defeca al menos 3 veces en 24 horas a los   7 das de vida. La materia fecal debe ser grumosa y amarillenta.  No registra una prdida de peso mayor del 10% del peso al nacer durante los primeros 3 das de vida.  Aumenta de peso un promedio de 4 a 7onzas (113 a 198g) por semana despus de los 4 das de vida.  Aumenta de peso, diariamente, de manera uniforme a partir de los 5 das de vida, sin registrar prdida de peso despus de las 2semanas de vida. Despus de alimentarse, es posible que el beb regurgite una pequea cantidad. Esto es frecuente. FRECUENCIA Y DURACIN DE LA LACTANCIA MATERNA El amamantamiento frecuente la ayudar a producir ms leche y a prevenir problemas de dolor en los pezones e hinchazn en las mamas. Alimente al beb cuando muestre signos de hambre o si siente la necesidad de reducir la congestin de las mamas. Esto se denomina "lactancia a demanda". Evite el uso del chupete mientras trabaja para establecer la lactancia (las primeras 4 a 6 semanas despus del nacimiento del beb). Despus de este perodo, podr ofrecerle un  chupete. Las investigaciones demostraron que el uso del chupete durante el primer ao de vida del beb disminuye el riesgo de desarrollar el sndrome de muerte sbita del lactante (SMSL). Permita que el nio se alimente en cada mama todo lo que desee. Contine amamantando al beb hasta que haya terminado de alimentarse. Cuando el beb se desprende o se queda dormido mientras se est alimentando de la primera mama, ofrzcale la segunda. Debido a que, con frecuencia, los recin nacidos permanecen somnolientos las primeras semanas de vida, es posible que deba despertar al beb para alimentarlo. Los horarios de lactancia varan de un beb a otro. Sin embargo, las siguientes reglas pueden servir como gua para ayudarla a garantizar que el beb se alimenta adecuadamente:  Se puede amamantar a los recin nacidos (bebs de 4 semanas o menos de vida) cada 1 a 3 horas.  No deben transcurrir ms de 3 horas durante el da o 5 horas durante la noche sin que se amamante a los recin nacidos.  Debe amamantar al beb 8 veces como mnimo en un perodo de 24 horas, hasta que comience a introducir slidos en su dieta, a los 6 meses de vida aproximadamente. EXTRACCIN DE LECHE MATERNA La extraccin y el almacenamiento de la leche materna le permiten asegurarse de que el beb se alimente exclusivamente de leche materna, aun en momentos en los que no puede amamantar. Esto tiene especial importancia si debe regresar al trabajo en el perodo en que an est amamantando o si no puede estar presente en los momentos en que el beb debe alimentarse. Su asesor en lactancia puede orientarla sobre cunto tiempo es seguro almacenar leche materna.  El sacaleche es un aparato que le permite extraer leche de la mama a un recipiente estril. Luego, la leche materna extrada puede almacenarse en un refrigerador o congelador. Algunos sacaleches son manuales, mientras que otros son elctricos. Consulte a su asesor en lactancia qu tipo ser  ms conveniente para usted. Los sacaleches se pueden comprar; sin embargo, algunos hospitales y grupos de apoyo a la lactancia materna alquilan sacaleches mensualmente. Un asesor en lactancia puede ensearle cmo extraer leche materna manualmente, en caso de que prefiera no usar un sacaleche.  CMO CUIDAR LAS MAMAS DURANTE LA LACTANCIA MATERNA Los pezones se secan, agrietan y duelen durante la lactancia materna. Las siguientes recomendaciones pueden ayudarla a mantener las mamas humectadas y sanas:  Evite usar jabn en los pezones.    Use un sostn de soporte. Aunque no son esenciales, las camisetas sin mangas o los sostenes especiales para amamantar estn diseados para acceder fcilmente a las mamas, para amamantar sin tener que quitarse todo el sostn o la camiseta. Evite usar sostenes con aro o sostenes muy ajustados.  Seque al aire sus pezones durante 3 a 4minutos despus de amamantar al beb.  Utilice solo apsitos de algodn en el sostn para absorber las prdidas de leche. La prdida de un poco de leche materna entre las tomas es normal.  Utilice lanolina sobre los pezones luego de amamantar. La lanolina ayuda a mantener la humedad normal de la piel. Si usa lanolina pura, no tiene que lavarse los pezones antes de volver a alimentar al beb. La lanolina pura no es txica para el beb. Adems, puede extraer manualmente algunas gotas de leche materna y masajear suavemente esa leche sobre los pezones, para que la leche se seque al aire. Durante las primeras semanas despus de dar a luz, algunas mujeres pueden experimentar hinchazn en las mamas (congestin mamaria). La congestin puede hacer que sienta las mamas pesadas, calientes y sensibles al tacto. El pico de la congestin ocurre dentro de los 3 a 5 das despus del parto. Las siguientes recomendaciones pueden ayudarla a aliviar la congestin:  Vace por completo las mamas al amamantar o extraer leche. Puede aplicar calor hmedo en las mamas  (en la ducha o con toallas hmedas para manos) antes de amamantar o extraer leche. Esto aumenta la circulacin y ayuda a que la leche fluya. Si el beb no vaca por completo las mamas cuando lo amamanta, extraiga la leche restante despus de que haya finalizado.  Use un sostn ajustado (para amamantar o comn) o una camiseta sin mangas durante 1 o 2 das para indicar al cuerpo que disminuya ligeramente la produccin de leche.  Aplique compresas de hielo sobre las mamas, a menos que le resulte demasiado incmodo.  Asegrese de que el beb est prendido y se encuentre en la posicin correcta mientras lo alimenta. Si la congestin persiste luego de 48 horas o despus de seguir estas recomendaciones, comunquese con su mdico o un asesor en lactancia. RECOMENDACIONES GENERALES PARA EL CUIDADO DE LA SALUD DURANTE LA LACTANCIA MATERNA  Consuma alimentos saludables. Alterne comidas y colaciones, y coma 3 de cada una por da. Dado que lo que come afecta la leche materna, es posible que algunas comidas hagan que su beb se vuelva ms irritable de lo habitual. Evite comer este tipo de alimentos si percibe que afectan de manera negativa al beb.  Beba leche, jugos de fruta y agua para satisfacer su sed (aproximadamente 10 vasos al da).  Descanse con frecuencia, reljese y tome sus vitaminas prenatales para evitar la fatiga, el estrs y la anemia.  Contine con los autocontroles de la mama.  Evite masticar y fumar tabaco. Las sustancias qumicas de los cigarrillos que pasan a la leche materna y la exposicin al humo ambiental del tabaco pueden daar al beb.  No consuma alcohol ni drogas, incluida la marihuana. Algunos medicamentos, que pueden ser perjudiciales para el beb, pueden pasar a travs de la leche materna. Es importante que consulte a su mdico antes de tomar cualquier medicamento, incluidos todos los medicamentos recetados y de venta libre, as como los suplementos vitamnicos y  herbales. Puede quedar embarazada durante la lactancia. Si desea controlar la natalidad, consulte a su mdico cules son las opciones ms seguras para el beb. SOLICITE ATENCIN MDICA SI:   Usted   siente que quiere dejar de amamantar o se siente frustrada con la lactancia.  Siente dolor en las mamas o en los pezones.  Sus pezones estn agrietados o sangran.  Sus pechos estn irritados, sensibles o calientes.  Tiene un rea hinchada en cualquiera de las mamas.  Siente escalofros o fiebre.  Tiene nuseas o vmitos.  Presenta una secrecin de otro lquido distinto de la leche materna de los pezones.  Sus mamas no se llenan antes de amamantar al beb para el quinto da despus del parto.  Se siente triste y deprimida.  El beb est demasiado somnoliento como para comer bien.  El beb tiene problemas para dormir.  Moja menos de 3 paales en 24 horas.  Defeca menos de 3 veces en 24 horas.  La piel del beb o la parte blanca de los ojos se vuelven amarillentas.  El beb no ha aumentado de peso a los 5 das de vida. SOLICITE ATENCIN MDICA DE INMEDIATO SI:   El beb est muy cansado (letargo) y no se quiere despertar para comer.  Le sube la fiebre sin causa.   Esta informacin no tiene como fin reemplazar el consejo del mdico. Asegrese de hacerle al mdico cualquier pregunta que tenga.   Document Released: 07/04/2005 Document Revised: 03/25/2015 Elsevier Interactive Patient Education 2016 Elsevier Inc.  Parto vaginal despus de una cesrea (Vaginal Birth After Cesarean Delivery) Un parto vaginal despus de un parto por cesrea es dar a luz por la vagina despus de haber dado a luz por medio de una intervencin quirrgica. En el pasado, si una mujer tena un beb por cesrea, todos los partos posteriores deban hacerse por cesrea. Esto ya no es as. Puede ser seguro para la mam intentar un parto vaginal luego de una cesrea.  Es importante que converse con su mdico  desde comienzos del embarazo de modo que pueda comprender los riesgos, beneficios y opciones. Le dar tiempo para decidir qu es lo mejor en su caso particular. La decisin final de tener un parto vaginal o por cesrea debe tomarse en conjunto, entre usted y el mdico. Cualquier cambio en su salud o la de su beb durante el embarazo puede ser motivo de un cambio de decisin respecto del parto vaginal.  LAS MUJERES QUE OPTAN POR EL PARTO VAGINAL, DEBEN CONSULTAR AL MDICO PARA ASEGURARSE DE QUE:  La cesrea anterior se haya realizado con un corte (incisin) uterino transversal (no con una incisin vertical clsica).  El canal de parto es lo suficientemente grande como para que pase el nio.  No ha sido sometida a otras operaciones del tero.  Durante el trabajo de parto, le realizarn un monitoreo fetal electrnico, en todo momento.  Habr un quirfano disponible y listo en caso de necesitar una cesrea de emergencia.  Un mdico y personal de quirfano estarn disponibles en todo momento durante el trabajo de parto, para realizar una cesrea en caso de ser necesario.  Habr un anestesista disponible en caso de necesitar una cesrea de emergencia.  La nursery est lista y cuenta con personal especializado y el equipo disponible para cuidar al beb en caso de emergencia. BENEFICIOS DEL PARTO VAGINAL:  Permanencia ms breve en el hospital.  Prevencin de los riesgos asociados con el parto por cesrea, por ejemplo:  Complicaciones quirrgicas, como apertura o hernia de la incisin.  Lesiones en otros rganos.  Fiebre. Esto puede ocurrir si aparece una infeccin despus de la ciruga. Tambin puede ocurrir como reaccin a los medicamentos administrados para adormecerla   durante la ciruga.  Menos prdida de sangre y menos probabilidad de necesitar una transfusin sangunea.  Menor riesgo de cogulos sanguneos e infeccin.  Tiempo ms corto de recuperacin.  Menor riesgo de remocin del  tero (histerectoma).  Menor riesgo de que la placenta cubra parcial o completamente la abertura del tero (placenta previa) en embarazos futuros.  Menos riesgos en el trabajo de parto y el parto futuros. RIESGOS  Ruptura del tero. Esto ocurre en menos del 1% de los partos vaginales. El riesgo de que eso suceda es mayor si:  Se toman medidas para iniciar el proceso del trabajo de parto (inducir el parto) o estimular o intensificar las contracciones (aumentar el trabajo de parto).  Se usan medicamentos para ablandar (madurar) el cuello del tero.  Es necesario extraer el tero (histerectoma) si se rompe. No debe llevarse a cabo si:  La cesrea previa se realiz con una incisin vertical (clsica) o con forma de T, o usted no sabe cul de ellas le han practicado.  Ha sufrido ruptura del tero.  Ha tenido ciertos tipos de ciruga en el tero, como la extirpacin de fibromas uterinos. Pregntele a su mdico sobre otros tipos de cirugas que le impiden tener un parto vaginal.  Tiene ciertos problemas mdicos o relacionados con el parto (obsttricos).  El beb est en problemas.  Tuvo dos cesreas previas y ningn parto vaginal. OTRAS COSAS QUE DEBE SABER:  La anestesia peridural es segura.  Es seguro dar vuelta al beb si se encuentra de nalgas (intentar una versin ceflica externa).  Es seguro intentarlo en caso de mellizos.  El parto vaginal puede no ser apropiado si el beb pesa 8,8lb (4kg) o ms. Sin embargo, las predicciones de peso no son siempre exactas y no deben ser lo nico a tenerse en cuenta para decidir si el parto vaginal es lo indicado para usted.  Hay aumento en el porcentaje de fracasos si el intervalo entre la cesrea y el parto vaginal es de menos de 19 meses.  Su mdico puede aconsejarle no tener un parto vaginal si tiene preeclampsia (hipertensin, protena en la orina e hinchazn en la cara y las extremidades).  El parto vaginal suele ser exitoso si ya  tuvo un parto vaginal previamente.  Tambin suele ser exitoso cuando el trabajo de parto comienza espontneamente antes de la fecha.  El parto vaginal despus de una cesrea es similar a un parto espontneo vaginal normal.   Esta informacin no tiene como fin reemplazar el consejo del mdico. Asegrese de hacerle al mdico cualquier pregunta que tenga.   Document Released: 12/21/2007 Document Revised: 04/24/2013 Elsevier Interactive Patient Education 2016 Elsevier Inc.  

## 2015-11-19 NOTE — Progress Notes (Signed)
Used Interpreter [employer]iel Gallego.

## 2015-11-23 ENCOUNTER — Encounter (HOSPITAL_COMMUNITY): Payer: Self-pay | Admitting: *Deleted

## 2015-11-23 ENCOUNTER — Telehealth (HOSPITAL_COMMUNITY): Payer: Self-pay | Admitting: *Deleted

## 2015-11-23 ENCOUNTER — Ambulatory Visit (INDEPENDENT_AMBULATORY_CARE_PROVIDER_SITE_OTHER): Payer: Medicaid Other | Admitting: *Deleted

## 2015-11-23 VITALS — BP 131/75 | HR 85

## 2015-11-23 DIAGNOSIS — O10913 Unspecified pre-existing hypertension complicating pregnancy, third trimester: Secondary | ICD-10-CM | POA: Diagnosis not present

## 2015-11-23 DIAGNOSIS — O09523 Supervision of elderly multigravida, third trimester: Secondary | ICD-10-CM

## 2015-11-23 DIAGNOSIS — Z36 Encounter for antenatal screening of mother: Secondary | ICD-10-CM | POA: Diagnosis not present

## 2015-11-23 LAB — POCT URINALYSIS DIP (DEVICE)
BILIRUBIN URINE: NEGATIVE
Glucose, UA: NEGATIVE mg/dL
HGB URINE DIPSTICK: NEGATIVE
KETONES UR: NEGATIVE mg/dL
Nitrite: NEGATIVE
PH: 7 (ref 5.0–8.0)
Protein, ur: NEGATIVE mg/dL
SPECIFIC GRAVITY, URINE: 1.015 (ref 1.005–1.030)
Urobilinogen, UA: 0.2 mg/dL (ref 0.0–1.0)

## 2015-11-23 NOTE — Telephone Encounter (Signed)
Preadmission screen  

## 2015-11-23 NOTE — Addendum Note (Signed)
Addended by: Jill SideAY, Thang Flett L on: 11/23/2015 01:59 PM   Modules accepted: Orders

## 2015-11-23 NOTE — Progress Notes (Signed)
IOL scheduled 5/10

## 2015-11-23 NOTE — Progress Notes (Signed)
NST reactive.

## 2015-11-23 NOTE — Telephone Encounter (Signed)
Interpreter number [phone]

## 2015-11-25 ENCOUNTER — Inpatient Hospital Stay (HOSPITAL_COMMUNITY)
Admission: RE | Admit: 2015-11-25 | Discharge: 2015-11-27 | DRG: 767 | Disposition: A | Discharge status: Home / Self Care | Payer: Medicaid Other | Source: Ambulatory Visit | Attending: Obstetrics and Gynecology | Admitting: Obstetrics and Gynecology

## 2015-11-25 ENCOUNTER — Encounter (HOSPITAL_COMMUNITY): Payer: Self-pay

## 2015-11-25 ENCOUNTER — Other Ambulatory Visit: Payer: Self-pay | Admitting: Advanced Practice Midwife

## 2015-11-25 VITALS — BP 107/58 | HR 71 | Temp 97.9°F | Resp 18 | Ht 62.0 in | Wt 169.0 lb

## 2015-11-25 DIAGNOSIS — Z3A40 40 weeks gestation of pregnancy: Secondary | ICD-10-CM | POA: Diagnosis not present

## 2015-11-25 DIAGNOSIS — Z8249 Family history of ischemic heart disease and other diseases of the circulatory system: Secondary | ICD-10-CM

## 2015-11-25 DIAGNOSIS — O1092 Unspecified pre-existing hypertension complicating childbirth: Secondary | ICD-10-CM | POA: Diagnosis not present

## 2015-11-25 DIAGNOSIS — Z7982 Long term (current) use of aspirin: Secondary | ICD-10-CM | POA: Diagnosis not present

## 2015-11-25 DIAGNOSIS — D509 Iron deficiency anemia, unspecified: Secondary | ICD-10-CM | POA: Diagnosis not present

## 2015-11-25 DIAGNOSIS — Z302 Encounter for sterilization: Secondary | ICD-10-CM | POA: Diagnosis present

## 2015-11-25 DIAGNOSIS — O10913 Unspecified pre-existing hypertension complicating pregnancy, third trimester: Secondary | ICD-10-CM

## 2015-11-25 DIAGNOSIS — O10919 Unspecified pre-existing hypertension complicating pregnancy, unspecified trimester: Secondary | ICD-10-CM | POA: Diagnosis present

## 2015-11-25 DIAGNOSIS — O3663X0 Maternal care for excessive fetal growth, third trimester, not applicable or unspecified: Secondary | ICD-10-CM | POA: Diagnosis present

## 2015-11-25 DIAGNOSIS — O1002 Pre-existing essential hypertension complicating childbirth: Secondary | ICD-10-CM | POA: Diagnosis present

## 2015-11-25 DIAGNOSIS — O99013 Anemia complicating pregnancy, third trimester: Secondary | ICD-10-CM

## 2015-11-25 DIAGNOSIS — O34211 Maternal care for low transverse scar from previous cesarean delivery: Secondary | ICD-10-CM | POA: Diagnosis present

## 2015-11-25 DIAGNOSIS — O9902 Anemia complicating childbirth: Secondary | ICD-10-CM | POA: Diagnosis present

## 2015-11-25 DIAGNOSIS — O09529 Supervision of elderly multigravida, unspecified trimester: Secondary | ICD-10-CM

## 2015-11-25 DIAGNOSIS — O0990 Supervision of high risk pregnancy, unspecified, unspecified trimester: Secondary | ICD-10-CM

## 2015-11-25 DIAGNOSIS — O99019 Anemia complicating pregnancy, unspecified trimester: Secondary | ICD-10-CM | POA: Diagnosis present

## 2015-11-25 DIAGNOSIS — O34219 Maternal care for unspecified type scar from previous cesarean delivery: Secondary | ICD-10-CM | POA: Diagnosis present

## 2015-11-25 DIAGNOSIS — O09523 Supervision of elderly multigravida, third trimester: Secondary | ICD-10-CM

## 2015-11-25 DIAGNOSIS — O0993 Supervision of high risk pregnancy, unspecified, third trimester: Secondary | ICD-10-CM

## 2015-11-25 DIAGNOSIS — D649 Anemia, unspecified: Secondary | ICD-10-CM | POA: Diagnosis present

## 2015-11-25 LAB — CBC
HCT: 22.1 % — ABNORMAL LOW (ref 36.0–46.0)
Hemoglobin: 7.6 g/dL — ABNORMAL LOW (ref 12.0–15.0)
MCH: 29.3 pg (ref 26.0–34.0)
MCHC: 34.4 g/dL (ref 30.0–36.0)
MCV: 85.3 fL (ref 78.0–100.0)
PLATELETS: 163 10*3/uL (ref 150–400)
RBC: 2.59 MIL/uL — AB (ref 3.87–5.11)
RDW: 13.8 % (ref 11.5–15.5)
WBC: 17.4 10*3/uL — ABNORMAL HIGH (ref 4.0–10.5)

## 2015-11-25 LAB — RPR: RPR: NONREACTIVE

## 2015-11-25 LAB — PREPARE RBC (CROSSMATCH)

## 2015-11-25 MED ORDER — OXYCODONE-ACETAMINOPHEN 5-325 MG PO TABS: 2.0000 | ORAL_TABLET | ORAL | Status: DC | PRN

## 2015-11-25 MED ORDER — OXYTOCIN BOLUS FROM INFUSION
500.0000 mL | INTRAVENOUS | Status: DC
  Administered 2015-11-26: 500 mL via INTRAVENOUS

## 2015-11-25 MED ORDER — ACETAMINOPHEN 325 MG PO TABS: 650.0000 mg | ORAL_TABLET | ORAL | Status: DC | PRN

## 2015-11-25 MED ORDER — TERBUTALINE SULFATE 1 MG/ML IJ SOLN
0.2500 mg | Freq: Once | INTRAMUSCULAR | Status: DC | PRN
  Filled 2015-11-25: qty 1

## 2015-11-25 MED ORDER — LACTATED RINGERS IV SOLN
INTRAVENOUS | Status: DC
  Administered 2015-11-25 – 2015-11-26 (×5): via INTRAVENOUS

## 2015-11-25 MED ORDER — OXYCODONE-ACETAMINOPHEN 5-325 MG PO TABS: 1.0000 | ORAL_TABLET | ORAL | Status: DC | PRN

## 2015-11-25 MED ORDER — FENTANYL CITRATE (PF) 100 MCG/2ML IJ SOLN
100.0000 ug | INTRAMUSCULAR | Status: DC | PRN
  Administered 2015-11-25 – 2015-11-26 (×2): 100 ug via INTRAVENOUS
  Filled 2015-11-25 (×2): qty 2

## 2015-11-25 MED ORDER — ONDANSETRON HCL 4 MG/2ML IJ SOLN: 4.0000 mg | Freq: Four times a day (QID) | INTRAMUSCULAR | Status: DC | PRN

## 2015-11-25 MED ORDER — OXYTOCIN 10 UNIT/ML IJ SOLN
1.0000 m[IU]/min | INTRAVENOUS | Status: DC
  Administered 2015-11-25: 2 m[IU]/min via INTRAVENOUS

## 2015-11-25 MED ORDER — CITRIC ACID-SODIUM CITRATE 334-500 MG/5ML PO SOLN: 30.0000 mL | ORAL | Status: DC | PRN

## 2015-11-25 MED ORDER — OXYTOCIN 10 UNIT/ML IJ SOLN
2.5000 [IU]/h | INTRAVENOUS | Status: DC
  Filled 2015-11-25: qty 4

## 2015-11-25 MED ORDER — SODIUM CHLORIDE 0.9 % IV SOLN: Freq: Once | INTRAVENOUS | Status: DC

## 2015-11-25 MED ORDER — LIDOCAINE HCL (PF) 1 % IJ SOLN
30.0000 mL | INTRAMUSCULAR | Status: DC | PRN
  Administered 2015-11-26: 30 mL via SUBCUTANEOUS
  Filled 2015-11-25: qty 30

## 2015-11-25 MED ORDER — LACTATED RINGERS IV SOLN: 500.0000 mL | INTRAVENOUS | Status: DC | PRN

## 2015-11-25 NOTE — Progress Notes (Signed)
Admission assessment, initial assessment, vaginal exam, and plan of care discussed with pt with Spanish interpreter at bedside. All questions answered. Pt states understanding of call bell and when to ask for pain medication.

## 2015-11-25 NOTE — Anesthesia Pain Management Evaluation Note (Signed)
  [facility] Pain Management Visit Note  Patient: Christy Hernandez, 45 y.o., female  "Hello I am a member of the anesthesia team at [facility]. We have an anesthesia team available at all times to provide care throughout the hospital, including epidural management and anesthesia for C-section. I don't know your plan for the delivery whether it a natural birth, water birth, IV sedation, nitrous supplementation, doula or epidural, but we want to meet your pain goals."   1.Was your pain managed to your expectations on prior hospitalizations?   Yes   2.What is your expectation for pain management during this hospitalization?     IV pain meds  3.How can we help you reach that goal? No epidural planned.  Record the patient's initial score and the patient's pain goal.   Pain: 0  Pain Goal: 8 [facility] wants you to be able to say your pain was always managed very well.  Briany Aye 11/25/2015

## 2015-11-25 NOTE — Progress Notes (Signed)
LABOR PROGRESS NOTE  Christy Hernandez is a 45 y.o. [id number] at 1315w0d  admitted for IOL (TOLAC) for cHTN.  Subjective: Pt doing well, pain is well-controlled.  Objective: BP 112/71 mmHg  Pulse 95  Temp(Src) 99.3 F (37.4 C) (Oral)  Resp 20  Ht 5\' 2"  (1.575 m)  Wt 169 lb (76.658 kg)  BMI 30.90 kg/m2  LMP 03/01/2015 (Exact Date) or  Filed Vitals:   11/25/15 1316 11/25/15 1414 11/25/15 1505 11/25/15 1617  BP: 113/59 118/62 131/58 112/71  Pulse: 86 91 92 95  Temp:    99.3 F (37.4 C)  TempSrc:    Oral  Resp: 20 18 20 20   Height:      Weight:        Dilation: 3 Effacement (%): 70 Cervical Position: Middle Station: -2 Presentation: Vertex Exam by:: Dr. Nancy MarusMayo  Labs: Lab Results  Component Value Date   WBC 17.4* 11/25/2015   HGB 7.6* 11/25/2015   HCT 22.1* 11/25/2015   MCV 85.3 11/25/2015   PLT 163 11/25/2015    Patient Active Problem List   Diagnosis Date Noted  . Chronic hypertension in pregnancy 11/25/2015  . AMA (advanced maternal age) multigravida 35+ 07/30/2015  . Abscess of right breast 06/19/2015  . Anemia in pregnancy 06/19/2015  . Supervision of high risk pregnancy, antepartum 06/18/2015  . Previous cesarean delivery, antepartum condition or complication 06/18/2015  . Preexisting hypertension complicating pregnancy, antepartum 06/18/2015    Assessment / Plan: 45 y.o. [id number] at 915w0d here for IOL (TOLAC) for cHTN.  Labor: Foley out at 1630. Cervix currently 3/70/-2. Bishop score 8. Pitocin started. Will consider AROM at next check. Fetal Wellbeing:  Category I Pain Control:  Well-controlled. Does not want epidural Anticipated MOD:  SVD  Hilton SinclairKaty D Krayton Wortley, MD 11/25/2015, 4:33 PM

## 2015-11-25 NOTE — Progress Notes (Signed)
LABOR PROGRESS NOTE  Atilano MedianMaria P Angelina PihValadez Amos is a 45 y.o. [id number] at 7020w0d  admitted for IOL for cHTN.  She is a TOLAC, and had successful VBAC with her last pregnancy.  Subjective: Patient is doing well and reports no pain at this time.  She has not felt the Foley bulb fall out.  Objective: BP 118/62 mmHg  Pulse 91  Temp(Src) 99.1 F (37.3 C) (Oral)  Resp 18  Ht 5\' 2"  (1.575 m)  Wt 76.658 kg (169 lb)  BMI 30.90 kg/m2  LMP 03/01/2015 (Exact Date) or  Filed Vitals:   11/25/15 1132 11/25/15 1214 11/25/15 1316 11/25/15 1414  BP: 129/58 105/54 113/59 118/62  Pulse: 91 80 86 91  Temp: 99.1 F (37.3 C)     TempSrc: Oral     Resp: 20 18 20 18   Height:      Weight:         Dilation: Fingertip Effacement (%): 50 Cervical Position: Posterior Station: -3 Presentation: Undeterminable Exam by:: Henderson NewcomerStephanie Faulk, RN  Labs: Lab Results  Component Value Date   WBC 17.4* 11/25/2015   HGB 7.6* 11/25/2015   HCT 22.1* 11/25/2015   MCV 85.3 11/25/2015   PLT 163 11/25/2015    Patient Active Problem List   Diagnosis Date Noted  . Chronic hypertension in pregnancy 11/25/2015  . AMA (advanced maternal age) multigravida 35+ 07/30/2015  . Abscess of right breast 06/19/2015  . Anemia in pregnancy 06/19/2015  . Supervision of high risk pregnancy, antepartum 06/18/2015  . Previous cesarean delivery, antepartum condition or complication 06/18/2015  . Preexisting hypertension complicating pregnancy, antepartum 06/18/2015    Assessment / Plan: 45 y.o. [id number] at 3620w0d here for IOL for cHTN; TOLAC with successful VBAC in last pregnancy.  Labor: Fingertip/50/-3; Foley bulb placed at 1000 and still in place Fetal Wellbeing:  Category I Pain Control:  Well-controlled without medication at this time; plans to reassess options as labor progresses Anticipated MOD:  SVD  Jonni SangerSara Cristie Mckinney, MS3 11/25/2015, 3:00 PM

## 2015-11-25 NOTE — H&P (Signed)
LABOR AND DELIVERY ADMISSION HISTORY AND PHYSICAL NOTE  Christy Hernandez is a 45 y.o. female [phone] with IUP at 3358w0d by 18 wk US presenting for IOL for cHTN. She is a TOLAC. She had a successful VBAC with her last pregnancy.  She reports positive fetal movement. She denies leakage of fluid or vaginal bleeding.  Prenatal History/Complications:  - CHTN. Was on Lisinopril early in pregnancy, but was stopped at initial prenatal visit (17w). No BPs meds during pregnancy. On ASA, stopped at 37 weeks. - Maternal anemia on ferrous sulfate. Hgb 7.6 on admission. - AMA  Past Medical History: Past Medical History  Diagnosis Date  . No pertinent past medical history   . Hypertension   . Anemia     Past Surgical History: Past Surgical History  Procedure Laterality Date  . Cesarean section      Obstetrical History: OB History    Gravida Para Term Preterm AB TAB SAB Ectopic Multiple Living   6 4 4  0 1 0 1 0 0 4      Social History: Social History   Social History  . Marital Status: Married    Spouse Name: N/A  . Number of Children: N/A  . Years of Education: N/A   Social History Main Topics  . Smoking status: Never Smoker   . Smokeless tobacco: Never Used  . Alcohol Use: No  . Drug Use: No  . Sexual Activity: Yes   Other Topics Concern  . None   Social History Narrative    Family History: Family History  Problem Relation Age of Onset  . Hypertension Mother   . Hypertension Father   . Hypertension Sister     Allergies: No Known Allergies  Prescriptions prior to admission  Medication Sig Dispense Refill Last Dose  . aspirin (ASPIRIN EC) 81 MG EC tablet Take 81 mg by mouth daily. Reported on 11/19/2015   Past Month at Unknown time  . ferrous sulfate 325 (65 FE) MG tablet Take 325 mg by mouth daily with breakfast.   11/25/2015 at Unknown time  . Prenatal Vit-Fe Fumarate-FA (PRENATAL VITAMINS PLUS) 27-1 MG TABS Take 1 tablet by mouth once. (Patient taking  differently: Take 1 tablet by mouth daily. ) 30 tablet 2 11/24/2015 at Unknown time     Review of Systems   All systems reviewed and negative except as stated in HPI  Blood pressure 117/74, pulse 93, temperature 98.6 F (37 C), temperature source Oral, resp. rate 18, height 5\' 2"  (1.575 m), weight 169 lb (76.658 kg), last menstrual period 03/01/2015. General appearance: alert, cooperative and no distress Lungs: clear to auscultation bilaterally Heart: regular rate and rhythm Abdomen: soft, non-tender; bowel sounds normal Extremities: No calf swelling or tenderness Presentation: cephalic by US Fetal monitoring: 125bpm, moderate variability, accelerations present, no decelerations Uterine activity: Mild contractions every 5-10 minutes Dilation: Fingertip Effacement (%): 50 Station: -3 Exam by:: Christy NewcomerStephanie Faulk, RN   Prenatal labs: ABO, Rh: --/--/O POS (05/10 0750) Antibody: NEGATIVE Rubella: Immune RPR: NON REAC (02/09 0851)  HBsAg: Negative (11/14 0000)  HIV: NONREACTIVE (02/09 0851)  GBS: Negative (04/13 0000)  1 hr Glucola: Passed Genetic screening:  Declined Anatomy US: Normal female, anterior placenta  Prenatal Transfer Tool  Maternal Diabetes: No Genetic Screening: Declined Maternal Ultrasounds/Referrals: Normal Fetal Ultrasounds or other Referrals:  None Maternal Substance Abuse:  No Significant Maternal Medications:  None Significant Maternal Lab Results: Lab values include: Group B Strep negative  Results for orders placed or  performed during the hospital encounter of 11/25/15 (from the past 24 hour(s))  CBC   Collection Time: 11/25/15  7:50 AM  Result Value Ref Range   WBC 17.4 (H) 4.0 - 10.5 K/uL   RBC 2.59 (L) 3.87 - 5.11 MIL/uL   Hemoglobin 7.6 (L) 12.0 - 15.0 g/dL   HCT 16.1 (L) 09.6 - 04.5 %   MCV 85.3 78.0 - 100.0 fL   MCH 29.3 26.0 - 34.0 pg   MCHC 34.4 30.0 - 36.0 g/dL   RDW 40.9 81.1 - 91.4 %   Platelets 163 150 - 400 K/uL  Type and screen    Collection Time: 11/25/15  7:50 AM  Result Value Ref Range   ABO/RH(D) O POS    Antibody Screen PENDING    Sample Expiration 11/28/2015     Patient Active Problem List   Diagnosis Date Noted  . Chronic hypertension in pregnancy 11/25/2015  . AMA (advanced maternal age) multigravida 35+ 07/30/2015  . Abscess of right breast 06/19/2015  . Anemia in pregnancy 06/19/2015  . Supervision of high risk pregnancy, antepartum 06/18/2015  . Previous cesarean delivery, antepartum condition or complication 06/18/2015  . Preexisting hypertension complicating pregnancy, antepartum 06/18/2015    Assessment: Christy Hernandez is a 45 y.o. [id number] at [redacted]w[redacted]d here for IOL for cHTN (TOLAC).  #Labor: IOL. Currently fingertip. Foley placed 1000. Cannot use Cytotec because she is a TOLAC. #Pain: IV pain meds. Does not want epidural #FWB: Category I #ID: [id number] #MOF: Both #MOC: BTL- paper signed 3/2 #Circ:  No circ #CHTN: re-start lisinopril PP. bp currently wnl #Anemia: severe. h 7.6. Will place 2 units prbcs on hold #LGA: efw 3729 g @ 38+5. Noted.  Christy Hernandez 11/25/2015, 9:23 AM  OB FELLOW HISTORY AND PHYSICAL ATTESTATION  I have seen and examined this patient; I agree with above documentation in the resident's note.    Christy Hernandez 11/25/2015, 2:52 PM

## 2015-11-25 NOTE — Progress Notes (Signed)
LABOR PROGRESS NOTE  Christy MedianMaria P Angelina PihValadez Ringley is a 45 y.o. [id number] at 8129w0d  admitted for IOL (TOLAC) for cHTN.  Subjective: Patient is doing well and pain is well-controlled.  She reports feeling stronger contractions with the pitocin.  Objective: BP 135/73 mmHg  Pulse 99  Temp(Src) 99.3 F (37.4 C) (Oral)  Resp 18  Ht 5\' 2"  (1.575 m)  Wt 76.658 kg (169 lb)  BMI 30.90 kg/m2  LMP 03/01/2015 (Exact Date) or  Filed Vitals:   11/25/15 1730 11/25/15 1800 11/25/15 1831 11/25/15 1900  BP: 110/76 127/75 128/79 135/73  Pulse: 89 83 85 99  Temp:      TempSrc:      Resp: 18 20 20 18   Height:      Weight:         Dilation: 6 Effacement (%): 80 Cervical Position: Middle Station: -2 Presentation: Vertex Exam by:: Henderson NewcomerStephanie Faulk, RN  Labs: Lab Results  Component Value Date   WBC 17.4* 11/25/2015   HGB 7.6* 11/25/2015   HCT 22.1* 11/25/2015   MCV 85.3 11/25/2015   PLT 163 11/25/2015    Patient Active Problem List   Diagnosis Date Noted  . Chronic hypertension in pregnancy 11/25/2015  . AMA (advanced maternal age) multigravida 35+ 07/30/2015  . Abscess of right breast 06/19/2015  . Anemia in pregnancy 06/19/2015  . Supervision of high risk pregnancy, antepartum 06/18/2015  . Previous cesarean delivery, antepartum condition or complication 06/18/2015  . Preexisting hypertension complicating pregnancy, antepartum 06/18/2015    Assessment / Plan: 45 y.o. [id number] at 5929w0d here for IOL (TOLAC) for cHTN.  Labor: 6/80/-2; s/p Foley; Continue Pitocin Fetal Wellbeing:  Category I Pain Control:  Well controlled.  Does not desire epidural. Anticipated MOD:  SVD  Jonni SangerSara Feldman, MS3 11/25/2015, 7:08 PM

## 2015-11-26 ENCOUNTER — Encounter (HOSPITAL_COMMUNITY): Payer: Self-pay

## 2015-11-26 ENCOUNTER — Encounter (HOSPITAL_COMMUNITY)
Admission: RE | Disposition: A | Discharge status: Home / Self Care | Payer: Self-pay | Source: Ambulatory Visit | Attending: Obstetrics and Gynecology

## 2015-11-26 ENCOUNTER — Inpatient Hospital Stay (HOSPITAL_COMMUNITY): Payer: Medicaid Other | Admitting: Registered Nurse

## 2015-11-26 DIAGNOSIS — O1092 Unspecified pre-existing hypertension complicating childbirth: Secondary | ICD-10-CM

## 2015-11-26 DIAGNOSIS — O09523 Supervision of elderly multigravida, third trimester: Secondary | ICD-10-CM

## 2015-11-26 DIAGNOSIS — Z302 Encounter for sterilization: Secondary | ICD-10-CM | POA: Diagnosis not present

## 2015-11-26 DIAGNOSIS — O9902 Anemia complicating childbirth: Secondary | ICD-10-CM

## 2015-11-26 DIAGNOSIS — O3663X0 Maternal care for excessive fetal growth, third trimester, not applicable or unspecified: Secondary | ICD-10-CM

## 2015-11-26 DIAGNOSIS — Z3A40 40 weeks gestation of pregnancy: Secondary | ICD-10-CM

## 2015-11-26 DIAGNOSIS — D509 Iron deficiency anemia, unspecified: Secondary | ICD-10-CM

## 2015-11-26 HISTORY — PX: TUBAL LIGATION: SHX77

## 2015-11-26 LAB — CBC
HCT: 32.3 % — ABNORMAL LOW (ref 36.0–46.0)
Hemoglobin: 11.1 g/dL — ABNORMAL LOW (ref 12.0–15.0)
MCH: 31.4 pg (ref 26.0–34.0)
MCHC: 34.4 g/dL (ref 30.0–36.0)
MCV: 91.5 fL (ref 78.0–100.0)
PLATELETS: 258 10*3/uL (ref 150–400)
RBC: 3.53 MIL/uL — AB (ref 3.87–5.11)
RDW: 13.8 % (ref 11.5–15.5)
WBC: 13.8 10*3/uL — AB (ref 4.0–10.5)

## 2015-11-26 SURGERY — LIGATION, FALLOPIAN TUBE, POSTPARTUM
Anesthesia: Spinal | Laterality: Bilateral

## 2015-11-26 MED ORDER — ONDANSETRON HCL 4 MG/2ML IJ SOLN: 4.0000 mg | Freq: Once | INTRAMUSCULAR | Status: DC | PRN

## 2015-11-26 MED ORDER — WITCH HAZEL-GLYCERIN EX PADS: 1.0000 "application " | MEDICATED_PAD | CUTANEOUS | Status: DC | PRN

## 2015-11-26 MED ORDER — LIDOCAINE HCL (CARDIAC) 20 MG/ML IV SOLN
INTRAVENOUS | Status: AC
Start: 2015-11-26 — End: 2015-11-26
  Filled 2015-11-26: qty 5

## 2015-11-26 MED ORDER — SENNOSIDES-DOCUSATE SODIUM 8.6-50 MG PO TABS
2.0000 | ORAL_TABLET | ORAL | Status: DC
  Administered 2015-11-27: 2 via ORAL
  Filled 2015-11-26: qty 2

## 2015-11-26 MED ORDER — DEXAMETHASONE SODIUM PHOSPHATE 4 MG/ML IJ SOLN
INTRAMUSCULAR | Status: DC | PRN
  Administered 2015-11-26: 4 mg via INTRAVENOUS

## 2015-11-26 MED ORDER — ONDANSETRON HCL 4 MG/2ML IJ SOLN
INTRAMUSCULAR | Status: DC | PRN
  Administered 2015-11-26: 4 mg via INTRAVENOUS

## 2015-11-26 MED ORDER — BUPIVACAINE IN DEXTROSE 0.75-8.25 % IT SOLN
INTRATHECAL | Status: DC | PRN
  Administered 2015-11-26: 1.6 mL via INTRATHECAL

## 2015-11-26 MED ORDER — MEASLES, MUMPS & RUBELLA VAC ~~LOC~~ INJ
0.5000 mL | INJECTION | Freq: Once | SUBCUTANEOUS | Status: DC
  Filled 2015-11-26: qty 0.5

## 2015-11-26 MED ORDER — TETANUS-DIPHTH-ACELL PERTUSSIS 5-2.5-18.5 LF-MCG/0.5 IM SUSP: 0.5000 mL | Freq: Once | INTRAMUSCULAR | Status: DC

## 2015-11-26 MED ORDER — ACETAMINOPHEN 325 MG PO TABS
650.0000 mg | ORAL_TABLET | ORAL | Status: DC | PRN
  Administered 2015-11-26: 650 mg via ORAL
  Filled 2015-11-26: qty 2

## 2015-11-26 MED ORDER — DIBUCAINE 1 % RE OINT: 1.0000 "application " | TOPICAL_OINTMENT | RECTAL | Status: DC | PRN

## 2015-11-26 MED ORDER — IBUPROFEN 600 MG PO TABS
600.0000 mg | ORAL_TABLET | Freq: Four times a day (QID) | ORAL | Status: DC
  Administered 2015-11-26 – 2015-11-27 (×5): 600 mg via ORAL
  Filled 2015-11-26 (×5): qty 1

## 2015-11-26 MED ORDER — KETOROLAC TROMETHAMINE 30 MG/ML IJ SOLN: 30.0000 mg | Freq: Once | INTRAMUSCULAR | Status: DC

## 2015-11-26 MED ORDER — MIDAZOLAM HCL 2 MG/2ML IJ SOLN
INTRAMUSCULAR | Status: AC
  Filled 2015-11-26: qty 2

## 2015-11-26 MED ORDER — PROPOFOL 10 MG/ML IV BOLUS
INTRAVENOUS | Status: AC
  Filled 2015-11-26: qty 20

## 2015-11-26 MED ORDER — DEXAMETHASONE SODIUM PHOSPHATE 4 MG/ML IJ SOLN
INTRAMUSCULAR | Status: AC
Start: 2015-11-26 — End: 2015-11-26
  Filled 2015-11-26: qty 1

## 2015-11-26 MED ORDER — BUPIVACAINE HCL (PF) 0.25 % IJ SOLN
INTRAMUSCULAR | Status: DC | PRN
  Administered 2015-11-26: 10 mL

## 2015-11-26 MED ORDER — MISOPROSTOL 200 MCG PO TABS
800.0000 ug | ORAL_TABLET | Freq: Once | ORAL | Status: AC
  Administered 2015-11-26: 800 ug via ORAL

## 2015-11-26 MED ORDER — MIDAZOLAM HCL 2 MG/2ML IJ SOLN
INTRAMUSCULAR | Status: DC | PRN
  Administered 2015-11-26: 1 mg via INTRAVENOUS

## 2015-11-26 MED ORDER — PROPOFOL 500 MG/50ML IV EMUL
INTRAVENOUS | Status: DC | PRN
  Administered 2015-11-26: 25 ug/kg/min via INTRAVENOUS

## 2015-11-26 MED ORDER — MISOPROSTOL 200 MCG PO TABS
ORAL_TABLET | ORAL | Status: AC
  Filled 2015-11-26: qty 4

## 2015-11-26 MED ORDER — DIPHENHYDRAMINE HCL 25 MG PO CAPS: 25.0000 mg | ORAL_CAPSULE | Freq: Four times a day (QID) | ORAL | Status: DC | PRN

## 2015-11-26 MED ORDER — BENZOCAINE-MENTHOL 20-0.5 % EX AERO: 1.0000 "application " | INHALATION_SPRAY | CUTANEOUS | Status: DC | PRN

## 2015-11-26 MED ORDER — HYDROMORPHONE HCL 1 MG/ML IJ SOLN
0.2500 mg | INTRAMUSCULAR | Status: DC | PRN
Start: 2015-11-26 — End: 2015-11-26

## 2015-11-26 MED ORDER — FENTANYL CITRATE (PF) 100 MCG/2ML IJ SOLN
INTRAMUSCULAR | Status: AC
Start: 2015-11-26 — End: 2015-11-26
  Filled 2015-11-26: qty 2

## 2015-11-26 MED ORDER — FENTANYL CITRATE (PF) 100 MCG/2ML IJ SOLN
INTRAMUSCULAR | Status: DC | PRN
  Administered 2015-11-26: 50 ug via INTRAVENOUS
  Administered 2015-11-26 (×2): 25 ug via INTRAVENOUS

## 2015-11-26 MED ORDER — BUPIVACAINE HCL (PF) 0.25 % IJ SOLN
INTRAMUSCULAR | Status: AC
  Filled 2015-11-26: qty 30

## 2015-11-26 MED ORDER — COCONUT OIL OIL: 1.0000 "application " | TOPICAL_OIL | Status: DC | PRN

## 2015-11-26 MED ORDER — CEFAZOLIN SODIUM-DEXTROSE 2-4 GM/100ML-% IV SOLN
2.0000 g | Freq: Once | INTRAVENOUS | Status: AC
  Administered 2015-11-26: 2 g via INTRAVENOUS
  Filled 2015-11-26: qty 100

## 2015-11-26 MED ORDER — ZOLPIDEM TARTRATE 5 MG PO TABS: 5.0000 mg | ORAL_TABLET | Freq: Every evening | ORAL | Status: DC | PRN

## 2015-11-26 MED ORDER — ONDANSETRON HCL 4 MG/2ML IJ SOLN
INTRAMUSCULAR | Status: AC
  Filled 2015-11-26: qty 2

## 2015-11-26 MED ORDER — FAMOTIDINE 20 MG PO TABS
40.0000 mg | ORAL_TABLET | Freq: Once | ORAL | Status: AC
  Administered 2015-11-26: 40 mg via ORAL
  Filled 2015-11-26: qty 2

## 2015-11-26 MED ORDER — METOCLOPRAMIDE HCL 10 MG PO TABS
10.0000 mg | ORAL_TABLET | Freq: Once | ORAL | Status: AC
  Administered 2015-11-26: 10 mg via ORAL
  Filled 2015-11-26: qty 1

## 2015-11-26 MED ORDER — ONDANSETRON HCL 4 MG PO TABS: 4.0000 mg | ORAL_TABLET | ORAL | Status: DC | PRN

## 2015-11-26 MED ORDER — MEPERIDINE HCL 25 MG/ML IJ SOLN: 6.2500 mg | INTRAMUSCULAR | Status: DC | PRN

## 2015-11-26 MED ORDER — SIMETHICONE 80 MG PO CHEW: 80.0000 mg | CHEWABLE_TABLET | ORAL | Status: DC | PRN

## 2015-11-26 MED ORDER — PRENATAL MULTIVITAMIN CH
1.0000 | ORAL_TABLET | Freq: Every day | ORAL | Status: DC
  Administered 2015-11-27: 1 via ORAL
  Filled 2015-11-26: qty 1

## 2015-11-26 MED ORDER — FERROUS SULFATE 325 (65 FE) MG PO TABS
325.0000 mg | ORAL_TABLET | Freq: Three times a day (TID) | ORAL | Status: DC
  Administered 2015-11-26 – 2015-11-27 (×3): 325 mg via ORAL
  Filled 2015-11-26 (×3): qty 1

## 2015-11-26 MED ORDER — LACTATED RINGERS IV SOLN
INTRAVENOUS | Status: DC
  Administered 2015-11-26: 15:00:00 via INTRAVENOUS

## 2015-11-26 MED ORDER — ONDANSETRON HCL 4 MG/2ML IJ SOLN: 4.0000 mg | INTRAMUSCULAR | Status: DC | PRN

## 2015-11-26 SURGICAL SUPPLY — 24 items
CHLORAPREP W/TINT 26ML (MISCELLANEOUS) ×1 IMPLANT
CONTAINER PREFILL 10% NBF 15ML (MISCELLANEOUS) ×4 IMPLANT
DRSG OPSITE POSTOP 3X4 (GAUZE/BANDAGES/DRESSINGS) ×3 IMPLANT
DURAPREP 26ML APPLICATOR (WOUND CARE) ×2 IMPLANT
GLOVE BIOGEL PI IND STRL 6.5 (GLOVE) ×1 IMPLANT
GLOVE BIOGEL PI IND STRL 7.0 (GLOVE) ×1 IMPLANT
GLOVE BIOGEL PI INDICATOR 6.5 (GLOVE) ×2
GLOVE BIOGEL PI INDICATOR 7.0 (GLOVE) ×2
GLOVE SURG SS PI 6.0 STRL IVOR (GLOVE) ×3 IMPLANT
GOWN STRL REUS W/TWL LRG LVL3 (GOWN DISPOSABLE) ×6 IMPLANT
NEEDLE HYPO 22GX1.5 SAFETY (NEEDLE) ×2 IMPLANT
NS IRRIG 1000ML POUR BTL (IV SOLUTION) ×3 IMPLANT
PACK ABDOMINAL MINOR (CUSTOM PROCEDURE TRAY) ×3 IMPLANT
SPONGE GAUZE 2X2 8PLY STER LF (GAUZE/BANDAGES/DRESSINGS) ×1
SPONGE GAUZE 2X2 8PLY STRL LF (GAUZE/BANDAGES/DRESSINGS) ×2 IMPLANT
SPONGE LAP 4X18 X RAY DECT (DISPOSABLE) ×2 IMPLANT
SUT PLAIN 0 NONE (SUTURE) ×3 IMPLANT
SUT VIC AB 0 CT1 27 (SUTURE) ×3
SUT VIC AB 0 CT1 27XBRD ANBCTR (SUTURE) ×1 IMPLANT
SUT VIC AB 3-0 PS2 18 (SUTURE) ×3 IMPLANT
SYR CONTROL 10ML LL (SYRINGE) ×2 IMPLANT
TOWEL OR 17X24 6PK STRL BLUE (TOWEL DISPOSABLE) ×6 IMPLANT
TRAY FOLEY CATH SILVER 14FR (SET/KITS/TRAYS/PACK) ×3 IMPLANT
WATER STERILE IRR 1000ML POUR (IV SOLUTION) ×3 IMPLANT

## 2015-11-26 NOTE — Anesthesia Postprocedure Evaluation (Signed)
Anesthesia Post Note  Patient: Christy Hernandez  Procedure(s) Performed: Procedure(s) (LRB): POST PARTUM TUBAL LIGATION (Bilateral)  Patient location during evaluation: PACU Anesthesia Type: Spinal Level of consciousness: awake Pain management: pain level controlled Respiratory status: spontaneous breathing Cardiovascular status: stable Postop Assessment: no headache, no backache, spinal receding, patient able to bend at knees and no signs of nausea or vomiting Anesthetic complications: no     Last Vitals:  Filed Vitals:   11/26/15 1215 11/26/15 1230  BP: 103/62 88/51  Pulse: 92 78  Temp:    Resp: 17 15    Last Pain:  Filed Vitals:   11/26/15 1233  PainSc: Asleep   Pain Goal: Patients Stated Pain Goal: 3 (11/26/15 0941)               Idabelle Mcpeters JR,JOHN Susann GivensFRANKLIN

## 2015-11-26 NOTE — Anesthesia Preprocedure Evaluation (Signed)
Anesthesia Evaluation  Patient identified by MRN, date of birth, ID band Patient awake    Reviewed: Allergy & Precautions, H&P , Patient's Chart, lab work & pertinent test results  Airway Mallampati: II  TM Distance: >3 FB Neck ROM: full    Dental no notable dental hx.    Pulmonary neg pulmonary ROS,    Pulmonary exam normal        Cardiovascular Normal cardiovascular exam     Neuro/Psych negative neurological ROS  negative psych ROS   GI/Hepatic negative GI ROS, Neg liver ROS,   Endo/Other  negative endocrine ROS  Renal/GU negative Renal ROS     Musculoskeletal   Abdominal (+) + obese,   Peds  Hematology   Anesthesia Other Findings   Reproductive/Obstetrics                             Anesthesia Physical Anesthesia Plan  ASA: II  Anesthesia Plan: Spinal   Post-op Pain Management:    Induction:   Airway Management Planned:   Additional Equipment:   Intra-op Plan:   Post-operative Plan:   Informed Consent: I have reviewed the patients History and Physical, chart, labs and discussed the procedure including the risks, benefits and alternatives for the proposed anesthesia with the patient or authorized representative who has indicated his/her understanding and acceptance.     Plan Discussed with: CRNA and Surgeon  Anesthesia Plan Comments:         Anesthesia Quick Evaluation

## 2015-11-26 NOTE — Progress Notes (Signed)
45 y.o. yo [id number]  with undesired fertility,status post vaginal delivery, desires permanent sterilization. Risks and benefits of procedure discussed with patient including permanence of method, bleeding, infection, injury to surrounding organs and need for additional procedures. Risk failure of 0.5-1% with increased risk of ectopic gestation if pregnancy occurs was also discussed with patient.

## 2015-11-26 NOTE — Transfer of Care (Signed)
Immediate Anesthesia Transfer of Care Note  Patient: Christy Hernandez  Procedure(s) Performed: Procedure(s): POST PARTUM TUBAL LIGATION (Bilateral)  Patient Location: PACU  Anesthesia Type:Spinal  Level of Consciousness: awake, alert , oriented and patient cooperative  Airway & Oxygen Therapy: Patient Spontanous Breathing  Post-op Assessment: Report given to RN and Post -op Vital signs reviewed and stable  Post vital signs: Reviewed and stable  Last Vitals:  Filed Vitals:   11/26/15 0815 11/26/15 0941  BP: 114/53 111/51  Pulse: 100 97  Temp: 38.3 C 36.8 C  Resp: 18 18    Last Pain:  Filed Vitals:   11/26/15 1023  PainSc: 10-Worst pain ever      Patients Stated Pain Goal: 3 (11/26/15 0941)  Complications: No apparent anesthesia complications

## 2015-11-26 NOTE — Lactation Note (Signed)
This note was copied from a baby's chart. Lactation Consultation Note Initial visit at 18 hours of age initially with IPAD interpreter and then [place] interpreter, Nettie ElmSylvia, arrived at bedside.  Mom has large soft breast with everted nipples that slightly inverted in center of nipples. Mom reports low milk supply with older children, LC suspects due to early supplementation.  Mom reports continuing to breastfeed mostly at night and formula feedings during the day. LC discussed supply and demand at length with mom to establish a good milk supply.  Mom is tired as she is recovering from tubal this am.  LC assisted with cross cradle hold on left breast.  MOm initially allows a shallow latch with cradle hold and report some pain.  Baby latched well with intermittent sucking and mom reports improved comfort with latch.  [facility] given and discussed.  Encouraged to feed with early cues on demand.  Early newborn behavior discussed.  Hand expression demonstrated by mom with colostrum visible.  Mom to call for assist as needed.  Mom noted to have 2 scars on right breast from an infection 2 years ago per mom.  Mom denies further complications.      Patient Name: Christy Hernandez Today's Date: 11/26/2015 Reason for consult: Initial assessment   Maternal Data Has patient been taught Hand Expression?: Yes Does the patient have breastfeeding experience prior to this delivery?: Yes  Feeding Feeding Type: Breast Fed Nipple Type: Slow - flow Length of feed:  (observed several minutes)  LATCH Score/Interventions Latch: Grasps breast easily, tongue down, lips flanged, rhythmical sucking. Intervention(s): Adjust position;Assist with latch;Breast massage;Breast compression  Audible Swallowing: A few with stimulation Intervention(s): Skin to skin;Hand expression Intervention(s): Skin to skin;Hand expression  Type of Nipple: Everted at rest and after stimulation  Comfort (Breast/Nipple): Soft /  non-tender     Hold (Positioning): Assistance needed to correctly position infant at breast and maintain latch. Intervention(s): Breastfeeding basics reviewed;Support Pillows;Position options;Skin to skin  LATCH Score: 8  Lactation Tools Discussed/Used WIC Program: Yes   Consult Status Consult Status: Follow-up Date: 11/27/15 Follow-up type: In-patient    Shoptaw, Arvella MerlesJana Lynn 11/26/2015, 6:50 PM

## 2015-11-26 NOTE — Anesthesia Procedure Notes (Signed)
Spinal Patient location during procedure: OR Start time: 11/26/2015 11:08 AM End time: 11/26/2015 11:10 AM Staffing Anesthesiologist: Leilani AbleHATCHETT, Joei Frangos Performed by: anesthesiologist  Preanesthetic Checklist Completed: patient identified, surgical consent, pre-op evaluation, timeout performed, IV checked, risks and benefits discussed and monitors and equipment checked Spinal Block Patient position: sitting Prep: site prepped and draped and DuraPrep Patient monitoring: heart rate, cardiac monitor, continuous pulse ox and blood pressure Approach: midline Location: L3-4 Injection technique: single-shot Needle Needle type: Sprotte  Needle gauge: 24 G Needle length: 9 cm Needle insertion depth: 5 cm Assessment Sensory level: T6

## 2015-11-26 NOTE — Op Note (Signed)
PREOPERATIVE DIAGNOSIS:  Undesired fertility  POSTOPERATIVE DIAGNOSIS:  Undesired fertility  PROCEDURE:  Postpartum Bilateral Tubal Sterilization using Pomeroy method   ANESTHESIA:  Epidural  COMPLICATIONS:  None immediate.  ESTIMATED BLOOD LOSS:  Less than 20cc.  FLUIDS: 1000 cc LR.  INDICATIONS: 45 y.o. yo [id number]  with undesired fertility,status post vaginal delivery, desires permanent sterilization. Risks and benefits of procedure discussed with patient including permanence of method, bleeding, infection, injury to surrounding organs and need for additional procedures. Risk failure of 0.5-1% with increased risk of ectopic gestation if pregnancy occurs was also discussed with patient.   FINDINGS:  Normal uterus, tubes, and ovaries.  TECHNIQUE: After informed consent was obtained, the patient was taken to the operating room where anesthesia was induced and found to be adequate. A small transverse, infraumbilical skin incision was made with the scalpel. This incision was carried down to the underlying layer of fascia. The fascia was grasped with Kocher clamps tented up and entered sharply with Mayo scissors. Underlying peritoneum was then identified tented up and entered sharply with Metzenbaum scissors. The fascia was tagged with 0 Vicryl. The patient's left fallopian tube was then identified, brought to the incision, and grasped with a Babcock clamp. The tube was then followed out to the fimbria. The Babcock clamp was then used to grasp the tube approximately 4 cm from the cornual region. A 3 cm segment of the tube was then ligated with free tie of plain gut suture, transected and excised. Good hemostasis was noted and the tube was returned to the abdomen. The right fallopian tube was then identified to its fimbriated end, ligated, and a 3 cm segment excised in a similar fashion. Excellent hemostasis was noted, and the tube returned to the abdomen. The fascia was re-approximated with 0 Vicryl.  The skin was closed in a subcuticular fashion with 3-0 Vicryl. Quarter percent Marcaine solution was then injected at the incision site. The patient tolerated the procedure well. Sponge, lap, and needle count were correct x2. The patient was taken to recovery room in stable condition.

## 2015-11-27 ENCOUNTER — Encounter (HOSPITAL_COMMUNITY): Payer: Self-pay | Admitting: Obstetrics and Gynecology

## 2015-11-27 DIAGNOSIS — O34219 Maternal care for unspecified type scar from previous cesarean delivery: Secondary | ICD-10-CM | POA: Diagnosis not present

## 2015-11-27 MED ORDER — OXYCODONE-ACETAMINOPHEN 5-325 MG PO TABS: 1.0000 | ORAL_TABLET | Freq: Four times a day (QID) | ORAL | Status: DC | PRN

## 2015-11-27 MED ORDER — LISINOPRIL 10 MG PO TABS: 10.0000 mg | ORAL_TABLET | Freq: Every day | ORAL | Status: DC

## 2015-11-27 MED ORDER — IBUPROFEN 600 MG PO TABS: 600.0000 mg | ORAL_TABLET | Freq: Four times a day (QID) | ORAL | Status: DC

## 2015-11-27 NOTE — Discharge Instructions (Signed)

## 2015-11-27 NOTE — Progress Notes (Signed)
Patient wants her daughter to order her meals. Christy Hernandez  Interpreter.

## 2015-11-27 NOTE — Anesthesia Postprocedure Evaluation (Signed)
Anesthesia Post Note  Patient: Christy Hernandez  Procedure(s) Performed: Procedure(s) (LRB): POST PARTUM TUBAL LIGATION (Bilateral)  Patient location during evaluation: Mother Baby Anesthesia Type: Spinal Level of consciousness: oriented and awake and alert Pain management: pain level controlled Vital Signs Assessment: post-procedure vital signs reviewed and stable Respiratory status: spontaneous breathing and respiratory function stable Cardiovascular status: blood pressure returned to baseline and stable Postop Assessment: no headache, no backache, patient able to bend at knees and no signs of nausea or vomiting Anesthetic complications: no     Last Vitals:  Filed Vitals:   11/27/15 0130 11/27/15 0530  BP: 110/60 118/58  Pulse: 86 74  Temp: 36.7 C 36.7 C  Resp: 20 18    Last Pain:  Filed Vitals:   11/27/15 0539  PainSc: 0-No pain   Pain Goal: Patients Stated Pain Goal: 3 (11/26/15 0941)               Rica RecordsICKELTON,[name]

## 2015-11-27 NOTE — Lactation Note (Signed)
This note was copied from a baby's chart. Lactation Consultation Note:  Infant is 6935 hours old. Mother speaks spanish and understands little english.  Mothers 221 yr old daughter in room . Mother states that she prefers that daughter translate . Mother states that infant wanted to eat all night . She gave formula early am. She has concerns about milk supply. Advised mother that this is normal feeding behavior and it is call cluster feeding.  Mother was given a hand pump with instructions.  Suggested that she post pump for 15 mins on each breast to have extra breastmilk instead of using formula.  Mother was receptive to all teaching. She denies having any questions.     Patient Name: Christy Hernandez QMVHQ'IToday's Date: 11/27/2015 Reason for consult: Follow-up assessment   Maternal Data    Feeding    LATCH Score/Interventions                      Lactation Tools Discussed/Used     Consult Status Consult Status: Complete    Christy Hernandez, Christy Hernandez 11/27/2015, 11:28 AM

## 2015-11-27 NOTE — Addendum Note (Signed)
Addendum  created 11/27/15 0759 by Rica RecordsAngela Pahoua Schreiner, CRNA   Modules edited: Clinical Notes   Clinical Notes:  File: [id number]

## 2015-11-27 NOTE — Progress Notes (Signed)
POSTPARTUM PROGRESS NOTE  Post Partum Day 1 Subjective:  Christy Hernandez is a 45 y.o. [id number] 883w1d s/p VBAC and IOL for cHTN.  No acute events overnight.  Pt denies problems with ambulating, voiding or po intake.  She denies nausea or vomiting.  Pain is well controlled.  She has had flatus. She has had bowel movement.  Lochia Small.   Objective: Blood pressure 118/58, pulse 74, temperature 98 F (36.7 C), temperature source Oral, resp. rate 18, height 5\' 2"  (1.575 m), weight 76.658 kg (169 lb), last menstrual period 03/01/2015, SpO2 98 %, unknown if currently breastfeeding.  Physical Exam:  General: alert, cooperative and no distress Lochia:normal flow Chest: CTAB Heart: RRR no m/r/g Abdomen: +BS, soft, nontender,  Uterine Fundus: firm, at umbilicus DVT Evaluation: No calf swelling or tenderness Extremities: no edema   Recent Labs  11/25/15 0750 11/26/15 0625  HGB 7.6* 11.1*  HCT 22.1* 32.3*    Assessment/Plan:  ASSESSMENT: Christy MedianMaria P Angelina PihValadez Matsushita is a 45 y.o. [id number] 343w1d s/p VBAC and BTL. She is doing very well with no acute complaints. She received two units of RBCs and her Hb is appropriately rising, she denies any lightheadedness or problems ambulating. She would like to leave, but has been informed that she will need to be observed a little longer given the surgery and RBC transfusion.  Plan for discharge tomorrow   LOS: 2 days   Malka Sohomas Daubert 11/27/2015, 7:54 AM   OB fellow attestation Post Partum Day 1 I have seen and examined this patient and agree with above documentation in the resident's note.   Christy Hernandez is a 45 y.o. [phone] s/p VBAC.  Pt denies problems with ambulating, voiding or po intake. Pain is well controlled.  Plan for birth control is bilateral tubal ligation.  Method of Feeding: Breast  PE:  BP 107/58 mmHg  Pulse 71  Temp(Src) 97.9 F (36.6 C) (Oral)  Resp 18  Ht 5\' 2"  (1.575 m)  Wt 169 lb (76.658 kg)  BMI 30.90  kg/m2  SpO2 98%  LMP 03/01/2015 (Exact Date)  Breastfeeding? Unknown Gen: well appearing Heart: reg rate Lungs: normal WOB Fundus firm Ext: soft, no pain, no edema  Plan for discharge: today see discharge summary  Federico FlakeKimberly Niles Osten Janek, MD 7:15 PM

## 2015-11-27 NOTE — Discharge Summary (Signed)
OB Discharge Summary  Patient Name: Christy Hernandez DOB: [birth year] MRN: [id number]  Date of admission: 11/25/2015 Delivering MD: Antionette Poles   Date of discharge: 11/27/2015  Admitting diagnosis: INDUCTION desires sterilizaation Intrauterine pregnancy: [redacted]w[redacted]d     Secondary diagnosis:Principal Problem:   VBAC (vaginal birth after Cesarean) Active Problems:   Supervision of high risk pregnancy, antepartum   Previous cesarean delivery, antepartum condition or complication   Preexisting hypertension complicating pregnancy, antepartum   Anemia in pregnancy   AMA (advanced maternal age) multigravida 35+   Chronic hypertension in pregnancy   Encounter for sterilization  Additional problems: None    Discharge diagnosis: VBAC and CHTN                                                                     Post partum procedures:postpartum tubal ligation  Augmentation: Pitocin and Foley Balloon  Complications: None  Hospital course:  Induction of Labor With Vaginal Delivery   45 y.o. yo [id number] at [redacted]w[redacted]d was admitted to the hospital 11/25/2015 for induction of labor.  Indication for induction: Gestational hypertension.  Patient had an uncomplicated labor course as follows: Membrane Rupture Time/Date: 12:05 AM ,11/26/2015   Intrapartum Procedures: Episiotomy: None [1]                                         Lacerations:  1st degree [2]  Patient had delivery of a Viable infant.  Information for the patient's newborn:  [place], [place] [[id number]]  Delivery Method: VBAC, Spontaneous (Filed from Delivery Summary)    11/26/2015  Details of delivery can be found in separate delivery note.  Patient had a routine postpartum course. Patient is discharged home 11/27/2015.   Physical exam  Filed Vitals:   11/27/15 0130 11/27/15 0530 11/27/15 0910 11/27/15 1230  BP: 110/60 118/58 122/80 107/58  Pulse: 86 74 82 71  Temp: 98.1 F (36.7 C) 98 F (36.7 C) 97.8 F (36.6  C) 97.9 F (36.6 C)  TempSrc: Oral Oral Axillary Oral  Resp: Height:      Weight:      SpO2: 98% 98% 98%    General: alert, cooperative and no distress Lochia: appropriate Uterine Fundus: firm Incision: N/A DVT Evaluation: No evidence of DVT seen on physical exam. Labs: Lab Results  Component Value Date   WBC 13.8* 11/26/2015   HGB 11.1* 11/26/2015   HCT 32.3* 11/26/2015   MCV 91.5 11/26/2015   PLT 258 11/26/2015   CMP Latest Ref Rng 07/30/2015  Glucose 65 - 99 mg/dL 72  BUN 7 - 25 mg/dL 4(L)  Creatinine 6.96 - 1.10 mg/dL 2.95(M)  Sodium 841 - 324 mmol/L 138  Potassium 3.5 - 5.3 mmol/L 4.1  Chloride 98 - 110 mmol/L 107  CO2 20 - 31 mmol/L 20  Calcium 8.6 - 10.2 mg/dL 8.3(L)  Total Protein 6.1 - 8.1 g/dL 4.0(N)  Total Bilirubin 0.2 - 1.2 mg/dL 0.3  Alkaline Phos 33 - 115 U/L 43  AST 10 - 35 U/L 15  ALT 6 - 29 U/L 15    Discharge instruction:  per After Visit Summary and "Baby and Me Booklet".  After Visit Meds:    Medication List    STOP taking these medications        aspirin EC 81 MG EC tablet  Generic drug:  aspirin      TAKE these medications        ferrous sulfate 325 (65 FE) MG tablet  Take 325 mg by mouth daily with breakfast.     ibuprofen 600 MG tablet  Commonly known as:  ADVIL,MOTRIN  Take 1 tablet (600 mg total) by mouth every 6 (six) hours.     lisinopril 10 MG tablet  Commonly known as:  PRINIVIL  Take 1 tablet (10 mg total) by mouth daily.     oxyCODONE-acetaminophen 5-325 MG tablet  Commonly known as:  PERCOCET/ROXICET  Take 1-2 tablets by mouth every 6 (six) hours as needed.     PRENATAL VITAMINS PLUS 27-1 MG Tabs  Take 1 tablet by mouth once.        Diet: routine diet  Activity: Advance as tolerated. Pelvic rest for 6 weeks.   Outpatient follow up: Follow-up in 1 week for BP check, then follow-up in 6 weeks for postpartum appointment. Follow up Appt: Future Appointments Date Time Provider Department Center   01/06/2016 1:15 PM Adam PhenixJames G Arnold, MD [facility]   Follow up visit: No Follow-up on file.   Chronic Hypertension: Restarted Lisinopril PP. BP check in [facility] in 1 week.  Postpartum contraception: Tubal Ligation  Newborn Data: Live born female  Birth Weight: 7 lb 15.2 oz (3605 g) APGAR: 9, 10  Baby Feeding: Breast Disposition:home with mother   11/27/2015 Federico FlakeKimberly Niles Obrien Huskins, MD  OB fellow attestation I have seen and examined this patient and agree with above documentation in the resident's note.   Atilano MedianMaria P Angelina PihValadez Mccrory is a 45 y.o. [phone] s/p VBAC.   Pain is well controlled.  Plan for birth control is bilateral tubal ligation.  Method of Feeding: breast  PE:  BP 107/58 mmHg  Pulse 71  Temp(Src) 97.9 F (36.6 C) (Oral)  Resp 18  Ht 5\' 2"  (1.575 m)  Wt 169 lb (76.658 kg)  BMI 30.90 kg/m2  SpO2 98%  LMP 03/01/2015 (Exact Date)  Breastfeeding? Unknown Gen: well appearing Heart: reg rate Lungs: normal WOB Fundus firm Ext: soft, no pain, no edema   Recent Labs  11/25/15 0750 11/26/15 0625  HGB 7.6* 11.1*  HCT 22.1* 32.3*   Plan: discharge today - postpartum care discussed - f/u clinic in 6 weeks for postpartum visit   Federico FlakeKimberly Niles Tacarra Justo, MD 7:41 PM

## 2015-11-28 LAB — TYPE AND SCREEN
ABO/RH(D): O POS
ANTIBODY SCREEN: NEGATIVE
Unit division: 0
Unit division: 0

## 2015-12-01 ENCOUNTER — Ambulatory Visit: Payer: Medicaid Other | Admitting: General Practice

## 2015-12-01 VITALS — BP 146/81 | HR 73 | Ht 62.0 in | Wt 163.0 lb

## 2015-12-01 DIAGNOSIS — Z0130 Encounter for examination of blood pressure without abnormal findings: Secondary | ICD-10-CM

## 2015-12-01 NOTE — Progress Notes (Signed)
[employer] interpreter [phone] used for today's visit. Patient here for BP check today. Patient reports feeling fine & had a slight headache that started yesterday. Patient states she is not taking BP medication right now & was instructed to wait two weeks. Reviewed patient's blood pressure with Sharen CounterLisa Leftwich-Kirby who advised patient starting lisinopril today & return for BP check next week. Informed patient & also discussed warning signs of need to call us back. Patient verbalized understanding & is aware she needs to return in one week for repeat bp check. Patient asked about removing honeycomb dressing. Instructed patient she may remove it one week following surgery. Patient verbalized understanding & had no questions

## 2015-12-08 ENCOUNTER — Ambulatory Visit: Payer: Medicaid Other

## 2015-12-08 DIAGNOSIS — Z0130 Encounter for examination of blood pressure without abnormal findings: Secondary | ICD-10-CM

## 2015-12-08 NOTE — Progress Notes (Signed)
Pt presented today for a blood pressure check. B/p is 142/77. Pt stated she did not take her lisinopril today but plans to take she she gets home today. Pt stated she has been feeling fine. She will follow up at her next appointment in June.

## 2016-01-06 ENCOUNTER — Encounter: Payer: Self-pay | Admitting: Obstetrics & Gynecology

## 2016-01-06 ENCOUNTER — Ambulatory Visit (INDEPENDENT_AMBULATORY_CARE_PROVIDER_SITE_OTHER): Payer: Medicaid Other | Admitting: Obstetrics & Gynecology

## 2016-01-06 VITALS — BP 147/80 | HR 83 | Wt 152.0 lb

## 2016-01-06 DIAGNOSIS — I10 Essential (primary) hypertension: Secondary | ICD-10-CM

## 2016-01-06 MED ORDER — LISINOPRIL-HYDROCHLOROTHIAZIDE 10-12.5 MG PO TABS: 1.0000 | ORAL_TABLET | Freq: Every day | ORAL | Status: DC

## 2016-01-06 NOTE — Progress Notes (Signed)
Patient ID: Christy Hernandez, female   DOB: [birth year], 45 y.o.   MRN: [id number] Subjective:ran out of lisinopril one week ago     Christy MedianMaria P Angelina PihValadez Hernandez is a 45 y.o. female who presents for a postpartum visit. She is 6 weeks postpartum following a 11/26/2015.  I have fully reviewed the prenatal and intrapartum course. The delivery was at  gestational weeks. Outcome: VBAC. Anesthesia:   Postpartum course has been uncomplicated. Baby's course has been uncomplicated. Baby is feeding by breast/bottle. Bleeding not at this time. Bowel function is normal . Bladder function is normal. Patient is sexually active. Contraception method is Tubal Ligation. Postpartum depression screening:Negative    The following portions of the patient's history were reviewed and updated as appropriate: allergies, current medications, past family history, past medical history, past social history, past surgical history and problem list.  Review of Systems Pertinent items are noted in HPI.   Objective:    BP 147/80 mmHg  Pulse 83  Wt 152 lb (68.947 kg)  General:  alert, cooperative and no distress           Abdomen: soft, non-tender; bowel sounds normal; no masses,  no organomegaly and incision healing well   Vulva:  not evaluated  Vagina: not evaluated   Assessment:     normal postpartum exam. Pap smear not done at today's visit.   Hypertension which needs treatment  Plan:    1. Contraception: tubal ligation 2. Restart her HCTZ lisinopril 3. Follow up as needed.     Adam PhenixJames G Arnold, MD 01/06/2016

## 2016-01-06 NOTE — Patient Instructions (Signed)
Hipertensin (Hypertension) La hipertensin, conocida comnmente como presin arterial alta, se produce cuando la sangre bombea en las arterias con mucha fuerza. Las arterias son los vasos sanguneos que transportan la sangre desde el corazn hacia todas las partes del cuerpo. Una lectura de la presin arterial consiste en un nmero ms alto sobre un nmero ms bajo, por ejemplo, 110/72. El nmero ms alto (presin sistlica) corresponde a la presin interna de las arterias cuando el corazn bombea sangre. El nmero ms bajo (presin diastlica) corresponde a la presin interna de las arterias cuando el corazn se relaja. En condiciones ideales, la presin arterial debe ser inferior a 120/80. La hipertensin fuerza al corazn a trabajar ms para bombear la sangre. Las arterias pueden estrecharse o ponerse rgidas. La hipertensin no tratada o no controlada puede causar infarto de miocardio, ictus, enfermedad renal y otros problemas. FACTORES DE RIESGO Algunos factores de riesgo de hipertensin son controlables, pero otros no lo son.  Entre los factores de riesgo que usted no puede controlar, se incluyen los siguientes:   La raza. El riesgo es mayor para las personas afroamericanas.  La edad. Los riesgos aumentan con la edad.  El sexo. Antes de los 45aos, los hombres corren ms riesgo que las mujeres. Despus de los 65aos, las mujeres corren ms riesgo que los hombres. Entre los factores de riesgo que usted puede controlar, se incluyen los siguientes:  No hacer la cantidad suficiente de actividad fsica o ejercicio.  Tener sobrepeso.  Consumir mucha grasa, azcar, caloras o sal en la dieta.  Beber alcohol en exceso. SIGNOS Y SNTOMAS Por lo general, la hipertensin no causa signos o sntomas. La hipertensin arterial demasiado alta (crisis hipertensiva) puede causar dolor de cabeza, ansiedad, falta de aire y hemorragia nasal. DIAGNSTICO Para detectar si usted tiene hipertensin, el  mdico le medir la presin arterial mientras est sentado, con el brazo levantado a la altura del corazn. Debe medirla al menos dos veces en el mismo brazo. Determinadas condiciones pueden causar una diferencia de presin arterial entre el brazo izquierdo y el derecho. El hecho de tener una sola lectura de la presin arterial ms alta que lo normal no significa que necesita un tratamiento. Si no est claro si tiene hipertensin arterial, es posible que se le pida que regrese otro da para volver a controlarle la presin arterial. O bien se le puede pedir que se controle la presin arterial en su casa durante 1 o ms meses. TRATAMIENTO El tratamiento de la hipertensin arterial incluye hacer cambios en el estilo de vida y, posiblemente, tomar medicamentos. Un estilo de vida saludable puede ayudar a bajar la presin arterial alta. Quiz deba cambiar algunos hbitos. Los cambios en el estilo de vida pueden incluir lo siguiente:  Seguir la dieta DASH. Esta dieta tiene un alto contenido de frutas, verduras y cereales integrales. Incluye poca cantidad de sal, carnes rojas y azcares agregados.  Mantenga el consumo de sodio por debajo de 2300 mg por da.  Realizar al menos entre 30 y 45 minutos de ejercicio aerbico, 4 veces por semana como mnimo.  Perder peso, si es necesario.  No fumar.  Limitar el consumo de bebidas alcohlicas.  Aprender formas de reducir el estrs. El mdico puede recetarle medicamentos si los cambios en el estilo de vida no son suficientes para lograr controlar la presin arterial y si una de las siguientes afirmaciones es verdadera:  Tiene entre 18 y 59 aos y su presin arterial sistlica est por encima de 140.  Tiene   60 aos o ms y su presin arterial sistlica est por encima de 150.  Su presin arterial diastlica est por encima de 90.  Tiene diabetes y su presin arterial sistlica est por encima de 140 o su presin arterial diastlica est por encima de  90.  Tiene una enfermedad renal y su presin arterial est por encima de 140/90.  Tiene una enfermedad cardaca y su presin arterial est por encima de 140/90. La presin arterial deseada puede variar en funcin de las enfermedades, la edad y otros factores personales. INSTRUCCIONES PARA EL CUIDADO EN EL HOGAR  Haga que le midan de nuevo la presin arterial segn las indicaciones del mdico.  Tome los medicamentos solamente como se lo haya indicado el mdico. Siga cuidadosamente las indicaciones. Los medicamentos para la presin arterial deben tomarse segn las indicaciones. Los medicamentos pierden eficacia al omitir las dosis. El hecho de omitir las dosis tambin aumenta el riesgo de otros problemas.  No fume.  Contrlese la presin arterial en su casa segn las indicaciones del mdico. SOLICITE ATENCIN MDICA SI:   Piensa que tiene una reaccin alrgica a los medicamentos.  Tiene mareos o dolores de cabeza con recurrencia.  Tiene hinchazn en los tobillos.  Tiene problemas de visin. SOLICITE ATENCIN MDICA DE INMEDIATO SI:  Siente un dolor de cabeza intenso o confusin.  Siente debilidad inusual, adormecimiento o que se desmayar.  Siente dolor intenso en el pecho o en el abdomen.  Vomita repetidas veces.  Tiene dificultad para respirar. ASEGRESE DE QUE:   Comprende estas instrucciones.  Controlar su afeccin.  Recibir ayuda de inmediato si no mejora o si empeora.   Esta informacin no tiene como fin reemplazar el consejo del mdico. Asegrese de hacerle al mdico cualquier pregunta que tenga.   Document Released: 07/04/2005 Document Revised: 11/18/2014 Elsevier Interactive Patient Education 2016 Elsevier Inc.  

## 2016-01-26 ENCOUNTER — Encounter: Payer: Self-pay | Admitting: *Deleted

## 2016-04-12 ENCOUNTER — Encounter: Payer: Self-pay | Admitting: *Deleted

## 2016-05-02 ENCOUNTER — Other Ambulatory Visit: Payer: Self-pay | Admitting: General Surgery

## 2016-07-28 IMAGING — US US MFM OB FOLLOW-UP
1 series · 14 of 28 positions shown · non-contrast
Comparison: none

[Series 1: us mfm ob follow-up · 14 of 40 slices shown]
[im 2/40]
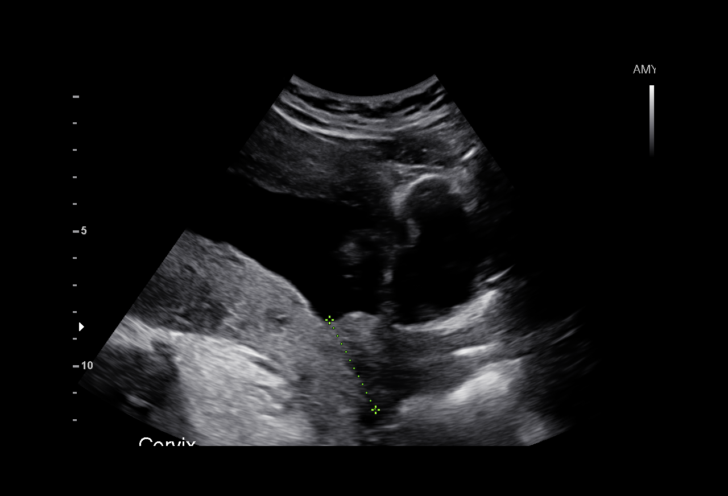
[im 5/40]
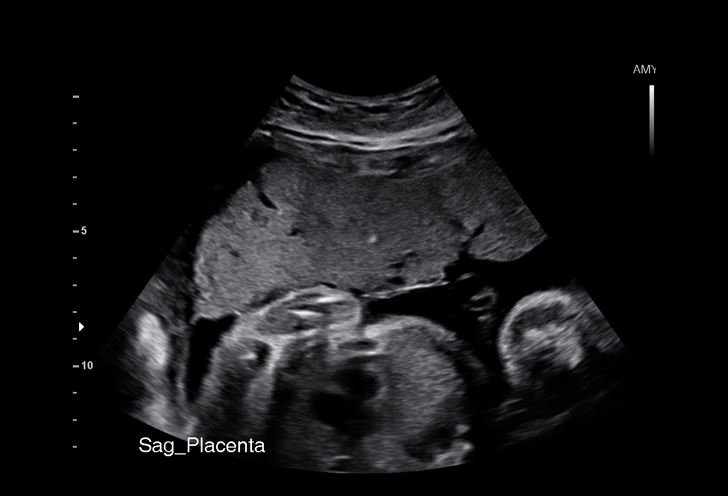
[im 8/40]
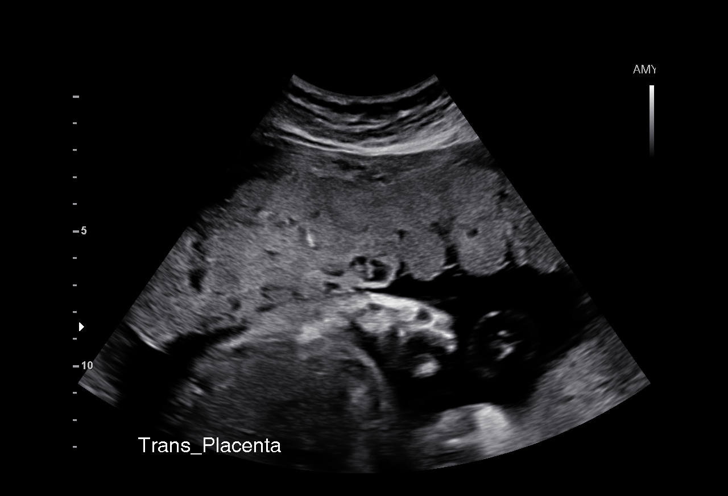
[im 11/40]
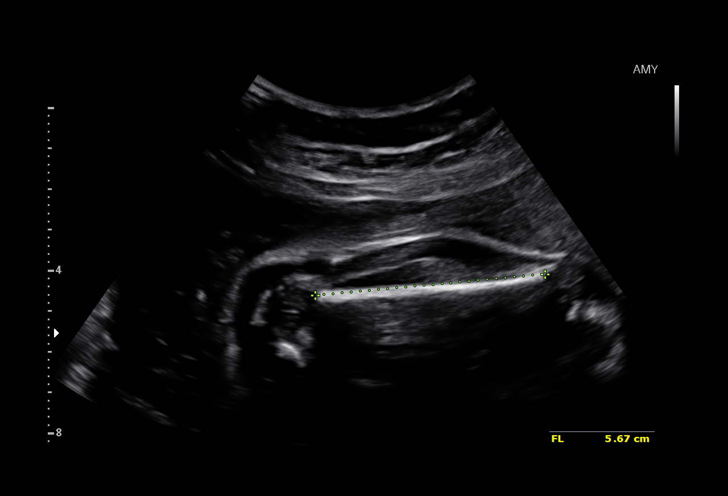
[im 14/40]
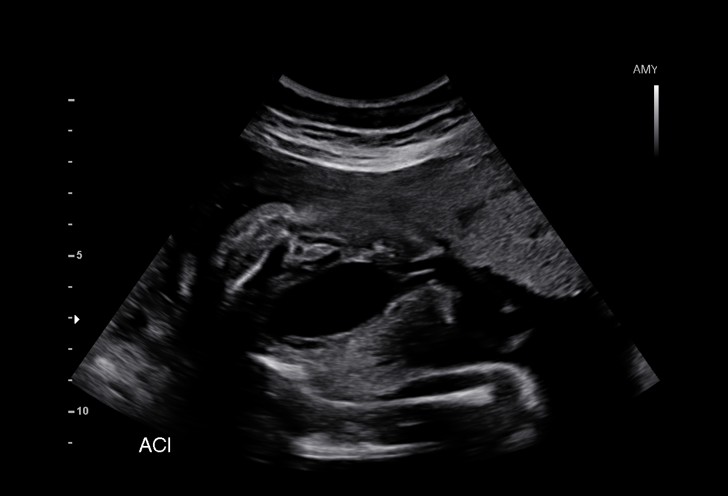
[im 16/40]
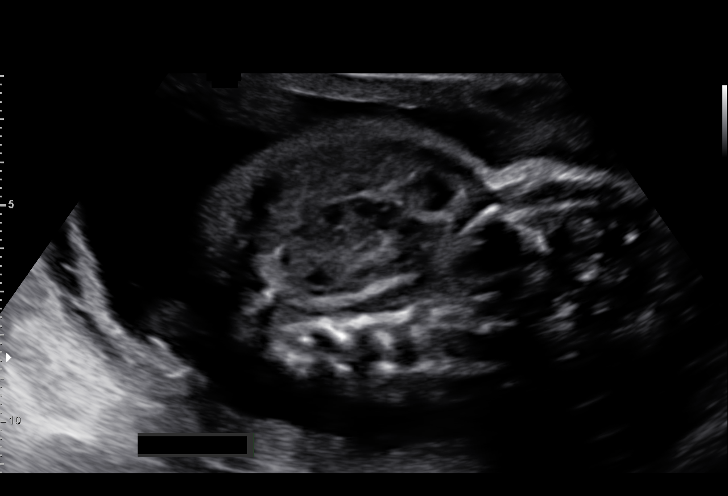
[im 19/40]
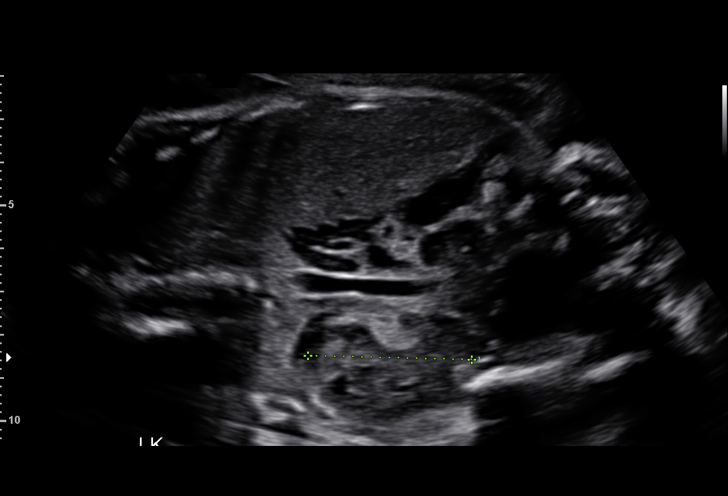
[im 22/40]
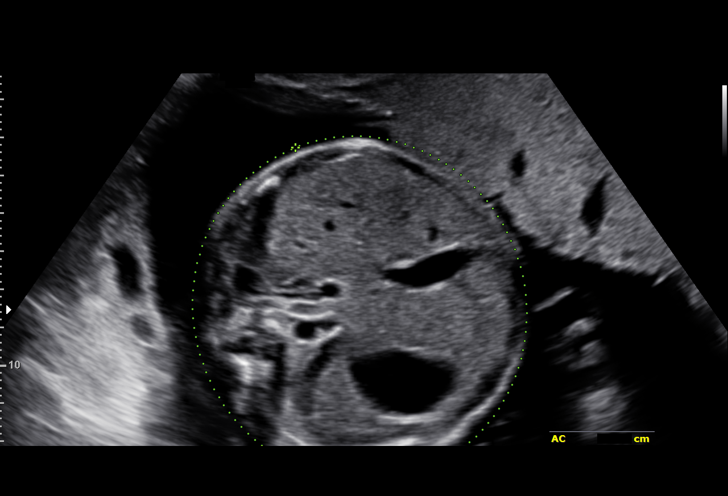
[im 25/40]
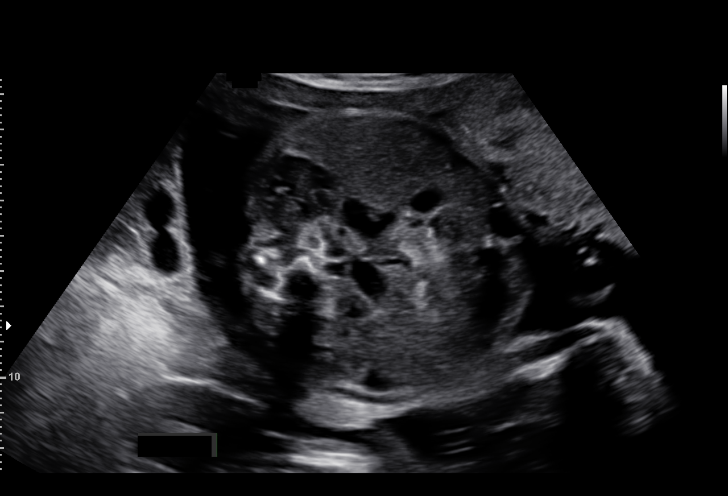
[im 28/40]
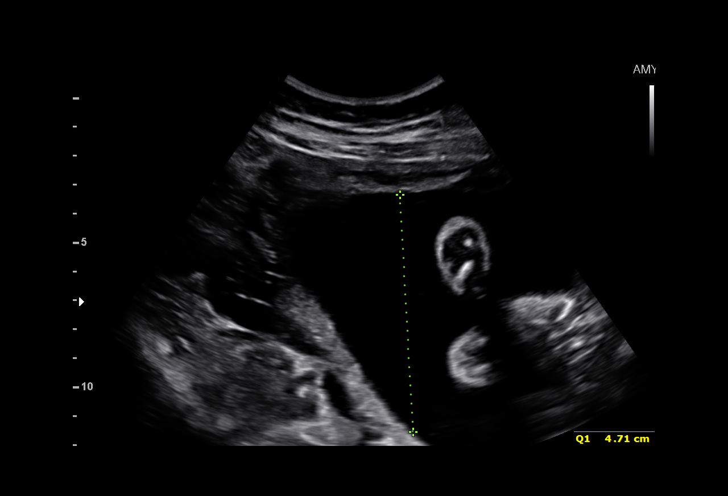
[im 31/40]
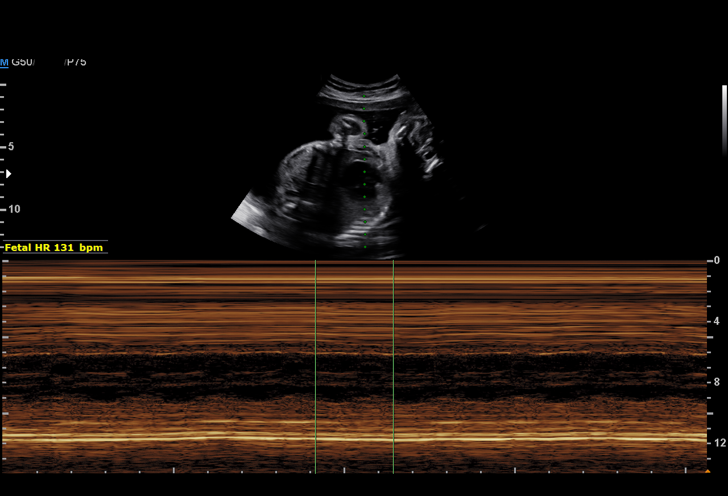
[im 34/40]
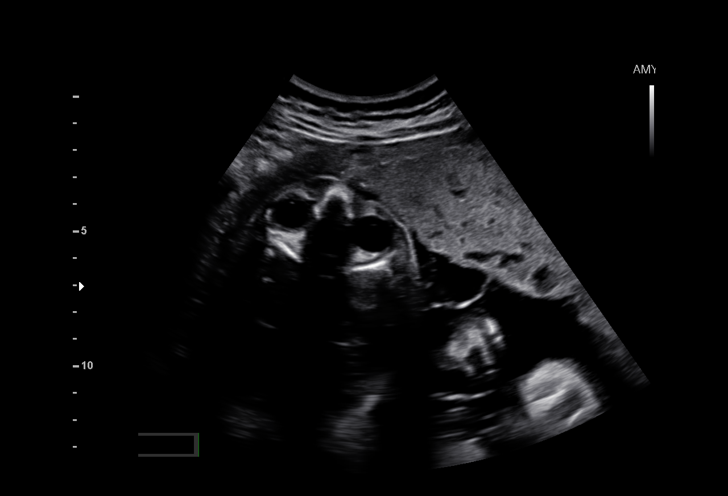
[im 37/40]
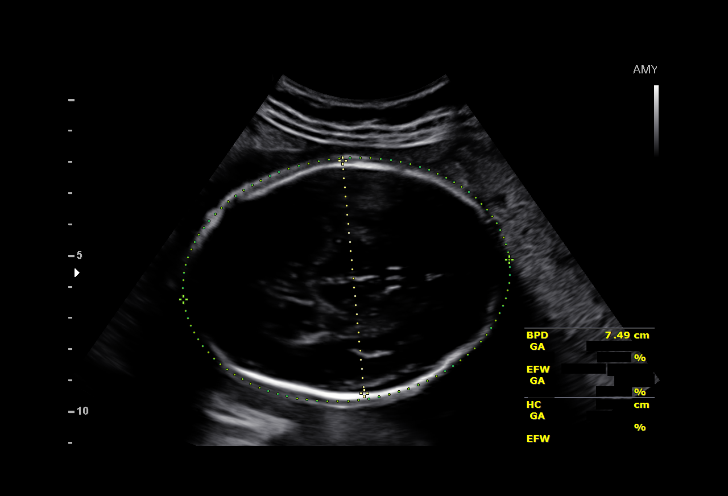
[im 40/40]
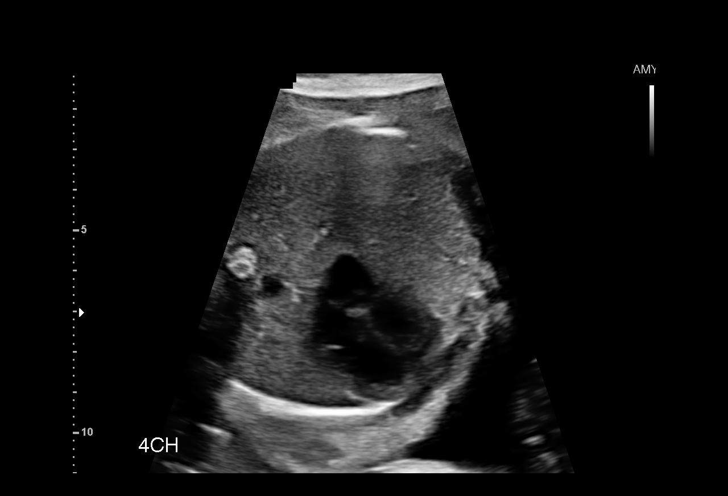

[14 of 28 positions shown; findings below may reference images not displayed]

[REDACTED]

Indications

Hypertension - Chronic/Pre-existing (d/c'd
Lisinopril 06/18/15)
History of cesarean delivery, currently
pregnant
30 weeks gestation of pregnancy
Advanced maternal age multigravida 35+,
third trimester (44), declined all
screening/testing
OB History

Gravidity:    6         Term:   4         SAB:   1
Living:       4
Fetal Evaluation

Num Of Fetuses:     1
Fetal Heart         131
Rate(bpm):
Cardiac Activity:   Observed
Presentation:       Breech
Placenta:           Anterior, above cervical os
P. Cord Insertion:  Previously Visualized

Amniotic Fluid
AFI FV:      Subjectively within normal limits
AFI Sum:     22.82   cm      91   %Tile     Larg Pckt:   8.21   cm
RUQ:   4.71   cm    RLQ:    2.12   cm    LUQ:   8.21    cm   LLQ:    7.78   cm
Biometry
BPD:      74.7  mm     G. Age:  30w 0d                  CI:        67.47   %   70 - 86
FL/HC:      19.5   %   19.3 -
HC:      291.1  mm     G. Age:  32w 1d         52  %    HC/AC:      1.04       0.96 -
AC:      280.7  mm     G. Age:  32w 0d         83  %    FL/BPD:     76.0   %   71 - 87
FL:       56.8  mm     G. Age:  29w 6d         15  %    FL/AC:      20.2   %   20 - 24
HUM:      50.4  mm     G. Age:  29w 4d         27  %

Est. FW:    5825  gm    3 lb 13 oz      64  %
Gestational Age

LMP:           29w 1d       Date:   03/01/15                 EDD:   12/06/15
U/S Today:     31w 0d                                        EDD:   11/23/15
Best:          30w 5d    Det. By:   U/S  (06/26/15)          EDD:   11/25/15
Anatomy

Cranium:          Previously seen        LVOT:             Previously seen
Fetal Cavum:      Previously seen        Aortic Arch:      Previously seen
Ventricles:       Appears normal         Ductal Arch:      Previously seen
Choroid Plexus:   Previously seen        Diaphragm:        Appears normal
Cerebellum:       Previously seen        Stomach:          Appears normal, left
sided
Posterior Fossa:  Previously seen        Abdomen:          Appears normal
Nuchal Fold:      Previously seen        Abdominal Wall:   Appears nml (cord
insert, abd wall)
Face:             Orbits and profile     Cord Vessels:     Appears normal (3
previously seen                          vessel cord)
Lips:             Previously seen        Kidneys:          Appear normal
Palate:           Previously seen        Bladder:          Appears normal
Fetal Thoracic:   Previously seen        Spine:            Previously seen
Heart:            Appears normal         Upper             Previously seen
(4CH, axis, and        Extremities:
situs)
RVOT:             Previously seen        Lower             Previously seen
Extremities:

Other:  Heels and 5th digit previously seen. Male gender.
Cervix Uterus Adnexa

Cervix
Length:           3.74  cm.
Normal appearance by transabdominal scan. Appears closed, without
funnelling.
Impression

SIUP at 30+5 weeks
Normal interval anatomy; anatomic survey complete
Normal amniotic fluid volume
Appropriate interval growth with EFW at the 64th %tile
Recommendations

Follow-up ultrasound for growth in 4 weeks

## 2016-08-05 ENCOUNTER — Other Ambulatory Visit: Payer: Self-pay | Admitting: Obstetrics and Gynecology

## 2016-08-05 DIAGNOSIS — Z1231 Encounter for screening mammogram for malignant neoplasm of breast: Secondary | ICD-10-CM

## 2016-08-18 ENCOUNTER — Encounter (HOSPITAL_COMMUNITY): Payer: Self-pay

## 2016-08-18 ENCOUNTER — Ambulatory Visit
Admission: RE | Admit: 2016-08-18 | Discharge: 2016-08-18 | Disposition: A | Discharge status: Home / Self Care | Payer: No Typology Code available for payment source | Source: Ambulatory Visit | Attending: Obstetrics and Gynecology | Admitting: Obstetrics and Gynecology

## 2016-08-18 ENCOUNTER — Ambulatory Visit (HOSPITAL_COMMUNITY)
Admission: RE | Admit: 2016-08-18 | Discharge: 2016-08-18 | Disposition: A | Discharge status: Home / Self Care | Payer: Self-pay | Source: Ambulatory Visit | Attending: Obstetrics and Gynecology | Admitting: Obstetrics and Gynecology

## 2016-08-18 VITALS — BP 118/76 | Temp 98.7°F | Ht 61.5 in | Wt 153.2 lb

## 2016-08-18 DIAGNOSIS — Z1231 Encounter for screening mammogram for malignant neoplasm of breast: Secondary | ICD-10-CM

## 2016-08-18 DIAGNOSIS — Z1239 Encounter for other screening for malignant neoplasm of breast: Secondary | ICD-10-CM

## 2016-08-18 NOTE — Patient Instructions (Signed)
Explained breast self awareness with [name]. Patient did not need a Pap smear today due to last Pap smear was in September 2017 per patient. Let her know [facility] will cover Pap smears every 3 years unless has a history of abnormal Pap smears. [facility]. Appointment scheduled for Thursday, August 18, 2016 at 1140. Let patient know the Breast center will follow up with her within the next couple weeks with results of mammogram by letter or phone. Atilano MedianMaria P Angelina PihValadez Kiddy verbalized understanding.  [place], Kathaleen Maserhristine Poll, RN 3:49 PM

## 2016-08-18 NOTE — Progress Notes (Signed)
No complaints today.   Pap Smear:  Pap smear not completed today. [facility] and normal per patient. Per patient has no history of an abnormal Pap smear. No Pap smear results are in EPIC.  Physical exam: Breasts Breasts symmetrical. Two scars observed left breast at 9 o'clock from history of breast infection per patient. Two scars observed right breast at 12 o'clock and 2 o'clock from history of breast infection per patient. No nipple retraction bilateral breasts. No nipple discharge bilateral breasts. No lymphadenopathy. No lumps palpated bilateral breasts. No complaints of pain or tenderness on exam. [facility]. Appointment scheduled for Thursday, August 18, 2016 at 1140.        Pelvic/Bimanual No Pap smear completed today since last Pap smear was in September 2017 per patient. Pap smear not indicated per [facility] guidelines.   Smoking History: Patient has never smoked.  Patient Navigation: Patient education provided. Access to services provided for patient through [facility]. Spanish interpreter provided.  Used Spanish interpreter [employer]rika McReynolds from [employer].

## 2016-08-19 ENCOUNTER — Encounter (HOSPITAL_COMMUNITY): Payer: Self-pay | Admitting: *Deleted

## 2016-09-05 ENCOUNTER — Ambulatory Visit (INDEPENDENT_AMBULATORY_CARE_PROVIDER_SITE_OTHER): Payer: Self-pay | Admitting: Family Medicine

## 2016-09-05 VITALS — BP 120/86 | HR 71 | Temp 98.3°F | Resp 16 | Ht 60.75 in | Wt 149.8 lb

## 2016-09-05 DIAGNOSIS — I10 Essential (primary) hypertension: Secondary | ICD-10-CM

## 2016-09-05 DIAGNOSIS — Z13220 Encounter for screening for lipoid disorders: Secondary | ICD-10-CM

## 2016-09-05 MED ORDER — LISINOPRIL-HYDROCHLOROTHIAZIDE 10-12.5 MG PO TABS: 1.0000 | ORAL_TABLET | Freq: Every day | ORAL | 1 refills | Status: DC

## 2016-09-05 NOTE — Patient Instructions (Addendum)
  Una pastilla cada dia for presion.  obtenecer una cita por fisico.   IF you received an x-ray today, you will receive an invoice from [facility]. Please contact [facility] at [phone] with questions or concerns regarding your invoice.   IF you received labwork today, you will receive an invoice from [facility]. Please contact [facility] at [phone] with questions or concerns regarding your invoice.   Our billing staff will not be able to assist you with questions regarding bills from these companies.  You will be contacted with the lab results as soon as they are available. The fastest way to get your results is to activate your My Chart account. Instructions are located on the last page of this paperwork. If you have not heard from us regarding the results in 2 weeks, please contact this office.

## 2016-09-05 NOTE — Progress Notes (Signed)
By signing my name below, I, Mesha Guinyard, attest that this documentation has been prepared under the direction and in the presence of Meredith StaggersJeffrey Stephinie Battisti, MD.  Electronically Signed: Arvilla MarketMesha Guinyard, Medical Scribe. 09/05/16. 8:29 AM.  Subjective:    Patient ID: Christy Hernandez, female    DOB: February 01, 1971, 46 y.o.   MRN: [id number]  HPI Chief Complaint  Patient presents with  . new pt    med refill for Lisinopril-HCTZ 10-12.5 mg    HPI Comments: Christy MedianMaria P Angelina PihValadez Hernandez is a 46 y.o. female who presents to the [facility] for bp refill. Prev followed by OBGYN and bp 147/80 at post partum visit 01/06/16. She was restarted on her lisinopril-HCTZ at that time. Pt's native language is [place].  Pt is compliant with her medication and she ran out of her medication 2 days ago on Saturday. She occaionslly experiences neck pain but she doesn't experience other negative side effects from her medication such as abdominal pain, light-headedness, dizziness, HA, etc.  Health Maintenance: Pt would like to establish care here. Immunizations: Pt had her last flu shot in 2016 and she declined a flu shot today. Cervical CA Screening: Pt had a nl pap smear in Sept 2017  There are no active problems to display for this patient.  Past Medical History:  Diagnosis Date  . Anemia   . Hypertension   . No pertinent past medical history    Past Surgical History:  Procedure Laterality Date  . CESAREAN SECTION    . TUBAL LIGATION Bilateral 11/26/2015   Procedure: POST PARTUM TUBAL LIGATION;  Surgeon: Catalina AntiguaPeggy Constant, MD;  Location: [facility];  Service: Gynecology;  Laterality: Bilateral;   No Known Allergies Prior to Admission medications   Medication Sig Start Date End Date Taking? Authorizing Provider  ferrous sulfate 325 (65 FE) MG tablet Take 325 mg by mouth daily with breakfast.   Yes Historical Provider, MD  lisinopril-hydrochlorothiazide (PRINZIDE,ZESTORETIC) 10-12.5 MG tablet  Take 1 tablet by mouth daily. 01/06/16  Yes Adam PhenixJames G Arnold, MD  Prenatal Vit-Fe Fumarate-FA (PRENATAL VITAMINS PLUS) 27-1 MG TABS Take 1 tablet by mouth once. Patient taking differently: Take 1 tablet by mouth daily.  10/15/15  Yes Peggy Constant, MD  ibuprofen (ADVIL,MOTRIN) 600 MG tablet Take 1 tablet (600 mg total) by mouth every 6 (six) hours. Patient not taking: Reported on 08/18/2016 11/27/15   Campbell StallKaty Dodd Mayo, MD  lisinopril (PRINIVIL) 10 MG tablet Take 1 tablet (10 mg total) by mouth daily. Patient not taking: Reported on 08/18/2016 11/27/15   Federico FlakeKimberly Niles Newton, MD  oxyCODONE-acetaminophen (PERCOCET/ROXICET) 5-325 MG tablet Take 1-2 tablets by mouth every 6 (six) hours as needed. Patient not taking: Reported on 08/18/2016 11/27/15   Federico FlakeKimberly Niles Newton, MD   Social History   Social History  . Marital status: Married    Spouse name: N/A  . Number of children: N/A  . Years of education: N/A   Occupational History  . Not on file.   Social History Main Topics  . Smoking status: Never Smoker  . Smokeless tobacco: Never Used  . Alcohol use No  . Drug use: No  . Sexual activity: Yes    Birth control/ protection: Surgical   Other Topics Concern  . Not on file   Social History Narrative  . No narrative on file   Review of Systems  Constitutional: Negative for fatigue and unexpected weight change.  Respiratory: Negative for chest tightness and shortness of breath.  Cardiovascular: Negative for chest pain, palpitations and leg swelling.  Gastrointestinal: Negative for abdominal pain and blood in stool.  Musculoskeletal: Positive for neck pain (occasional).  Neurological: Negative for dizziness, syncope, light-headedness and headaches.   Objective:  Physical Exam  Constitutional: She is oriented to person, place, and time. She appears well-developed and well-nourished. No distress.  HENT:  Head: Normocephalic and atraumatic.  Eyes: Conjunctivae and EOM are normal. Pupils are  equal, round, and reactive to light.  Neck: Neck supple. Carotid bruit is not present.  Cardiovascular: Normal rate, regular rhythm, normal heart sounds and intact distal pulses.  Exam reveals no friction rub.   No murmur heard. Pulmonary/Chest: Effort normal and breath sounds normal. No respiratory distress. She has no wheezes. She has no rales.  Abdominal: Soft. She exhibits no pulsatile midline mass. There is no tenderness.  Musculoskeletal: She exhibits no edema (lower extremity).  Neurological: She is alert and oriented to person, place, and time.  Skin: Skin is warm and dry.  Psychiatric: She has a normal mood and affect. Her behavior is normal.  Nursing note and vitals reviewed.   Vitals:   09/05/16 0819 09/05/16 0821  BP: (!) 140/94 120/86  Pulse: 71   Resp: 16   Temp: 98.3 F (36.8 C)   TempSrc: Oral   SpO2: 100%   Weight: 149 lb 12.8 oz (67.9 kg)   Height: 5' 0.75" (1.543 m)   Body mass index is 28.54 kg/m.   Assessment & Plan:   Christy Hernandez is a 46 y.o. female Accelerated hypertension - Plan: lisinopril-hydrochlorothiazide (PRINZIDE,ZESTORETIC) 10-12.5 MG tablet, Comprehensive metabolic panel, Care order/instruction:  Screening for hyperlipidemia - Plan: Comprehensive metabolic panel, Lipid panel, Care order/instruction:  Off medicine for 2 days, likely reason for borderline elevated reading. Tolerated medication fine prior.  - Restart lisinopril/HCTZ at previous dose of 10/12.5 mg daily, #90 with one refill, check CMP and lipid panel for cholesterol screening.  Health maintenance  declined flu vaccine. Plan on follow-up physical within the next 6 months.  Meds ordered this encounter  Medications  . lisinopril-hydrochlorothiazide (PRINZIDE,ZESTORETIC) 10-12.5 MG tablet    Sig: Take 1 tablet by mouth daily.    Dispense:  90 tablet    Refill:  1   Patient Instructions    Una pastilla cada dia for presion.  obtenecer una cita por fisico.   IF  you received an x-ray today, you will receive an invoice from [facility]. Please contact [facility] at [phone] with questions or concerns regarding your invoice.   IF you received labwork today, you will receive an invoice from [place]. Please contact [facility] at [phone] with questions or concerns regarding your invoice.   Our billing staff will not be able to assist you with questions regarding bills from these companies.  You will be contacted with the lab results as soon as they are available. The fastest way to get your results is to activate your My Chart account. Instructions are located on the last page of this paperwork. If you have not heard from [place] regarding the results in 2 weeks, please contact this office.      I personally performed the services described in this documentation, which was scribed in my presence. The recorded information has been reviewed and considered for accuracy and completeness, addended by me as needed, and agree with information above.  Signed,   Meredith Staggers, MD Primary Care at [facility].  09/05/16 8:45 AM

## 2016-09-06 LAB — COMPREHENSIVE METABOLIC PANEL
ALK PHOS: 74 IU/L (ref 39–117)
ALT: 59 IU/L — ABNORMAL HIGH (ref 0–32)
AST: 31 IU/L (ref 0–40)
Albumin/Globulin Ratio: 1.6 (ref 1.2–2.2)
Albumin: 4.1 g/dL (ref 3.5–5.5)
BUN/Creatinine Ratio: 21 (ref 9–23)
BUN: 11 mg/dL (ref 6–24)
Bilirubin Total: 0.3 mg/dL (ref 0.0–1.2)
CALCIUM: 8.7 mg/dL (ref 8.7–10.2)
CO2: 22 mmol/L (ref 18–29)
CREATININE: 0.52 mg/dL — AB (ref 0.57–1.00)
Chloride: 103 mmol/L (ref 96–106)
GFR calc Af Amer: 133 mL/min/{1.73_m2} (ref >59)
GFR, EST NON AFRICAN AMERICAN: 115 mL/min/{1.73_m2} (ref >59)
GLOBULIN, TOTAL: 2.5 g/dL (ref 1.5–4.5)
Glucose: 82 mg/dL (ref 65–99)
POTASSIUM: 3.9 mmol/L (ref 3.5–5.2)
SODIUM: 140 mmol/L (ref 134–144)
Total Protein: 6.6 g/dL (ref 6.0–8.5)

## 2016-09-06 LAB — LIPID PANEL
CHOLESTEROL TOTAL: 176 mg/dL (ref 100–199)
Chol/HDL Ratio: 5.7 ratio units — ABNORMAL HIGH (ref 0.0–4.4)
HDL: 31 mg/dL — ABNORMAL LOW (ref >39)
LDL Calculated: 99 mg/dL (ref 0–99)
TRIGLYCERIDES: 229 mg/dL — AB (ref 0–149)
VLDL Cholesterol Cal: 46 mg/dL — ABNORMAL HIGH (ref 5–40)

## 2016-09-22 IMAGING — US US MFM OB FOLLOW-UP
1 series · 14 of 23 positions shown · non-contrast
Comparison: none

[Series 1: us mfm ob follow-up · 23 acquisitions, 14 frames shown]
[im 1/23]
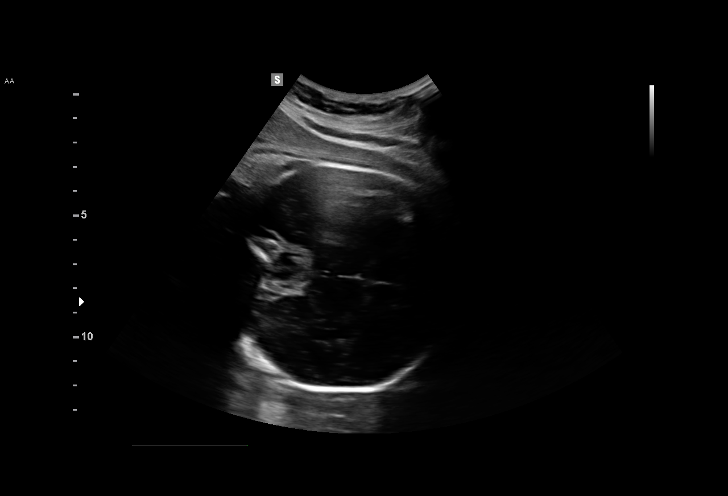
[im 3/23]
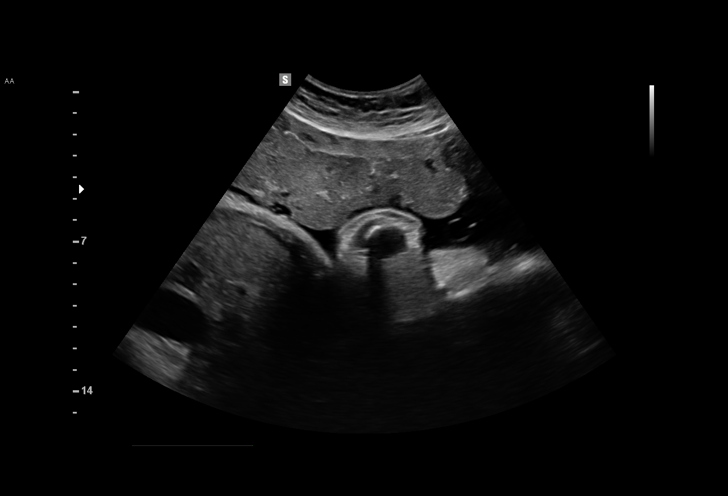
[im 5/23]
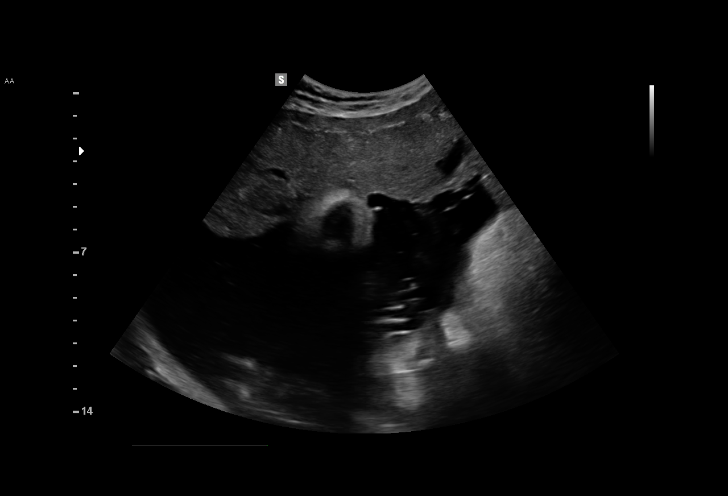
[im 6/23]
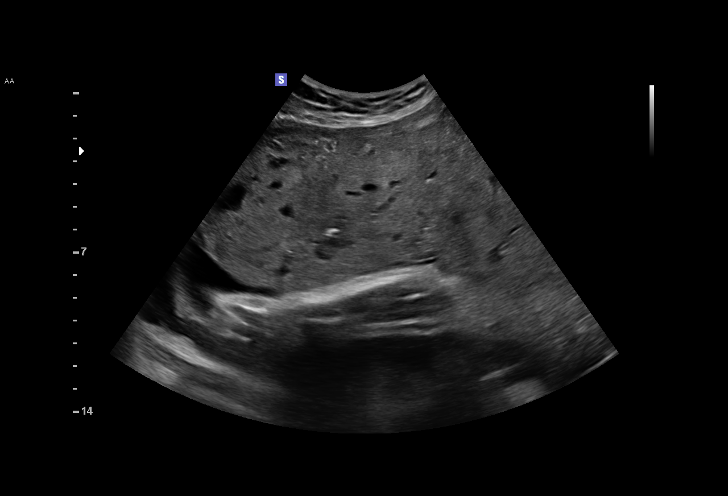
[im 8/23]
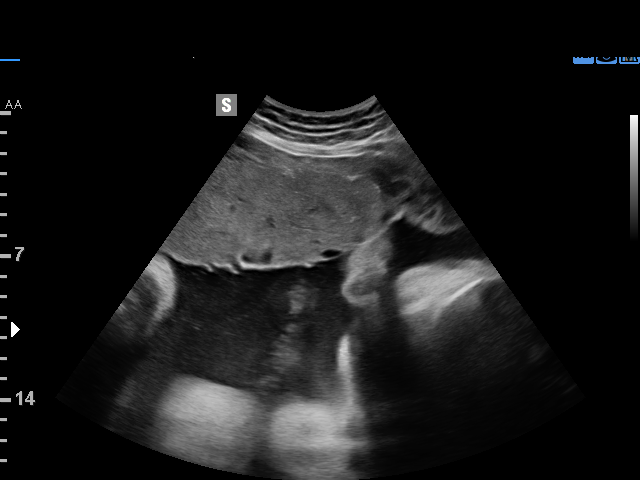
[im 10/23]
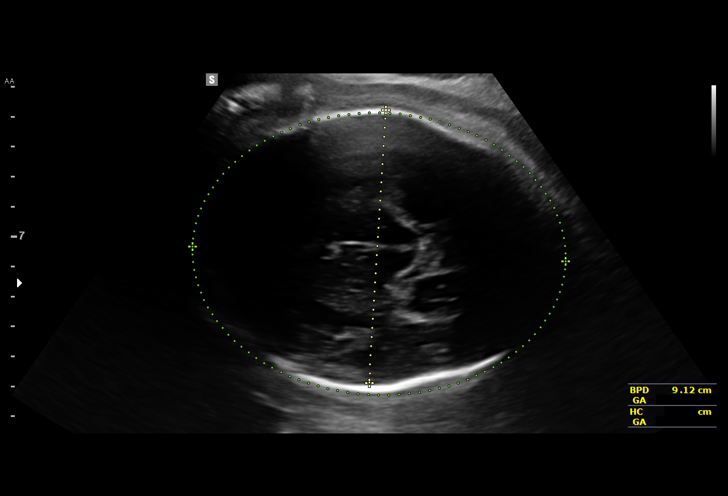
[im 11/23]
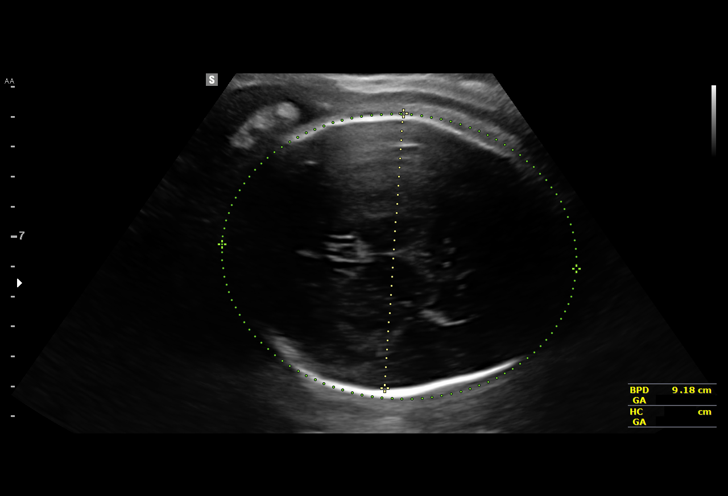
[im 13/23]
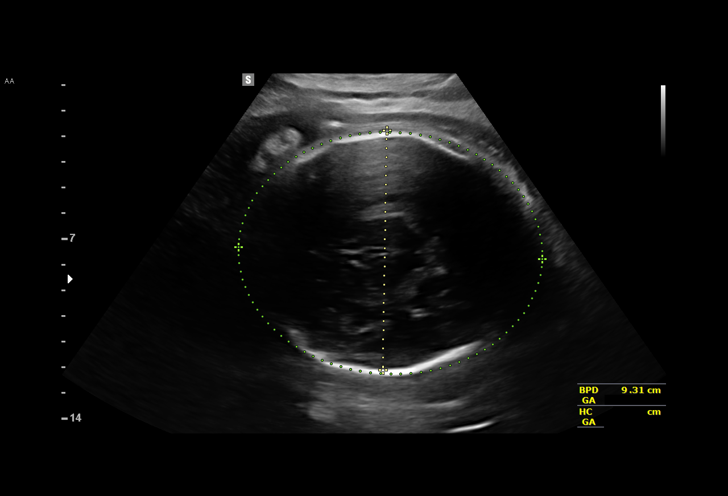
[im 14/23]
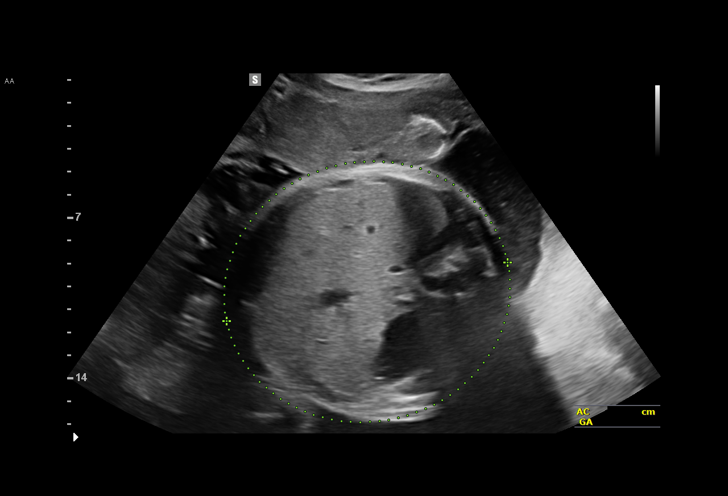
[im 16/23]
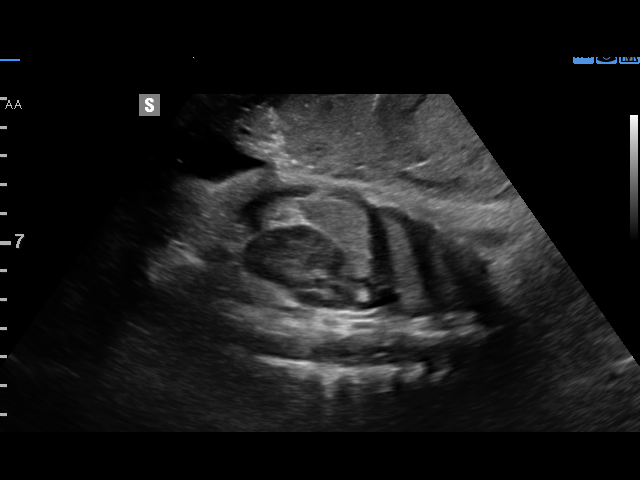
[im 18/23]
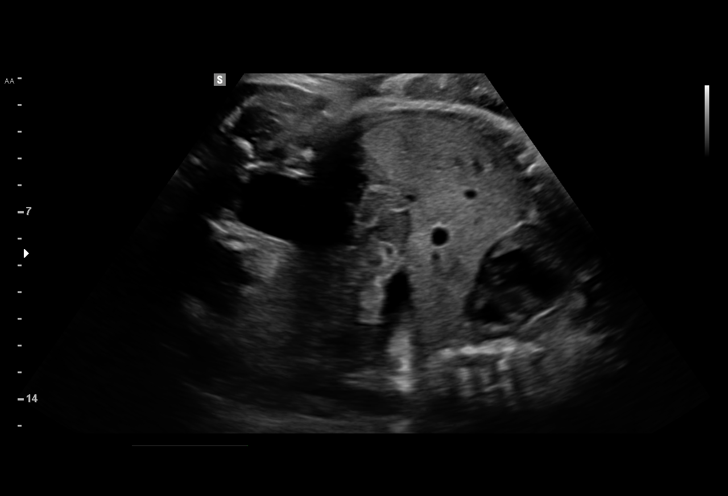
[im 19/23]
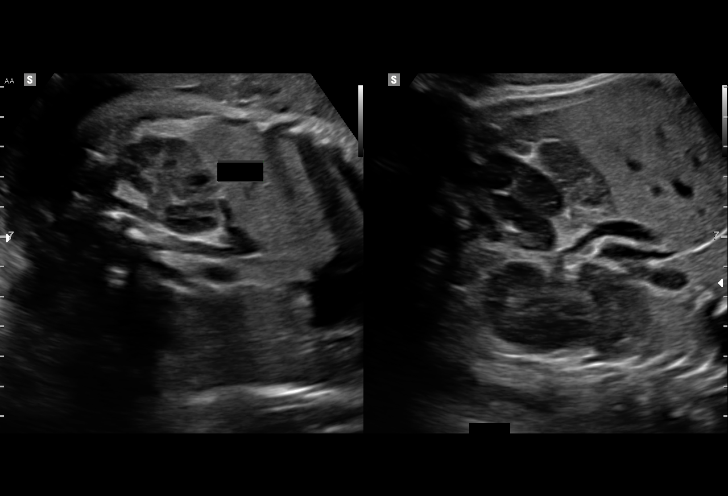
[im 21/23]
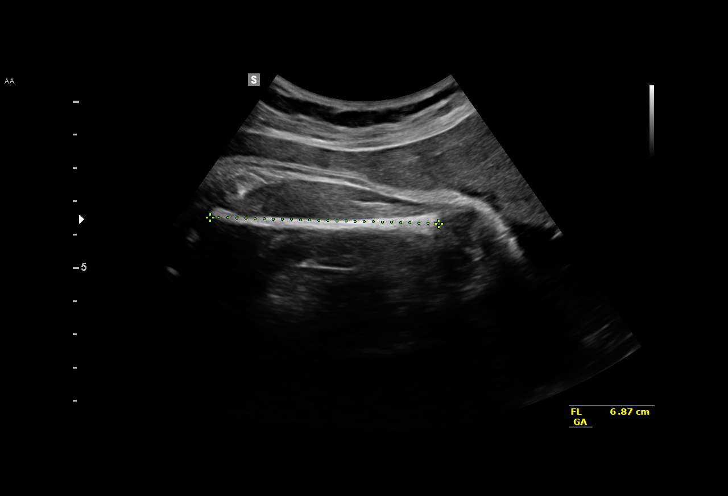
[im 23/23]
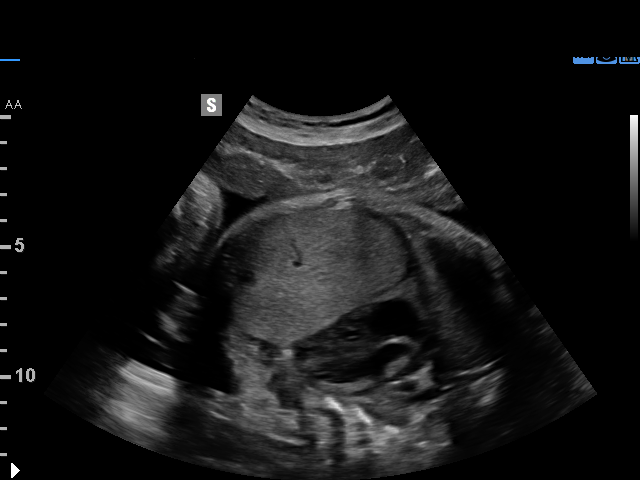

[14 of 23 positions shown; findings below may reference images not displayed]

[REDACTED]

Indications

38 weeks gestation of pregnancy
Hypertension - Chronic/Pre-existing (d/c'd
Lisinopril 06/18/15)
History of cesarean delivery, currently
pregnant
Advanced maternal age multigravida 35+,
third trimester (44), declined all
screening/testing
OB History

Gravidity:    6         Term:   4         SAB:   1
Living:       4
Fetal Evaluation

Num Of Fetuses:     1
Fetal Heart         132
Rate(bpm):
Cardiac Activity:   Observed
Presentation:       Cephalic
Placenta:           Anterior, above cervical os

Amniotic Fluid
AFI FV:      Subjectively, upper limits of normal

AFI Sum(cm)     %Tile       Largest Pocket(cm)
23.28           95

RUQ(cm)       RLQ(cm)       LUQ(cm)        LLQ(cm)
8.67
Biometry

BPD:      92.5  mm     G. Age:  37w 4d         50  %    CI:        76.16   %   70 - 86
FL/HC:      20.5   %   20.6 -
HC:      335.9  mm     G. Age:  38w 3d         30  %    HC/AC:      0.90       0.87 -
AC:      372.3  mm     G. Age:  41w 1d       > 97  %    FL/BPD:     74.4   %   71 - 87
FL:       68.8  mm     G. Age:  35w 2d        < 3  %    FL/AC:      18.5   %   20 - 24

Est. FW:    3433  gm      8 lb 4 oz   > 90  %
Gestational Age

LMP:           37w 1d       Date:   03/01/15                 EDD:   12/06/15
U/S Today:     38w 1d                                        EDD:   11/29/15
Best:          38w 5d    Det. By:   U/S  (06/26/15)          EDD:   11/25/15
Anatomy

Cranium:               Appears normal         LVOT:                   Previously seen
Cavum:                 Previously seen        Aortic Arch:            Previously seen
Ventricles:            Appears normal         Ductal Arch:            Previously seen
Choroid Plexus:        Previously seen        Diaphragm:              Previously seen
Cerebellum:            Previously seen        Stomach:                Appears normal, left
sided
Posterior Fossa:       Previously seen        Abdomen:                Previously seen
Nuchal Fold:           Previously seen        Abdominal Wall:         Previously seen
Face:                  Orbits and profile     Cord Vessels:           Previously seen
previously seen
Lips:                  Previously seen        Kidneys:                Appear normal
Palate:                Previously seen        Bladder:                Appears normal
Thoracic:              Previously seen        Spine:                  Previously seen
Heart:                 Previously seen        Upper Extremities:      Previously seen
RVOT:                  Previously seen        Lower Extremities:      Previously seen

Other:  Heels and 5th digit previously seen. Male gender.
Cervix Uterus Adnexa

Cervix
Not visualized (advanced GA >26wks)
Impression

SIUP at 38+5 weeks
Normal interval anatomy; anatomic survey complete
Normal amniotic fluid volume
EFW > 90th %tile; AC > 97th %tile; 3433; 8+4; fetus at risk to
be LGA
Recommendations

Continue twice weekly NSTs with weekly AFIs

## 2017-03-09 ENCOUNTER — Ambulatory Visit (INDEPENDENT_AMBULATORY_CARE_PROVIDER_SITE_OTHER): Payer: Self-pay | Admitting: Family Medicine

## 2017-03-09 ENCOUNTER — Encounter: Payer: Self-pay | Admitting: Family Medicine

## 2017-03-09 VITALS — BP 129/76 | HR 77 | Temp 98.2°F | Resp 16 | Ht 61.0 in | Wt 145.0 lb

## 2017-03-09 DIAGNOSIS — R945 Abnormal results of liver function studies: Secondary | ICD-10-CM

## 2017-03-09 DIAGNOSIS — M542 Cervicalgia: Secondary | ICD-10-CM

## 2017-03-09 DIAGNOSIS — E785 Hyperlipidemia, unspecified: Secondary | ICD-10-CM

## 2017-03-09 DIAGNOSIS — R7989 Other specified abnormal findings of blood chemistry: Secondary | ICD-10-CM

## 2017-03-09 DIAGNOSIS — D649 Anemia, unspecified: Secondary | ICD-10-CM

## 2017-03-09 DIAGNOSIS — I10 Essential (primary) hypertension: Secondary | ICD-10-CM

## 2017-03-09 MED ORDER — LISINOPRIL 10 MG PO TABS: 10.0000 mg | ORAL_TABLET | Freq: Every day | ORAL | 1 refills | Status: DC

## 2017-03-09 NOTE — Progress Notes (Signed)
Subjective:  By signing my name below, I, Stann Ore, attest that this documentation has been prepared under the direction and in the presence of Meredith Staggers, MD. Electronically Signed: Stann Ore, Scribe. 03/09/2017 , 8:53 AM .  Patient was seen in Room 26 .   Patient ID: Christy Hernandez, female    DOB: [birth year], 46 y.o.   MRN: [id number] Chief Complaint  Patient presents with  . Medication Refill    lisinopril  . Lab Work    iron check   HPI Christy Hernandez is a 46 y.o. female  She was last seen on Feb 19th for HTN. She drank orange juice at around 8:00AM today.   Neck pain She mentions neck pain being present constantly.   She works as a Architectural technologist. She is not currently breast feeding.   Lab Work - iron levels She requests having her iron checked, as she has a history of anemia. She stopped taking her iron supplement in June, no reason for stopping.   HTN At that time, she was taking Lisinopril-HCTZ but ran out 2 days prior. Her BP was borderline initially at 140/94, but rechecked at 120/86. At that visit, restarted her Lisinopril-HCTZ 10-12.5mg  QD.   She doesn't check her BP at home. She denies feeling dizzy when taking her medication. She's been out of her medication for a week currently. She denies chest pain, shortness of breath, headache, dizziness, nausea, vomiting, or abdominal pain.   Hyperlipidemia Lab Results  Component Value Date   CHOL 176 09/05/2016   HDL 31 (L) 09/05/2016   LDLCALC 99 09/05/2016   TRIG 229 (H) 09/05/2016   CHOLHDL 5.7 (H) 09/05/2016   She was recommended recheck in 2-3 months.   Elevated LFT Lab Results  Component Value Date   ALT 59 (H) 09/05/2016   AST 31 09/05/2016   ALKPHOS 74 09/05/2016   BILITOT 0.3 09/05/2016   Her ALT increased from 15, 1 year prior. She denies drinking alcohol.   There are no active problems to display for this patient.  Past Medical History:  Diagnosis Date  .  Anemia   . Hypertension   . No pertinent past medical history    Past Surgical History:  Procedure Laterality Date  . CESAREAN SECTION    . TUBAL LIGATION Bilateral 11/26/2015   Procedure: POST PARTUM TUBAL LIGATION;  Surgeon: Catalina Antigua, MD;  Location: [facility];  Service: Gynecology;  Laterality: Bilateral;   No Known Allergies Prior to Admission medications   Medication Sig Start Date End Date Taking? Authorizing Provider  ferrous sulfate 325 (65 FE) MG tablet Take 325 mg by mouth daily with breakfast.    [provider]  lisinopril-hydrochlorothiazide (PRINZIDE,ZESTORETIC) 10-12.5 MG tablet Take 1 tablet by mouth daily. 09/05/16   Shade Flood, MD   Social History   Social History  . Marital status: Married    Spouse name: N/A  . Number of children: N/A  . Years of education: N/A   Occupational History  . Not on file.   Social History Main Topics  . Smoking status: Never Smoker  . Smokeless tobacco: Never Used  . Alcohol use No  . Drug use: No  . Sexual activity: Yes    Birth control/ protection: Surgical   Other Topics Concern  . Not on file   Social History Narrative  . No narrative on file   Review of Systems  Constitutional: Negative for fatigue and unexpected weight change.  Respiratory: Negative for chest tightness and shortness of breath.   Cardiovascular: Negative for chest pain, palpitations and leg swelling.  Gastrointestinal: Negative for abdominal pain and blood in stool.  Musculoskeletal: Positive for neck pain.  Neurological: Negative for dizziness, syncope, light-headedness and headaches.      Objective:   Physical Exam  Constitutional: She is oriented to person, place, and time. She appears well-developed and well-nourished.  HENT:  Head: Normocephalic and atraumatic.  Eyes: Pupils are equal, round, and reactive to light. Conjunctivae and EOM are normal.  Neck: Carotid bruit is not present.  Cardiovascular: Normal rate, regular  rhythm, normal heart sounds and intact distal pulses.   Pulmonary/Chest: Effort normal and breath sounds normal.  Abdominal: Soft. She exhibits no pulsatile midline mass. There is no tenderness.  Musculoskeletal:  C-spine: no midline tenderness, has some spasms over left paraspinal muslces  Neurological: She is alert and oriented to person, place, and time.  Reflex Scores:      Tricep reflexes are 2+ on the right side and 2+ on the left side.      Bicep reflexes are 2+ on the right side and 2+ on the left side.      Brachioradialis reflexes are 2+ on the right side and 2+ on the left side. Skin: Skin is warm and dry.  Psychiatric: She has a normal mood and affect. Her behavior is normal.  Vitals reviewed.  Vitals:   03/09/17 0836  BP: 129/76  Pulse: 77  Resp: 16  Temp: 98.2 F (36.8 C)  TempSrc: Oral  SpO2: 100%  Weight: 145 lb (65.8 kg)  Height: 5\' 1"  (1.549 m)      Assessment & Plan:    Christy Hernandez is a 46 y.o. female Essential hypertension - Plan: lisinopril (PRINIVIL,ZESTRIL) 10 MG tablet, Comprehensive metabolic panel  - Off medications for 1 week and blood pressure appears stable here in the office. Will decrease her medication to just lisinopril 10 mg daily, and monitor outside readings to determine if further changes needed. Check CMP  Anemia, unspecified type - Plan: CBC  - Off supplements recently, check CBC to determine if restart needed. Iron levels pending.   Hyperlipidemia, unspecified hyperlipidemia type - Plan: Comprehensive metabolic panel, Lipid panel  - Not fasting today. Plan for fasting lab visit in 2 days then determine if medication needed. Diet and exercise were discussed for now.  Elevated liver function tests - Plan: Comprehensive metabolic panel  - Repeat CMP this Saturday.  Neck pain on left side  - No midline bony tenderness, no red flags on exam or history. Likely muscle spasm. Range of motion, heat, ice, then Tylenol if needed as  long as LFTs are normal.  Meds ordered this encounter  Medications  . lisinopril (PRINIVIL,ZESTRIL) 10 MG tablet    Sig: Take 1 tablet (10 mg total) by mouth daily.    Dispense:  90 tablet    Refill:  1   Patient Instructions   Return Saturday for labs. Make sure you're fasting for at least 8 hours.   Change blood pressure medicine to just lisinopril for now. Measure your blood pressure outside of the office and if numbers are over 140/90, return to discuss further changes. Recheck blood pressure in the next 6 months.  I will check blood counts  Saturday to determine if you need to restart iron.  Neck pain appears to be in the muscle. This can be from some spasm or tightness, as well as sometimes  arthritis in the neck. If your liver tests are better on testing Saturday, can use Tylenol over-the-counter as needed. For now heat or ice and gentle range of motion or stretches. Return if that is not improving in the next 4-6 weeks. Sooner if worse    Water engineer (Musculoskeletal Pain) El dolor musculoesqueltico comprende dolores y Associate Professor en los msculos y en los [place]. Este dolor puede ocurrir en cualquier parte del cuerpo. INSTRUCCIONES PARA EL CUIDADO EN EL HOGAR  Solo tome medicamentos para calmar el dolor, el malestar o bajar la fiebre, segn las indicaciones del mdico.  Podr seguir con todas las actividades a menos que stas le ocasionen ms [employer] Cuando el dolor disminuya, retome las actividades habituales lentamente. Aumente gradualmente la intensidad y la duracin de sus actividades o del ejercicio.  Durante los perodos de dolor intenso, el reposo en cama puede ser beneficioso. Acustese o sintese en cualquier posicin que sea cmoda, pero salga de la cama y camine al menos cada varias horas.  Si se lo indican, aplique hielo sobre la zona de la lesin. ? Ponga el hielo en una bolsa plstica. ? Coloque una [employer] piel y la bolsa de hielo. ? Coloque  el hielo durante , 2 a 3veces por da.  SOLICITE ATENCIN MDICA SI:  El dolor empeora.  El dolor no se alivia con los [employer].  Pierde la funcionalidad en la zona del dolor si este se manifiesta en los brazos, las piernas o el cuello.  Esta informacin no tiene Theme park manager el consejo del mdico. Asegrese de hacerle al mdico cualquier pregunta que tenga. Document Released: 04/13/2005 Document Revised: 09/26/2011 Document Reviewed: 03/08/2013 Elsevier Interactive Patient Education  2017 [employer].   IF you received an x-ray today, you will receive an invoice from [facility]. Please contact [facility] at [phone] with questions or concerns regarding your invoice.   IF you received labwork today, you will receive an invoice from [place]. Please contact [facility] at [phone] with questions or concerns regarding your invoice.   Our billing staff will not be able to assist you with questions regarding bills from these companies.  You will be contacted with the lab results as soon as they are available. The fastest way to get your results is to activate your My Chart account. Instructions are located on the last page of this paperwork. If you have not heard from [place] regarding the results in 2 weeks, please contact this office.       I personally performed the services described in this documentation, which was scribed in my presence. The recorded information has been reviewed and considered for accuracy and completeness, addended by me as needed, and agree with information above.  Signed,   Meredith Staggers, MD Primary Care at [facility].  03/09/17 9:05 AM

## 2017-03-09 NOTE — Patient Instructions (Addendum)
Return Saturday for labs. Make sure you're fasting for at least 8 hours.   Change blood pressure medicine to just lisinopril for now. Measure your blood pressure outside of the office and if numbers are over 140/90, return to discuss further changes. Recheck blood pressure in the next 6 months.  I will check blood counts  Saturday to determine if you need to restart iron.  Neck pain appears to be in the muscle. This can be from some spasm or tightness, as well as sometimes arthritis in the neck. If your liver tests are better on testing Saturday, can use Tylenol over-the-counter as needed. For now heat or ice and gentle range of motion or stretches. Return if that is not improving in the next 4-6 weeks. Sooner if worse    Water engineer (Musculoskeletal Pain) El dolor musculoesqueltico comprende dolores y Associate Professor en los msculos y en los [place]. Este dolor puede ocurrir en cualquier parte del cuerpo. INSTRUCCIONES PARA EL CUIDADO EN EL HOGAR  Solo tome medicamentos para calmar el dolor, el malestar o bajar la fiebre, segn las indicaciones del mdico.  Podr seguir con todas las actividades a menos que stas le ocasionen ms [employer] Cuando el dolor disminuya, retome las actividades habituales lentamente. Aumente gradualmente la intensidad y la duracin de sus actividades o del ejercicio.  Durante los perodos de dolor intenso, el reposo en cama puede ser beneficioso. Acustese o sintese en cualquier posicin que sea cmoda, pero salga de la cama y camine al menos cada varias horas.  Si se lo indican, aplique hielo sobre la zona de la lesin. ? Ponga el hielo en una bolsa plstica. ? Coloque una [employer] piel y la bolsa de hielo. ? Coloque el hielo durante , 2 a 3veces por da.  SOLICITE ATENCIN MDICA SI:  El dolor empeora.  El dolor no se alivia con los [employer].  Pierde la funcionalidad en la zona del dolor si este se manifiesta en los brazos, las  piernas o el cuello.  Esta informacin no tiene Theme park manager el consejo del mdico. Asegrese de hacerle al mdico cualquier pregunta que tenga. Document Released: 04/13/2005 Document Revised: 09/26/2011 Document Reviewed: 03/08/2013 Elsevier Interactive Patient Education  2017 [employer].   IF you received an x-ray today, you will receive an invoice from [facility]. Please contact [facility] at [phone] with questions or concerns regarding your invoice.   IF you received labwork today, you will receive an invoice from [place]. Please contact [facility] at [phone] with questions or concerns regarding your invoice.   Our billing staff will not be able to assist you with questions regarding bills from these companies.  You will be contacted with the lab results as soon as they are available. The fastest way to get your results is to activate your My Chart account. Instructions are located on the last page of this paperwork. If you have not heard from [place] regarding the results in 2 weeks, please contact this office.

## 2017-03-10 ENCOUNTER — Ambulatory Visit: Payer: Self-pay | Admitting: Emergency Medicine

## 2017-03-10 DIAGNOSIS — E785 Hyperlipidemia, unspecified: Secondary | ICD-10-CM

## 2017-03-10 DIAGNOSIS — R7989 Other specified abnormal findings of blood chemistry: Secondary | ICD-10-CM

## 2017-03-10 DIAGNOSIS — I10 Essential (primary) hypertension: Secondary | ICD-10-CM

## 2017-03-10 DIAGNOSIS — D649 Anemia, unspecified: Secondary | ICD-10-CM

## 2017-03-10 DIAGNOSIS — R945 Abnormal results of liver function studies: Secondary | ICD-10-CM

## 2017-03-11 LAB — COMPREHENSIVE METABOLIC PANEL
ALT: 36 IU/L — AB (ref 0–32)
AST: 24 IU/L (ref 0–40)
Albumin/Globulin Ratio: 1.8 (ref 1.2–2.2)
Albumin: 4.2 g/dL (ref 3.5–5.5)
Alkaline Phosphatase: 71 IU/L (ref 39–117)
BUN/Creatinine Ratio: 13 (ref 9–23)
BUN: 7 mg/dL (ref 6–24)
Bilirubin Total: 0.3 mg/dL (ref 0.0–1.2)
CO2: 26 mmol/L (ref 20–29)
CREATININE: 0.53 mg/dL — AB (ref 0.57–1.00)
Calcium: 8.5 mg/dL — ABNORMAL LOW (ref 8.7–10.2)
Chloride: 106 mmol/L (ref 96–106)
GFR, EST AFRICAN AMERICAN: 131 mL/min/{1.73_m2} (ref >59)
GFR, EST NON AFRICAN AMERICAN: 113 mL/min/{1.73_m2} (ref >59)
GLUCOSE: 87 mg/dL (ref 65–99)
Globulin, Total: 2.3 g/dL (ref 1.5–4.5)
POTASSIUM: 4.3 mmol/L (ref 3.5–5.2)
Sodium: 142 mmol/L (ref 134–144)
TOTAL PROTEIN: 6.5 g/dL (ref 6.0–8.5)

## 2017-03-11 LAB — CBC
HEMOGLOBIN: 11 g/dL — AB (ref 11.1–15.9)
Hematocrit: 32.7 % — ABNORMAL LOW (ref 34.0–46.6)
MCH: 28.7 pg (ref 26.6–33.0)
MCHC: 33.6 g/dL (ref 31.5–35.7)
MCV: 85 fL (ref 79–97)
Platelets: 352 10*3/uL (ref 150–379)
RBC: 3.83 x10E6/uL (ref 3.77–5.28)
RDW: 14.6 % (ref 12.3–15.4)
WBC: 5.7 10*3/uL (ref 3.4–10.8)

## 2017-03-11 LAB — LIPID PANEL
CHOL/HDL RATIO: 4.9 ratio — AB (ref 0.0–4.4)
Cholesterol, Total: 170 mg/dL (ref 100–199)
HDL: 35 mg/dL — AB (ref >39)
LDL Calculated: 104 mg/dL — ABNORMAL HIGH (ref 0–99)
Triglycerides: 154 mg/dL — ABNORMAL HIGH (ref 0–149)
VLDL Cholesterol Cal: 31 mg/dL (ref 5–40)

## 2017-06-14 ENCOUNTER — Encounter (HOSPITAL_COMMUNITY): Payer: Self-pay

## 2017-07-18 HISTORY — PX: BREAST BIOPSY: SHX20

## 2017-09-07 ENCOUNTER — Ambulatory Visit (INDEPENDENT_AMBULATORY_CARE_PROVIDER_SITE_OTHER): Payer: Self-pay | Admitting: Family Medicine

## 2017-09-07 ENCOUNTER — Encounter: Payer: Self-pay | Admitting: Family Medicine

## 2017-09-07 VITALS — BP 118/75 | HR 70 | Temp 97.8°F | Resp 16 | Ht 61.0 in | Wt 141.4 lb

## 2017-09-07 DIAGNOSIS — E785 Hyperlipidemia, unspecified: Secondary | ICD-10-CM

## 2017-09-07 DIAGNOSIS — D649 Anemia, unspecified: Secondary | ICD-10-CM

## 2017-09-07 DIAGNOSIS — I10 Essential (primary) hypertension: Secondary | ICD-10-CM

## 2017-09-07 DIAGNOSIS — R945 Abnormal results of liver function studies: Secondary | ICD-10-CM

## 2017-09-07 DIAGNOSIS — R7989 Other specified abnormal findings of blood chemistry: Secondary | ICD-10-CM

## 2017-09-07 MED ORDER — LISINOPRIL 10 MG PO TABS: 10.0000 mg | ORAL_TABLET | Freq: Every day | ORAL | 1 refills | Status: DC

## 2017-09-07 NOTE — Progress Notes (Signed)
Subjective:  By signing my name below, I, Rosana Fret, attest that this documentation has been prepared under the direction and in the presence of Neva Seat Asencion Partridge, MD.  Electronically Signed: Rosana Fret, Medical Scribe 09/07/17 at 9:44 AM   Patient was seen in Room 10.   Patient ID: Christy Hernandez, female    DOB: 04/06/1971, 47 y.o.   MRN: [id number] Chief Complaint  Patient presents with  . Hypertension    6 month follow up    HPI Christy Hernandez is a 47 y.o. female who presents to Primary Care at [facility] today for a 6 month follow up for Hypertension. She was last seen in August 2018. BP was 129/76 at that visit. She was previously on combo lisinopril HCTZ but had been out for 1 week. Her regimen was adjusted to just Lisinopril 10 mg QD.   Today, she reports she is compliant with Lisinopril and she  denies any side effects including cough. Her BP is 118/75 today.    Hyperlipidemia  Lab Results  Component Value Date   CHOL 170 03/10/2017   HDL 35 (L) 03/10/2017   LDLCALC 104 (H) 03/10/2017   TRIG 154 (H) 03/10/2017   CHOLHDL 4.9 (H) 03/10/2017   Lab Results  Component Value Date   ALT 36 (H) 03/10/2017   AST 24 03/10/2017   ALKPHOS 71 03/10/2017   BILITOT 0.3 03/10/2017   Discussed diet changes and frequent exercise at last visit.   Wt Readings from Last 3 Encounters:  09/07/17 141 lb 6.4 oz (64.1 kg)  03/09/17 145 lb (65.8 kg)  09/05/16 149 lb 12.8 oz (67.9 kg)   She reports she is more active as she has been taking care of a toddler that is roaming around. She has not changed her diet.   Elevated LFT Mildly elevated ALT but had improved from pervious reading 1 year ago.   Neck Pain Discussed ROM activities, use of heat and Tylenol as needed at last visit. She reports unchanged neck pain since last visit. She has not tried anything.   Anemia She has a hx of anemia. She is not currently taking any supplements. She denies blood in the  stool, melena or any other complaints at this time.    Lab Results  Component Value Date   WBC 5.7 03/10/2017   HGB 11.0 (L) 03/10/2017   HCT 32.7 (L) 03/10/2017   MCV 85 03/10/2017   PLT 352 03/10/2017      There are no active problems to display for this patient.  Past Medical History:  Diagnosis Date  . Anemia   . Hypertension   . No pertinent past medical history    Past Surgical History:  Procedure Laterality Date  . CESAREAN SECTION    . TUBAL LIGATION Bilateral 11/26/2015   Procedure: POST PARTUM TUBAL LIGATION;  Surgeon: Catalina Antigua, MD;  Location: [facility];  Service: Gynecology;  Laterality: Bilateral;   No Known Allergies Prior to Admission medications   Medication Sig Start Date End Date Taking? Authorizing Provider  lisinopril (PRINIVIL,ZESTRIL) 10 MG tablet Take 1 tablet (10 mg total) by mouth daily. 03/09/17  Yes Shade Flood, MD  ferrous sulfate 325 (65 FE) MG tablet Take 325 mg by mouth daily with breakfast.    [provider]   Social History   Socioeconomic History  . Marital status: Married    Spouse name: Not on file  . Number of children: Not on file  .  Years of education: Not on file  . Highest education level: Not on file  Social Needs  . Financial resource strain: Not on file  . Food insecurity - worry: Not on file  . Food insecurity - inability: Not on file  . Transportation needs - medical: Not on file  . Transportation needs - non-medical: Not on file  Occupational History  . Not on file  Tobacco Use  . Smoking status: Never Smoker  . Smokeless tobacco: Never Used  Substance and Sexual Activity  . Alcohol use: No  . Drug use: No  . Sexual activity: Yes    Birth control/protection: Surgical  Other Topics Concern  . Not on file  Social History Narrative  . Not on file      Review of Systems  Constitutional: Negative for fatigue and unexpected weight change.  Respiratory: Negative for cough, chest tightness and  shortness of breath.   Cardiovascular: Negative for chest pain, palpitations and leg swelling.  Gastrointestinal: Negative for abdominal pain and blood in stool.  Genitourinary:       (-) blood in stool, (-) melena   Neurological: Negative for dizziness, syncope, light-headedness and headaches.       Objective:   Physical Exam  Constitutional: She is oriented to person, place, and time. She appears well-developed and well-nourished.  HENT:  Head: Normocephalic and atraumatic.  Eyes: Conjunctivae and EOM are normal. Pupils are equal, round, and reactive to light.  Neck: Carotid bruit is not present.  Cardiovascular: Normal rate, regular rhythm, normal heart sounds and intact distal pulses.  Pulmonary/Chest: Effort normal and breath sounds normal.  Abdominal: Soft. She exhibits no pulsatile midline mass. There is no tenderness.  Neurological: She is alert and oriented to person, place, and time.  Skin: Skin is warm and dry.  Psychiatric: She has a normal mood and affect. Her behavior is normal.  Vitals reviewed.   Vitals:   09/07/17 0916  BP: 118/75  Pulse: 70  Resp: 16  Temp: 97.8 F (36.6 C)  TempSrc: Oral  SpO2: 100%  Weight: 141 lb 6.4 oz (64.1 kg)  Height: 5\' 1"  (1.549 m)         Assessment & Plan:   Demeisha Geraghty is a 47 y.o. female Essential hypertension - Plan: Comprehensive metabolic panel, lisinopril (PRINIVIL,ZESTRIL) 10 MG tablet  - Stable at current dose of lisinopril, tolerating without any side effects. Continue same, labs pending  Hyperlipidemia, unspecified hyperlipidemia type - Plan: Lipid panel, Comprehensive metabolic panel  -Borderline at previous visit. Repeat labs, commended on weight loss. Continue activity, watching diet  Anemia, unspecified type - Plan: CBC  -Borderline on most recent CBC, 5 supplements at this time. Recommended foods that are high in iron, may not need supplement. Repeat CBC obtained.  History of elevated LFT,  improved last visit, suspect that will continue previous weight decreases. Repeat labs today.  At end of visit, noted left-sided neck pain, this was discussed at previous visit. Plans for Tylenol over-the-counter, range of motion, stretches and return to recheck and discuss further as may need imaging.  Spanish spoken, understanding expressed.  Meds ordered this encounter  Medications  . lisinopril (PRINIVIL,ZESTRIL) 10 MG tablet    Sig: Take 1 tablet (10 mg total) by mouth daily.    Dispense:  90 tablet    Refill:  1   Patient Instructions   mismo medicina a esta tiempo. regrese en 6 meses.   por su cuello - tylenol si necesario,  pero regrese si Charity fundraisersimptomas remanecer.     IF you received an x-ray today, you will receive an invoice from [facility]. Please contact [facility] at [phone] with questions or concerns regarding your invoice.   IF you received labwork today, you will receive an invoice from [facility]. Please contact [facility] at [phone] with questions or concerns regarding your invoice.   Our billing staff will not be able to assist you with questions regarding bills from these companies.  You will be contacted with the lab results as soon as they are available. The fastest way to get your results is to activate your My Chart account. Instructions are located on the last page of this paperwork. If you have not heard from us regarding the results in 2 weeks, please contact this office.      I personally performed the services described in this documentation, which was scribed in my presence. The recorded information has been reviewed and considered for accuracy and completeness, addended by me as needed, and agree with information above.  Signed,   Christy StaggersJeffrey Iracema Lanagan, MD Primary Care at [facility].  09/07/17 9:54 AM

## 2017-09-07 NOTE — Patient Instructions (Addendum)
mismo medicina a Risk analystesta tiempo. regrese en 6 meses.   por su cuello - tylenol si necesario, pero regrese si simptomas remanecer.     IF you received an x-ray today, you will receive an invoice from [facility]. Please contact [facility] at [phone] with questions or concerns regarding your invoice.   IF you received labwork today, you will receive an invoice from [facility]. Please contact [facility] at [phone] with questions or concerns regarding your invoice.   Our billing staff will not be able to assist you with questions regarding bills from these companies.  You will be contacted with the lab results as soon as they are available. The fastest way to get your results is to activate your My Chart account. Instructions are located on the last page of this paperwork. If you have not heard from us regarding the results in 2 weeks, please contact this office.

## 2017-09-08 LAB — COMPREHENSIVE METABOLIC PANEL
A/G RATIO: 1.6 (ref 1.2–2.2)
ALBUMIN: 4.6 g/dL (ref 3.5–5.5)
ALT: 16 IU/L (ref 0–32)
AST: 14 IU/L (ref 0–40)
Alkaline Phosphatase: 77 IU/L (ref 39–117)
BUN / CREAT RATIO: 13 (ref 9–23)
BUN: 8 mg/dL (ref 6–24)
Bilirubin Total: 0.3 mg/dL (ref 0.0–1.2)
CALCIUM: 8.9 mg/dL (ref 8.7–10.2)
CO2: 22 mmol/L (ref 20–29)
Chloride: 103 mmol/L (ref 96–106)
Creatinine, Ser: 0.63 mg/dL (ref 0.57–1.00)
GFR, EST AFRICAN AMERICAN: 124 mL/min/{1.73_m2} (ref >59)
GFR, EST NON AFRICAN AMERICAN: 107 mL/min/{1.73_m2} (ref >59)
Globulin, Total: 2.8 g/dL (ref 1.5–4.5)
Glucose: 88 mg/dL (ref 65–99)
Potassium: 4.5 mmol/L (ref 3.5–5.2)
Sodium: 139 mmol/L (ref 134–144)
TOTAL PROTEIN: 7.4 g/dL (ref 6.0–8.5)

## 2017-09-08 LAB — CBC
HEMATOCRIT: 33.7 % — AB (ref 34.0–46.6)
Hemoglobin: 10.8 g/dL — ABNORMAL LOW (ref 11.1–15.9)
MCH: 24.7 pg — AB (ref 26.6–33.0)
MCHC: 32 g/dL (ref 31.5–35.7)
MCV: 77 fL — ABNORMAL LOW (ref 79–97)
Platelets: 420 10*3/uL — ABNORMAL HIGH (ref 150–379)
RBC: 4.38 x10E6/uL (ref 3.77–5.28)
RDW: 15.9 % — ABNORMAL HIGH (ref 12.3–15.4)
WBC: 6.6 10*3/uL (ref 3.4–10.8)

## 2017-09-08 LAB — LIPID PANEL
CHOLESTEROL TOTAL: 184 mg/dL (ref 100–199)
Chol/HDL Ratio: 5 ratio — ABNORMAL HIGH (ref 0.0–4.4)
HDL: 37 mg/dL — AB (ref >39)
LDL Calculated: 117 mg/dL — ABNORMAL HIGH (ref 0–99)
Triglycerides: 151 mg/dL — ABNORMAL HIGH (ref 0–149)
VLDL Cholesterol Cal: 30 mg/dL (ref 5–40)

## 2017-09-19 ENCOUNTER — Ambulatory Visit (INDEPENDENT_AMBULATORY_CARE_PROVIDER_SITE_OTHER): Payer: Self-pay | Admitting: Emergency Medicine

## 2017-09-19 ENCOUNTER — Other Ambulatory Visit: Payer: Self-pay

## 2017-09-19 ENCOUNTER — Encounter: Payer: Self-pay | Admitting: Emergency Medicine

## 2017-09-19 VITALS — BP 101/64 | HR 92 | Temp 99.4°F | Resp 16 | Ht 60.5 in | Wt 137.0 lb

## 2017-09-19 DIAGNOSIS — R519 Headache, unspecified: Secondary | ICD-10-CM | POA: Insufficient documentation

## 2017-09-19 DIAGNOSIS — R509 Fever, unspecified: Secondary | ICD-10-CM

## 2017-09-19 DIAGNOSIS — B349 Viral infection, unspecified: Secondary | ICD-10-CM

## 2017-09-19 DIAGNOSIS — R51 Headache: Secondary | ICD-10-CM

## 2017-09-19 DIAGNOSIS — M791 Myalgia, unspecified site: Secondary | ICD-10-CM | POA: Insufficient documentation

## 2017-09-19 DIAGNOSIS — R6889 Other general symptoms and signs: Secondary | ICD-10-CM

## 2017-09-19 LAB — POC INFLUENZA A&B (BINAX/QUICKVUE)
Influenza A, POC: NEGATIVE
Influenza B, POC: NEGATIVE

## 2017-09-19 MED ORDER — HYDROCODONE-ACETAMINOPHEN 5-325 MG PO TABS: 1.0000 | ORAL_TABLET | Freq: Four times a day (QID) | ORAL | 0 refills | Status: DC | PRN

## 2017-09-19 NOTE — Patient Instructions (Addendum)
   IF you received an x-ray today, you will receive an invoice from [facility]. Please contact [facility] at [phone] with questions or concerns regarding your invoice.   IF you received labwork today, you will receive an invoice from [facility]. Please contact [facility] at [phone] with questions or concerns regarding your invoice.   Our billing staff will not be able to assist you with questions regarding bills from these companies.  You will be contacted with the lab results as soon as they are available. The fastest way to get your results is to activate your My Chart account. Instructions are located on the last page of this paperwork. If you have not heard from us regarding the results in 2 weeks, please contact this office.     Viral Illness, Adult Viruses are tiny germs that can get into a person's body and cause illness. There are many different types of viruses, and they cause many types of illness. Viral illnesses can range from mild to severe. They can affect various parts of the body. Common illnesses that are caused by a virus include colds and the flu. Viral illnesses also include serious conditions such as HIV/AIDS (human immunodeficiency virus/acquired immunodeficiency syndrome). A few viruses have been linked to certain cancers. What are the causes? Many types of viruses can cause illness. Viruses invade cells in your body, multiply, and cause the infected cells to malfunction or die. When the cell dies, it releases more of the virus. When this happens, you develop symptoms of the illness, and the virus continues to spread to other cells. If the virus takes over the function of the cell, it can cause the cell to divide and grow out of control, as is the case when a virus causes cancer. Different viruses get into the body in different ways. You can get a virus by:  Swallowing food or water that is contaminated with the virus.  Breathing in droplets  that have been coughed or sneezed into the air by an infected person.  Touching a surface that has been contaminated with the virus and then touching your eyes, nose, or mouth.  Being bitten by an insect or animal that carries the virus.  Having sexual contact with a person who is infected with the virus.  Being exposed to blood or fluids that contain the virus, either through an open cut or during a transfusion.  If a virus enters your body, your body's defense system (immune system) will try to fight the virus. You may be at higher risk for a viral illness if your immune system is weak. What are the signs or symptoms? Symptoms vary depending on the type of virus and the location of the cells that it invades. Common symptoms of the main types of viral illnesses include: Cold and flu viruses  Fever.  Headache.  Sore throat.  Muscle aches.  Nasal congestion.  Cough. Digestive system (gastrointestinal) viruses  Fever.  Abdominal pain.  Nausea.  Diarrhea. Liver viruses (hepatitis)  Loss of appetite.  Tiredness.  Yellowing of the skin (jaundice). Brain and spinal cord viruses  Fever.  Headache.  Stiff neck.  Nausea and vomiting.  Confusion or sleepiness. Skin viruses  Warts.  Itching.  Rash. Sexually transmitted viruses  Discharge.  Swelling.  Redness.  Rash. How is this treated? Viruses can be difficult to treat because they live within cells. Antibiotic medicines do not treat viruses because these drugs do not get inside cells. Treatment for a viral illness   may include:  Resting and drinking plenty of fluids.  Medicines to relieve symptoms. These can include over-the-counter medicine for pain and fever, medicines for cough or congestion, and medicines to relieve diarrhea.  Antiviral medicines. These drugs are available only for certain types of viruses. They may help reduce flu symptoms if taken early. There are also many antiviral medicines  for hepatitis and HIV/AIDS.  Some viral illnesses can be prevented with vaccinations. A common example is the flu shot. Follow these instructions at home: Medicines   Take over-the-counter and prescription medicines only as told by your health care provider.  If you were prescribed an antiviral medicine, take it as told by your health care provider. Do not stop taking the medicine even if you start to feel better.  Be aware of when antibiotics are needed and when they are not needed. Antibiotics do not treat viruses. If your health care provider thinks that you may have a bacterial infection as well as a viral infection, you may get an antibiotic. ? Do not ask for an antibiotic prescription if you have been diagnosed with a viral illness. That will not make your illness go away faster. ? Frequently taking antibiotics when they are not needed can lead to antibiotic resistance. When this develops, the medicine no longer works against the bacteria that it normally fights. General instructions  Drink enough fluids to keep your urine clear or pale yellow.  Rest as much as possible.  Return to your normal activities as told by your health care provider. Ask your health care provider what activities are safe for you.  Keep all follow-up visits as told by your health care provider. This is important. How is this prevented? Take these actions to reduce your risk of viral infection:  Eat a healthy diet and get enough rest.  Wash your hands often with soap and water. This is especially important when you are in public places. If soap and water are not available, use hand sanitizer.  Avoid close contact with friends and family who have a viral illness.  If you travel to areas where viral gastrointestinal infection is common, avoid drinking water or eating raw food.  Keep your immunizations up to date. Get a flu shot every year as told by your health care provider.  Do not share toothbrushes,  nail clippers, razors, or needles with other people.  Always practice safe sex.  Contact a health care provider if:  You have symptoms of a viral illness that do not go away.  Your symptoms come back after going away.  Your symptoms get worse. Get help right away if:  You have trouble breathing.  You have a severe headache or a stiff neck.  You have severe vomiting or abdominal pain. This information is not intended to replace advice given to you by your health care provider. Make sure you discuss any questions you have with your health care provider. Document Released: 11/13/2015 Document Revised: 12/16/2015 Document Reviewed: 11/13/2015 Elsevier Interactive Patient Education  2018 [employer]  

## 2017-09-19 NOTE — Progress Notes (Signed)
Christy Hernandez 47 y.o.   Chief Complaint  Patient presents with  . Fever    101.0 degrees and bodyaches, Sxs started 09/13/17  . Headache    HISTORY OF PRESENT ILLNESS: This is a 47 y.o. female complaining of flulike symptoms that started Wednesday last week (6 days ago).  Mostly complaining of a dry cough with initial fever and persistent body aches.  Also complaining of intermittent headaches.  States it does not feel like the common cold.  Some family members also sick but no one diagnosed with influenza.   HPI   Prior to Admission medications   Medication Sig Start Date End Date Taking? Authorizing Provider  lisinopril (PRINIVIL,ZESTRIL) 10 MG tablet Take 1 tablet (10 mg total) by mouth daily. 09/07/17  Yes Shade Flood, MD  ferrous sulfate 325 (65 FE) MG tablet Take 325 mg by mouth daily with breakfast.    [provider]    No Known Allergies  There are no active problems to display for this patient.   Past Medical History:  Diagnosis Date  . Anemia   . Hypertension   . No pertinent past medical history     Past Surgical History:  Procedure Laterality Date  . CESAREAN SECTION    . TUBAL LIGATION Bilateral 11/26/2015   Procedure: POST PARTUM TUBAL LIGATION;  Surgeon: Catalina Antigua, MD;  Location: [facility];  Service: Gynecology;  Laterality: Bilateral;    Social History   Socioeconomic History  . Marital status: Married    Spouse name: Not on file  . Number of children: Not on file  . Years of education: Not on file  . Highest education level: Not on file  Social Needs  . Financial resource strain: Not on file  . Food insecurity - worry: Not on file  . Food insecurity - inability: Not on file  . Transportation needs - medical: Not on file  . Transportation needs - non-medical: Not on file  Occupational History  . Not on file  Tobacco Use  . Smoking status: Never Smoker  . Smokeless tobacco: Never Used  Substance and Sexual Activity    . Alcohol use: No  . Drug use: No  . Sexual activity: Yes    Birth control/protection: Surgical  Other Topics Concern  . Not on file  Social History Narrative  . Not on file    Family History  Problem Relation Age of Onset  . Hypertension Mother   . Hypertension Father   . Hypertension Sister      Review of Systems  Constitutional: Positive for chills, fever and malaise/fatigue.  HENT: Negative.  Negative for congestion, ear pain, nosebleeds, sinus pain and sore throat.   Eyes: Negative.  Negative for discharge and redness.  Respiratory: Positive for cough. Negative for hemoptysis, sputum production, shortness of breath and wheezing.   Cardiovascular: Negative.  Negative for chest pain, palpitations, claudication and leg swelling.  Gastrointestinal: Negative.  Negative for abdominal pain, blood in stool, diarrhea, nausea and vomiting.  Genitourinary: Negative.  Negative for dysuria and hematuria.  Musculoskeletal: Positive for myalgias. Negative for back pain, joint pain and neck pain.  Skin: Negative.  Negative for rash.  Neurological: Positive for headaches. Negative for dizziness.  Endo/Heme/Allergies: Negative.   All other systems reviewed and are negative.   Vitals:   09/19/17 1153  BP: 101/64  Pulse: 92  Resp: 16  Temp: 99.4 F (37.4 C)  SpO2: 96%    Physical Exam  Constitutional:  She is oriented to person, place, and time. She appears well-developed and well-nourished.  HENT:  Head: Normocephalic and atraumatic.  Right Ear: External ear normal.  Left Ear: External ear normal.  Nose: Nose normal.  Mouth/Throat: Oropharynx is clear and moist.  Eyes: Conjunctivae and EOM are normal. Pupils are equal, round, and reactive to light.  Neck: Normal range of motion. Neck supple. No JVD present. No thyromegaly present.  Cardiovascular: Normal rate, regular rhythm and normal heart sounds.  Pulmonary/Chest: Effort normal and breath sounds normal.  Abdominal: Soft.  Bowel sounds are normal. She exhibits no distension. There is no tenderness.  Musculoskeletal: Normal range of motion. She exhibits no edema or tenderness.  Lymphadenopathy:    She has no cervical adenopathy.  Neurological: She is alert and oriented to person, place, and time. She displays normal reflexes. No cranial nerve deficit or sensory deficit. She exhibits normal muscle tone. Coordination normal.  Skin: Skin is warm and dry. Capillary refill takes less than 2 seconds. No rash noted.  Psychiatric: She has a normal mood and affect. Her behavior is normal.   Results for orders placed or performed in visit on 09/19/17 (from the past 24 hour(s))  POC Influenza A&B(BINAX/QUICKVUE)     Status: None   Collection Time: 09/19/17 12:32 PM  Result Value Ref Range   Influenza A, POC Negative Negative   Influenza B, POC Negative Negative     ASSESSMENT & PLAN: Christy HesselbachMaria was seen today for fever and headache.  Diagnoses and all orders for this visit:  Viral illness  Flu-like symptoms -     POC Influenza A&B(BINAX/QUICKVUE) -     HYDROcodone-acetaminophen (NORCO) 5-325 MG tablet; Take 1 tablet by mouth every 6 (six) hours as needed for moderate pain.  Nonintractable headache, unspecified chronicity pattern, unspecified headache type -     HYDROcodone-acetaminophen (NORCO) 5-325 MG tablet; Take 1 tablet by mouth every 6 (six) hours as needed for moderate pain.  Fever, unspecified fever cause  Generalized muscle ache -     HYDROcodone-acetaminophen (NORCO) 5-325 MG tablet; Take 1 tablet by mouth every 6 (six) hours as needed for moderate pain.    Patient Instructions       IF you received an x-ray today, you will receive an invoice from [facility]. Please contact [facility] at [phone] with questions or concerns regarding your invoice.   IF you received labwork today, you will receive an invoice from [facility]. Please contact [facility] at [phone] with  questions or concerns regarding your invoice.   Our billing staff will not be able to assist you with questions regarding bills from these companies.  You will be contacted with the lab results as soon as they are available. The fastest way to get your results is to activate your My Chart account. Instructions are located on the last page of this paperwork. If you have not heard from us regarding the results in 2 weeks, please contact this office.     Viral Illness, Adult Viruses are tiny germs that can get into a person's body and cause illness. There are many different types of viruses, and they cause many types of illness. Viral illnesses can range from mild to severe. They can affect various parts of the body. Common illnesses that are caused by a virus include colds and the flu. Viral illnesses also include serious conditions such as HIV/AIDS (human immunodeficiency virus/acquired immunodeficiency syndrome). A few viruses have been linked to certain cancers. What are the causes? Many  types of viruses can cause illness. Viruses invade cells in your body, multiply, and cause the infected cells to malfunction or die. When the cell dies, it releases more of the virus. When this happens, you develop symptoms of the illness, and the virus continues to spread to other cells. If the virus takes over the function of the cell, it can cause the cell to divide and grow out of control, as is the case when a virus causes cancer. Different viruses get into the body in different ways. You can get a virus by:  Swallowing food or water that is contaminated with the virus.  Breathing in droplets that have been coughed or sneezed into the air by an infected person.  Touching a surface that has been contaminated with the virus and then touching your eyes, nose, or mouth.  Being bitten by an insect or animal that carries the virus.  Having sexual contact with a person who is infected with the virus.  Being  exposed to blood or fluids that contain the virus, either through an open cut or during a transfusion.  If a virus enters your body, your body's defense system (immune system) will try to fight the virus. You may be at higher risk for a viral illness if your immune system is weak. What are the signs or symptoms? Symptoms vary depending on the type of virus and the location of the cells that it invades. Common symptoms of the main types of viral illnesses include: Cold and flu viruses  Fever.  Headache.  Sore throat.  Muscle aches.  Nasal congestion.  Cough. Digestive system (gastrointestinal) viruses  Fever.  Abdominal pain.  Nausea.  Diarrhea. Liver viruses (hepatitis)  Loss of appetite.  Tiredness.  Yellowing of the skin (jaundice). Brain and spinal cord viruses  Fever.  Headache.  Stiff neck.  Nausea and vomiting.  Confusion or sleepiness. Skin viruses  Warts.  Itching.  Rash. Sexually transmitted viruses  Discharge.  Swelling.  Redness.  Rash. How is this treated? Viruses can be difficult to treat because they live within cells. Antibiotic medicines do not treat viruses because these drugs do not get inside cells. Treatment for a viral illness may include:  Resting and drinking plenty of fluids.  Medicines to relieve symptoms. These can include over-the-counter medicine for pain and fever, medicines for cough or congestion, and medicines to relieve diarrhea.  Antiviral medicines. These drugs are available only for certain types of viruses. They may help reduce flu symptoms if taken early. There are also many antiviral medicines for hepatitis and HIV/AIDS.  Some viral illnesses can be prevented with vaccinations. A common example is the flu shot. Follow these instructions at home: Medicines   Take over-the-counter and prescription medicines only as told by your health care provider.  If you were prescribed an antiviral medicine, take it  as told by your health care provider. Do not stop taking the medicine even if you start to feel better.  Be aware of when antibiotics are needed and when they are not needed. Antibiotics do not treat viruses. If your health care provider thinks that you may have a bacterial infection as well as a viral infection, you may get an antibiotic. ? Do not ask for an antibiotic prescription if you have been diagnosed with a viral illness. That will not make your illness go away faster. ? Frequently taking antibiotics when they are not needed can lead to antibiotic resistance. When this develops, the medicine no longer  works against the bacteria that it normally fights. General instructions  Drink enough fluids to keep your urine clear or pale yellow.  Rest as much as possible.  Return to your normal activities as told by your health care provider. Ask your health care provider what activities are safe for you.  Keep all follow-up visits as told by your health care provider. This is important. How is this prevented? Take these actions to reduce your risk of viral infection:  Eat a healthy diet and get enough rest.  Wash your hands often with soap and water. This is especially important when you are in public places. If soap and water are not available, use hand sanitizer.  Avoid close contact with friends and family who have a viral illness.  If you travel to areas where viral gastrointestinal infection is common, avoid drinking water or eating raw food.  Keep your immunizations up to date. Get a flu shot every year as told by your health care provider.  Do not share toothbrushes, nail clippers, razors, or needles with other people.  Always practice safe sex.  Contact a health care provider if:  You have symptoms of a viral illness that do not go away.  Your symptoms come back after going away.  Your symptoms get worse. Get help right away if:  You have trouble breathing.  You have a  severe headache or a stiff neck.  You have severe vomiting or abdominal pain. This information is not intended to replace advice given to you by your health care provider. Make sure you discuss any questions you have with your health care provider. Document Released: 11/13/2015 Document Revised: 12/16/2015 Document Reviewed: 11/13/2015 Elsevier Interactive Patient Education  2018 Elsevier Inc.      Edwina Barth, MD [facility]

## 2017-12-01 ENCOUNTER — Other Ambulatory Visit: Payer: Self-pay | Admitting: Obstetrics and Gynecology

## 2017-12-01 DIAGNOSIS — Z1231 Encounter for screening mammogram for malignant neoplasm of breast: Secondary | ICD-10-CM

## 2017-12-21 ENCOUNTER — Ambulatory Visit
Admission: RE | Admit: 2017-12-21 | Discharge: 2017-12-21 | Disposition: A | Discharge status: Home / Self Care | Payer: No Typology Code available for payment source | Source: Ambulatory Visit | Attending: Obstetrics and Gynecology | Admitting: Obstetrics and Gynecology

## 2017-12-21 ENCOUNTER — Encounter (HOSPITAL_COMMUNITY): Payer: Self-pay

## 2017-12-21 ENCOUNTER — Ambulatory Visit (HOSPITAL_COMMUNITY)
Admission: RE | Admit: 2017-12-21 | Discharge: 2017-12-21 | Disposition: A | Discharge status: Home / Self Care | Payer: Self-pay | Source: Ambulatory Visit | Attending: Obstetrics and Gynecology | Admitting: Obstetrics and Gynecology

## 2017-12-21 ENCOUNTER — Encounter (HOSPITAL_COMMUNITY): Payer: Self-pay | Admitting: *Deleted

## 2017-12-21 VITALS — BP 122/84 | Wt 140.0 lb

## 2017-12-21 DIAGNOSIS — Z1239 Encounter for other screening for malignant neoplasm of breast: Secondary | ICD-10-CM

## 2017-12-21 DIAGNOSIS — Z1231 Encounter for screening mammogram for malignant neoplasm of breast: Secondary | ICD-10-CM

## 2017-12-21 NOTE — Progress Notes (Signed)
No complaints today.   Pap Smear: Pap smear not completed today. [facility] and normal per patient. Per patient has no history of an abnormal Pap smear. No Pap smear results are in EPIC.  Physical exam: Breasts Breasts symmetrical. Two scars observed left breast at 9 o'clock from history of breast infection per patient. Two scars observed right breast at 12 o'clock and 2 o'clock from history of breast infection per patient. No nipple retraction bilateral breasts. No nipple discharge bilateral breasts. No lymphadenopathy. No lumps palpated bilateral breasts. No complaints of pain or tenderness on exam. [facility] mamm[facility]. Appointment scheduled for Thursday, December 21, 2017 at 1310.    Pelvic/Bimanual No Pap smear completed today since last Pap smear was in September 2017 per patient. Pap smear not indicated per BCCCP guidelines.   Smoking History: Patient has never smoked.  Patient Navigation: Patient education provided. Access to services provided for [facility] through [facility]. Spanish interpreter provided.   Breast and Cervical Cancer Risk Assessment: Patient has no family history of breast cancer, known genetic mutations, or radiation treatment to the chest before age 48. Patient has no history of cervical dysplasia, immunocompromised, or DES exposure in-utero. Patient has a 5-year risk for breast cancer at 0.7% and a lifetime risk at 6.5%.  Used Spanish interpreter [employer]Erika McReynolds from [employer].

## 2017-12-21 NOTE — Patient Instructions (Addendum)
Explained breast self awareness with Christy Hernandez. Patient did not need a Pap smear today due to last Pap smear was in September 2017 per patient. Let her know BCCCP will cover Pap smears every 3 years unless has a history of abnormal Pap smears. [facility]. Appointment scheduled for Thursday, December 21, 2017 at 1310. Let patient know the Breast center will follow up with her within the next couple weeks with results of mammogram by letter or phone. [name] verbalized understanding.  [name], Kathaleen Maserhristine Poll, RN 12:38 PM

## 2018-02-23 ENCOUNTER — Other Ambulatory Visit (HOSPITAL_COMMUNITY): Payer: Self-pay | Admitting: *Deleted

## 2018-02-23 DIAGNOSIS — N6310 Unspecified lump in the right breast, unspecified quadrant: Secondary | ICD-10-CM

## 2018-03-08 ENCOUNTER — Ambulatory Visit: Payer: Self-pay | Admitting: Family Medicine

## 2018-03-08 ENCOUNTER — Encounter: Payer: Self-pay | Admitting: Family Medicine

## 2018-03-08 ENCOUNTER — Other Ambulatory Visit: Payer: Self-pay

## 2018-03-08 VITALS — BP 126/77 | HR 69 | Temp 97.6°F | Ht 60.0 in | Wt 144.4 lb

## 2018-03-08 DIAGNOSIS — E785 Hyperlipidemia, unspecified: Secondary | ICD-10-CM

## 2018-03-08 DIAGNOSIS — I10 Essential (primary) hypertension: Secondary | ICD-10-CM

## 2018-03-08 DIAGNOSIS — D649 Anemia, unspecified: Secondary | ICD-10-CM

## 2018-03-08 MED ORDER — LISINOPRIL 10 MG PO TABS: 10.0000 mg | ORAL_TABLET | Freq: Every day | ORAL | 1 refills | Status: DC

## 2018-03-08 NOTE — Patient Instructions (Addendum)
Iron o comida con iron cada dia. regrese en 6 meses. mismo medicina.     Anemia Anemia La anemia es una afeccin en la cual no hay suficientes glbulos rojos o hemoglobina. La hemoglobina es la sustancia de los glbulos rojos que lleva el oxgeno. Cuando no hay suficientes glbulos rojos o hemoglobina (est anmico), su cuerpo no puede recibir el oxgeno suficiente, y es posible que sus rganos no funcionen correctamente. Como Burnsresultado, es posible que se sienta muy cansado o sufra otros problemas. Cules son las causas? Las causas ms frecuentes de anemia son:  Sharlyne PacasSangrado excesivo. La anemia puede ser causada por un sangrado excesivo dentro o fuera del cuerpo, incluido el sangrado del intestino o del perodo en las mujeres.  Dficit nutricional.  Enfermedad heptica, tiroidea o renal (crnicas).  Trastornos de la mdula sea.  Cncer y tratamientos para Management consultantel cncer.  VIH (virus de inmunodeficiencia Ghanahumana) y SIDA (sndrome de inmunodeficiencia adquirida).  Tratamientos para el VIH y Hendricksel SIDA.  Problemas en el bazo.  Enfermedades de Clear Channel Communicationsla sangre.  Infecciones, medicamentos y enfermedades autoinmunes que American Electric Powerdestruyen los glbulos rojos.  Cules son los signos o los sntomas? Los sntomas de esta afeccin incluyen los siguientes:  Debilidad leve.  Mareos.  Dolor de Turkmenistancabeza.  Sensacin de latidos cardacos irregulares o ms rpidos que lo normal (palpitaciones).  Falta de aire, especialmente con el ejercicio.  Palidez.  Sensibilidad al fro.  Dispepsia.  Nuseas.  Dificultad para dormir.  Dificultad para concentrarse.  Los sntomas pueden ocurrir repentinamente o Audiological scientistmanifestarse lentamente. Si la anemia es leve, es posible que no tenga sntomas. Cmo se diagnostica? Esta afeccin se diagnostica en funcin de lo siguiente:  Anlisis de sangre.  Sus antecedentes mdicos.  Un examen fsico.  Biopsia de mdula sea.  Adems, el mdico puede controlar si hay sangre en  sus heces (materia fecal) y Education officer, environmentalrealizar anlisis adicionales para Engineer, manufacturingdetectar la causa del sangrado. Tambin pueden hacerle otros estudios, por ejemplo:  Pruebas de diagnstico por imgenes, como una resonancia magntica (RM) o una exploracin por tomografa computarizada (TC).  Endoscopia.  Colonoscopia.  Cmo se trata? El tratamiento de esta afeccin depende de la causa. Si contina perdiendo The Progressive Corporationmucha sangre, es posible que necesite recibir tratamiento en un hospital. El tratamiento puede incluir lo siguiente:  Tomar suplementos de hierro, vitamina B12 o cido flico.  Tomar un medicamento para las hormonas (eritropoyetina) que puede ayudar a Radio producerestimular el desarrollo de glbulos rojos.  Recibir una transfusin de [place] Vinc[place]re. Esta ser necesaria si pierde The Progressive Corporationmucha sangre.  Realizar cambios en la dieta.  Someterse a [place]una ciruga para Public house managerextirpar el bazo.  Siga estas indicaciones en su casa:  Tome los medicamentos de venta libre y los recetados solamente como se lo haya indicado el mdico.  Tome los suplementos solamente como se lo haya indicado el mdico.  Siga las instrucciones de la dieta que le hayan dado.  Concurra a todas las visitas de [address] como se lo haya indicado el mdico. Esto es importante. Comunquese con un mdico si:  Tiene nuevos sangrados en cualquier parte del cuerpo. Solicite ayuda de inmediato si:  Se siente muy dbil.  Le falta el aire.  Siente dolor en la espalda, el abdomen o el pecho.  Se siente mareado o sufre un desmayo.  Tiene dificultad para concentrarse.  Las heces son alquitranadas, sanguinolentas o negras.  Vomita repetidamente o vomita sangre. Resumen  La anemia es una afeccin en la que no hay suficientes glbulos rojos o la cantidad suficiente de la  sustancia de los glbulos rojos que transporta el oxgeno (hemoglobina).  Los sntomas pueden ocurrir repentinamente o Audiological scientist.  Si la anemia es leve, es posible que no tenga  sntomas.  Esta afeccin se diagnostica mediante anlisis de sangre y un examen fsico, y en funcin de sus antecedentes mdicos. Pueden ser necesarios otros estudios.  El tratamiento de esta afeccin depende de la causa de la anemia. Esta informacin no tiene Theme park manager el consejo del mdico. Asegrese de hacerle al mdico cualquier pregunta que tenga. Document Released: 07/04/2005 Document Revised: 10/24/2016 Document Reviewed: 10/24/2016 Elsevier Interactive Patient Education  [employer].   If you have lab work done today you will be contacted with your lab results within the next 2 weeks.  If you have not heard from [place] then please contact us. The fastest way to get your results is to register for My Chart.   IF you received an x-ray today, you will receive an invoice from [facility]. Please contact [facility] at [phone] with questions or concerns regarding your invoice.   IF you received labwork today, you will receive an invoice from [facility]. Please contact [facility] at [phone] with questions or concerns regarding your invoice.   Our billing staff will not be able to assist you with questions regarding bills from these companies.  You will be contacted with the lab results as soon as they are available. The fastest way to get your results is to activate your My Chart account. Instructions are located on the last page of this paperwork. If you have not heard from [place] regarding the results in 2 weeks, please contact this office.

## 2018-03-08 NOTE — Progress Notes (Signed)
Subjective:  By signing my name below, I, Christy Hernandez, attest that this documentation has been prepared under the direction and in the presence of Shade Flood, MD Electronically Signed: Charline Bills, ED Scribe 03/08/2018 at 9:40 AM.   Patient ID: Christy Hernandez, female    DOB: [birth year], 47 y.o.   MRN: [id number]  Chief Complaint  Patient presents with  . Chronic Conditions    HYPERTENSION, CHOLESTROL AND ANEMIA     6 m F/U  . Medication Refill   HPI Christy Hernandez Christy Hernandez is a 47 y.o. female who presents to Primary Care at [facility] for f/u. Spanish spoken and has someone present to interpret. Pt is fasting at this visit.  HTN Lab Results  Component Value Date   CREATININE 0.63 09/07/2017   BP Readings from Last 3 Encounters:  03/08/18 126/77  12/21/17 122/84  09/19/17 101/64  Lisinopril 10 mg qd. - Denies any side-effects from meds.  Anemia Lab Results  Component Value Date   WBC 6.6 09/07/2017   HGB 10.8 (L) 09/07/2017   HCT 33.7 (L) 09/07/2017   MCV 77 (L) 09/07/2017   PLT 420 (H) 09/07/2017  Recommended iron rich foods or OTC supplement, planned recheck in 4-6 wks. This is her first visit back since that time. - Pt has not started iron supplement. Denies blood in stool, family h/o colon CA. Last menstrual period was in Feb, reports that it began to space out prior. She has noticed hot flashes x 1 yr. Pshx includes tubal ligation.  Hyperlipidemia Lab Results  Component Value Date   CHOL 184 09/07/2017   HDL 37 (L) 09/07/2017   LDLCALC 117 (H) 09/07/2017   TRIG 151 (H) 09/07/2017   CHOLHDL 5.0 (H) 09/07/2017   Lab Results  Component Value Date   ALT 16 09/07/2017   AST 14 09/07/2017   ALKPHOS 77 09/07/2017   BILITOT 0.3 09/07/2017   The 10-year ASCVD risk score Denman George DC Jr., et al., 2013) is: 2.1%   Values used to calculate the score:     Age: 48 years     Sex: Female     Is Non-Hispanic African American: No     Diabetic: No  Tobacco smoker: No     Systolic Blood Pressure: 126 mmHg     Is BP treated: Yes     HDL Cholesterol: 37 mg/dL     Total Cholesterol: 184 mg/dL Not on a statin. Diet treatment discussed prev.  Patient Active Problem List   Diagnosis Date Noted  . Flu-like symptoms 09/19/2017  . Nonintractable headache 09/19/2017  . Fever 09/19/2017  . Generalized muscle ache 09/19/2017   Past Medical History:  Diagnosis Date  . Anemia   . Hypertension   . No pertinent past medical history    Past Surgical History:  Procedure Laterality Date  . CESAREAN SECTION    . TUBAL LIGATION Bilateral 11/26/2015   Procedure: POST PARTUM TUBAL LIGATION;  Surgeon: Catalina Antigua, MD;  Location: [facility];  Service: Gynecology;  Laterality: Bilateral;   No Known Allergies Prior to Admission medications   Medication Sig Start Date End Date Taking? Authorizing Provider  lisinopril (PRINIVIL,ZESTRIL) 10 MG tablet Take 1 tablet (10 mg total) by mouth daily. 09/07/17   Shade Flood, MD   Social History   Socioeconomic History  . Marital status: Married    Spouse name: Not on file  . Number of children: Not on file  . Years of education: Not  on file  . Highest education level: Not on file  Occupational History  . Not on file  Social Needs  . Financial resource strain: Not on file  . Food insecurity:    Worry: Not on file    Inability: Not on file  . Transportation needs:    Medical: Not on file    Non-medical: Not on file  Tobacco Use  . Smoking status: Never Smoker  . Smokeless tobacco: Never Used  Substance and Sexual Activity  . Alcohol use: No  . Drug use: No  . Sexual activity: Yes    Birth control/protection: Surgical  Lifestyle  . Physical activity:    Days per week: Not on file    Minutes per session: Not on file  . Stress: Not on file  Relationships  . Social connections:    Talks on phone: Not on file    Gets together: Not on file    Attends religious service: Not on file     Active member of club or organization: Not on file    Attends meetings of clubs or organizations: Not on file    Relationship status: Not on file  . Intimate partner violence:    Fear of current or ex partner: Not on file    Emotionally abused: Not on file    Physically abused: Not on file    Forced sexual activity: Not on file  Other Topics Concern  . Not on file  Social History Narrative  . Not on file   Review of Systems  Constitutional: Positive for diaphoresis. Negative for fatigue and unexpected weight change.  Respiratory: Negative for chest tightness and shortness of breath.   Cardiovascular: Negative for chest pain, palpitations and leg swelling.  Gastrointestinal: Negative for abdominal pain and blood in stool.  Neurological: Negative for dizziness, syncope, light-headedness and headaches.      Objective:   Physical Exam  Constitutional: She is oriented to person, place, and time. She appears well-developed and well-nourished.  HENT:  Head: Normocephalic and atraumatic.  Eyes: Pupils are equal, round, and reactive to light. Conjunctivae and EOM are normal.  Neck: Carotid bruit is not present.  Cardiovascular: Normal rate, regular rhythm, normal heart sounds and intact distal pulses.  Pulmonary/Chest: Effort normal and breath sounds normal.  Abdominal: Soft. She exhibits no pulsatile midline mass. There is no tenderness.  Neurological: She is alert and oriented to person, place, and time.  Skin: Skin is warm and dry.  Psychiatric: She has a normal mood and affect. Her behavior is normal.  Vitals reviewed.  Vitals:   03/08/18 0925  BP: 126/77  Pulse: 69  Temp: 97.6 F (36.4 C)  TempSrc: Oral  SpO2: 98%  Weight: 144 lb 6.4 oz (65.5 kg)  Height: 5' (1.524 m)     Assessment & Plan:    Christy Hernandez is a 47 y.o. female Essential hypertension - Plan: Comprehensive metabolic panel, lisinopril (PRINIVIL,ZESTRIL) 10 MG tablet  -  Stable, tolerating  current regimen. Medications refilled. Labs pending as above.   Anemia, unspecified type - Plan: Iron, TIBC and Ferritin Panel, CBC  - iron rich foods discussed, may need supplement.  Iron panel.   Hyperlipidemia, unspecified hyperlipidemia type - Plan: Lipid panel  - low ASCVD risk, recheck labs. Diet control for now.   Recheck 6months.   Meds ordered this encounter  Medications  . lisinopril (PRINIVIL,ZESTRIL) 10 MG tablet    Sig: Take 1 tablet (10 mg total) by mouth  daily.    Dispense:  90 tablet    Refill:  1   Patient Instructions    Iron o comida con iron cada dia. regrese en 6 meses. mismo medicina.     Anemia Anemia La anemia es una afeccin en la cual no hay suficientes glbulos rojos o hemoglobina. La hemoglobina es la sustancia de los glbulos rojos que lleva el oxgeno. Cuando no hay suficientes glbulos rojos o hemoglobina (est anmico), su cuerpo no puede recibir el oxgeno suficiente, y es posible que sus rganos no funcionen correctamente. Como [place], es posible que se sienta muy cansado o sufra otros problemas. Cules son las causas? Las causas ms frecuentes de anemia son:  Sharlyne Pacas. La anemia puede ser causada por un sangrado excesivo dentro o fuera del cuerpo, incluido el sangrado del intestino o del perodo en las mujeres.  Dficit nutricional.  Enfermedad heptica, tiroidea o renal (crnicas).  Trastornos de la mdula sea.  Cncer y tratamientos para Management consultant.  VIH (virus de inmunodeficiencia [place]) y SIDA (sndrome de inmunodeficiencia adquirida).  Tratamientos para el VIH y [place].  Problemas en el bazo.  Enfermedades de [employer].  Infecciones, medicamentos y enfermedades autoinmunes que [employer] glbulos rojos.  Cules son los signos o los sntomas? Los sntomas de esta afeccin incluyen los siguientes:  Debilidad leve.  Mareos.  Dolor de [place].  Sensacin de latidos cardacos irregulares o ms rpidos que  lo normal (palpitaciones).  Falta de aire, especialmente con el ejercicio.  Palidez.  Sensibilidad al fro.  Dispepsia.  Nuseas.  Dificultad para dormir.  Dificultad para concentrarse.  Los sntomas pueden ocurrir repentinamente o Audiological scientist. Si la anemia es leve, es posible que no tenga sntomas. Cmo se diagnostica? Esta afeccin se diagnostica en funcin de lo siguiente:  Anlisis de sangre.  Sus antecedentes mdicos.  Un examen fsico.  Biopsia de mdula sea.  Adems, el mdico puede controlar si hay sangre en sus heces (materia fecal) y Education officer, environmental anlisis adicionales para Engineer, manufacturing la causa del sangrado. Tambin pueden hacerle otros estudios, por ejemplo:  Pruebas de diagnstico por imgenes, como una resonancia magntica (RM) o una exploracin por tomografa computarizada (TC).  Endoscopia.  Colonoscopia.  Cmo se trata? El tratamiento de esta afeccin depende de la causa. Si contina perdiendo [employer], es posible que necesite recibir tratamiento en un hospital. El tratamiento puede incluir lo siguiente:  Tomar suplementos de hierro, vitamina B12 o cido flico.  Tomar un medicamento para las hormonas (eritropoyetina) que puede ayudar a Radio producer de glbulos rojos.  Recibir una transfusin de [place]. Esta ser necesaria si pierde [employer].  Realizar cambios en la dieta.  Someterse a [place] para Public house manager.  Siga estas indicaciones en su casa:  Tome los medicamentos de venta libre y los recetados solamente como se lo haya indicado el mdico.  Tome los suplementos solamente como se lo haya indicado el mdico.  Siga las instrucciones de la dieta que le hayan dado.  Concurra a todas las visitas de [address] se lo haya indicado el mdico. Esto es importante. Comunquese con un mdico si:  Tiene nuevos sangrados en cualquier parte del cuerpo. Solicite ayuda de inmediato si:  Se siente muy  dbil.  Le falta el aire.  Siente dolor en la espalda, el abdomen o el pecho.  Se siente mareado o sufre un desmayo.  Tiene dificultad para concentrarse.  Las heces son alquitranadas, sanguinolentas o negras.  Vomita repetidamente o vomita  sangre. Resumen  La anemia es una afeccin en la que no hay suficientes glbulos rojos o la cantidad suficiente de la sustancia de los glbulos rojos que transporta el oxgeno (hemoglobina).  Los sntomas pueden ocurrir repentinamente o Audiological scientist.  Si la anemia es leve, es posible que no tenga sntomas.  Esta afeccin se diagnostica mediante anlisis de sangre y un examen fsico, y en funcin de sus antecedentes mdicos. Pueden ser necesarios otros estudios.  El tratamiento de esta afeccin depende de la causa de la anemia. Esta informacin no tiene Theme park manager el consejo del mdico. Asegrese de hacerle al mdico cualquier pregunta que tenga. Document Released: 07/04/2005 Document Revised: 10/24/2016 Document Reviewed: 10/24/2016 Elsevier Interactive Patient Education  [employer].   If you have lab work done today you will be contacted with your lab results within the next 2 weeks.  If you have not heard from [place] then please contact us. The fastest way to get your results is to register for My Chart.   IF you received an x-ray today, you will receive an invoice from [facility]. Please contact [facility] at [phone] with questions or concerns regarding your invoice.   IF you received labwork today, you will receive an invoice from [facility]. Please contact [facility] at [phone] with questions or concerns regarding your invoice.   Our billing staff will not be able to assist you with questions regarding bills from these companies.  You will be contacted with the lab results as soon as they are available. The fastest way to get your results is to activate your My Chart account. Instructions  are located on the last page of this paperwork. If you have not heard from [place] regarding the results in 2 weeks, please contact this office.      I personally performed the services described in this documentation, which was scribed in my presence. The recorded information has been reviewed and considered for accuracy and completeness, addended by me as needed, and agree with information above.  Signed,   Meredith Staggers, MD Primary Care at [facility].  03/08/18 9:49 AM

## 2018-03-09 LAB — COMPREHENSIVE METABOLIC PANEL
ALK PHOS: 105 IU/L (ref 39–117)
ALT: 45 IU/L — AB (ref 0–32)
AST: 25 IU/L (ref 0–40)
Albumin/Globulin Ratio: 1.5 (ref 1.2–2.2)
Albumin: 4.2 g/dL (ref 3.5–5.5)
BILIRUBIN TOTAL: 0.3 mg/dL (ref 0.0–1.2)
BUN/Creatinine Ratio: 19 (ref 9–23)
BUN: 11 mg/dL (ref 6–24)
CHLORIDE: 107 mmol/L — AB (ref 96–106)
CO2: 24 mmol/L (ref 20–29)
Calcium: 9.3 mg/dL (ref 8.7–10.2)
Creatinine, Ser: 0.59 mg/dL (ref 0.57–1.00)
GFR calc Af Amer: 125 mL/min/{1.73_m2} (ref >59)
GFR calc non Af Amer: 109 mL/min/{1.73_m2} (ref >59)
GLUCOSE: 91 mg/dL (ref 65–99)
Globulin, Total: 2.8 g/dL (ref 1.5–4.5)
Potassium: 4.4 mmol/L (ref 3.5–5.2)
Sodium: 145 mmol/L — ABNORMAL HIGH (ref 134–144)
TOTAL PROTEIN: 7 g/dL (ref 6.0–8.5)

## 2018-03-09 LAB — CBC
Hematocrit: 36.6 % (ref 34.0–46.6)
Hemoglobin: 11.7 g/dL (ref 11.1–15.9)
MCH: 27.3 pg (ref 26.6–33.0)
MCHC: 32 g/dL (ref 31.5–35.7)
MCV: 85 fL (ref 79–97)
PLATELETS: 346 10*3/uL (ref 150–450)
RBC: 4.29 x10E6/uL (ref 3.77–5.28)
RDW: 15.8 % — ABNORMAL HIGH (ref 12.3–15.4)
WBC: 6 10*3/uL (ref 3.4–10.8)

## 2018-03-09 LAB — LIPID PANEL
CHOLESTEROL TOTAL: 192 mg/dL (ref 100–199)
Chol/HDL Ratio: 5.2 ratio — ABNORMAL HIGH (ref 0.0–4.4)
HDL: 37 mg/dL — AB (ref >39)
LDL Calculated: 133 mg/dL — ABNORMAL HIGH (ref 0–99)
Triglycerides: 108 mg/dL (ref 0–149)
VLDL CHOLESTEROL CAL: 22 mg/dL (ref 5–40)

## 2018-03-09 LAB — IRON,TIBC AND FERRITIN PANEL
Ferritin: 22 ng/mL (ref 15–150)
IRON SATURATION: 16 % (ref 15–55)
IRON: 63 ug/dL (ref 27–159)
Total Iron Binding Capacity: 386 ug/dL (ref 250–450)
UIBC: 323 ug/dL (ref 131–425)

## 2018-03-22 ENCOUNTER — Other Ambulatory Visit (HOSPITAL_COMMUNITY): Payer: Self-pay | Admitting: Obstetrics and Gynecology

## 2018-03-22 ENCOUNTER — Ambulatory Visit (HOSPITAL_COMMUNITY)
Admission: RE | Admit: 2018-03-22 | Discharge: 2018-03-22 | Disposition: A | Discharge status: Home / Self Care | Payer: No Typology Code available for payment source | Source: Ambulatory Visit | Attending: Obstetrics and Gynecology | Admitting: Obstetrics and Gynecology

## 2018-03-22 ENCOUNTER — Encounter (HOSPITAL_COMMUNITY): Payer: Self-pay

## 2018-03-22 ENCOUNTER — Ambulatory Visit
Admission: RE | Admit: 2018-03-22 | Discharge: 2018-03-22 | Disposition: A | Discharge status: Home / Self Care | Payer: No Typology Code available for payment source | Source: Ambulatory Visit | Attending: Obstetrics and Gynecology | Admitting: Obstetrics and Gynecology

## 2018-03-22 VITALS — BP 124/72 | Wt 145.2 lb

## 2018-03-22 DIAGNOSIS — N6313 Unspecified lump in the right breast, lower outer quadrant: Secondary | ICD-10-CM

## 2018-03-22 DIAGNOSIS — N6310 Unspecified lump in the right breast, unspecified quadrant: Secondary | ICD-10-CM

## 2018-03-22 DIAGNOSIS — Z1239 Encounter for other screening for malignant neoplasm of breast: Secondary | ICD-10-CM

## 2018-03-22 NOTE — Progress Notes (Signed)
No complaints today.   Pap Smear: Pap smear not completed today. Last Pap smear was in September 2017 at the [facility] and normal per patient. Per patient has no history of an abnormal Pap smear. No Pap smear results are in EPIC.  Physical exam: Breasts Breasts symmetrical.Two scars observed left breast at 9 o'clock from history of breast infection per patient. Two scars observed right breast at 12 o'clock and 2 o'clock from history of breast infection per patient.Redness observed right breast between 1 o'clock and 6 o'clock next to nipple. No nipple retraction left breast. Right nipple retraction observed on exam. No nipple discharge bilateral breasts. No lymphadenopathy. No lumps palpated left breast. Palpated a lump within the right breast at 7 o'clock next to areola. Complaints of right breast pain on exam when palpated lump and reddened area. Referred patient to the [facility] for a right breast diagnostic mammogram and ultrasound.Appointment scheduled for Thursday,September 5, 2019at 1120.       Pelvic/Bimanual No Pap smear completed today since last Pap smear wasin September 2017 per patient. Pap smear not indicated per BCCCP guidelines.  Smoking History: Patient has never smoked.  Patient Navigation: Patient education provided. Access to services provided for patient through [facility] program. Spanish interpreter provided.   Breast and Cervical Cancer Risk Assessment: Patient has no family history of breast cancer, known genetic mutations, or radiation treatment to the chest before age 31. Patient has no history of cervical dysplasia, immunocompromised, or DES exposure in-utero.  Risk Assessment    Risk Scores      03/22/2018 12/21/2017   Last edited by: Lynnell Dike, LPN Stoney Bang H, LPN   5-year risk: 0.7 % 0.7 %   Lifetime risk: 6.4 % 6.5 %         Used Spanish interpreter Natale Lay from [place].

## 2018-03-22 NOTE — Patient Instructions (Addendum)
Patient did not need a Pap smear today due to last Pap smear was in September 2017 per patient. Let her know BCCCP will cover Pap smears every 3 years unless has a history of abnormal Pap smears. Referred patient to the [facility] for a right breast diagnostic mammogram and ultrasound.Appointment scheduled for Thursday,September 5, 2019at 1120. Christy Hernandez verbalized understanding.  [place], Kathaleen Maser, RN 9:27 AM

## 2018-03-26 ENCOUNTER — Other Ambulatory Visit: Payer: Self-pay | Admitting: Obstetrics and Gynecology

## 2018-03-30 ENCOUNTER — Other Ambulatory Visit (HOSPITAL_COMMUNITY): Payer: Self-pay | Admitting: Obstetrics and Gynecology

## 2018-03-30 ENCOUNTER — Ambulatory Visit
Admission: RE | Admit: 2018-03-30 | Discharge: 2018-03-30 | Disposition: A | Discharge status: Home / Self Care | Payer: No Typology Code available for payment source | Source: Ambulatory Visit | Attending: Obstetrics and Gynecology | Admitting: Obstetrics and Gynecology

## 2018-03-30 ENCOUNTER — Other Ambulatory Visit (HOSPITAL_COMMUNITY)
Admission: RE | Admit: 2018-03-30 | Discharge: 2018-03-30 | Disposition: A | Discharge status: Home / Self Care | Payer: No Typology Code available for payment source | Attending: Body Imaging | Admitting: Body Imaging

## 2018-03-30 DIAGNOSIS — N6310 Unspecified lump in the right breast, unspecified quadrant: Secondary | ICD-10-CM

## 2018-04-04 ENCOUNTER — Ambulatory Visit
Admission: RE | Admit: 2018-04-04 | Discharge: 2018-04-04 | Disposition: A | Discharge status: Home / Self Care | Payer: No Typology Code available for payment source | Source: Ambulatory Visit | Attending: Obstetrics and Gynecology | Admitting: Obstetrics and Gynecology

## 2018-04-04 ENCOUNTER — Other Ambulatory Visit (HOSPITAL_COMMUNITY): Payer: Self-pay | Admitting: Obstetrics and Gynecology

## 2018-04-04 DIAGNOSIS — N6310 Unspecified lump in the right breast, unspecified quadrant: Secondary | ICD-10-CM

## 2018-04-07 LAB — AEROBIC/ANAEROBIC CULTURE W GRAM STAIN (SURGICAL/DEEP WOUND)

## 2018-04-07 LAB — AEROBIC/ANAEROBIC CULTURE (SURGICAL/DEEP WOUND)

## 2018-04-09 ENCOUNTER — Ambulatory Visit: Payer: Self-pay | Admitting: Surgery

## 2018-04-09 ENCOUNTER — Encounter (HOSPITAL_COMMUNITY): Payer: Self-pay | Admitting: *Deleted

## 2018-04-09 LAB — AEROBIC/ANAEROBIC CULTURE (SURGICAL/DEEP WOUND)

## 2018-04-09 LAB — AEROBIC/ANAEROBIC CULTURE W GRAM STAIN (SURGICAL/DEEP WOUND)
Culture: NO GROWTH
Special Requests: NORMAL

## 2018-04-09 NOTE — H&P (View-Only) (Signed)
History of Present Illness Christy Hernandez. Christy Weare MD; 04/09/2018 12:09 PM) The patient is a 47 year old female who presents with a breast abscess. 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 This was treated with aspiration and antibiotics. She had some chronic scarring causing some retraction of her right nipple. She had a normal mammogram in June of this year. About a month later, she began having some swelling and a palpable mass in the medial right breast. The skin over this area became thickened and erythematous. This was very tender. She also developed a smaller mildly tender mass in the lower outer quadrant of her right breast. This was also erythematous. She has been on 2 different courses of antibiotics. She has had 2 separate ultrasounds and aspirations. These improved the tenderness somewhat but the swelling has returned. She is now referred for surgical evaluation.   CLINICAL DATA: Screening.  EXAM: DIGITAL SCREENING BILATERAL MAMMOGRAM WITH TOMO AND CAD  COMPARISON: Previous exam(s).  ACR Breast Density Category b: There are scattered areas of fibroglandular density.  FINDINGS: There are no findings suspicious for malignancy. Images were processed with CAD.  IMPRESSION: No mammographic evidence of malignancy. A result letter of this screening mammogram will be mailed directly to the patient.  RECOMMENDATION: Screening mammogram in one year. (Code:[id number])  BI-RADS CATEGORY 1: Negative.   Electronically Signed By: Bary Richard M.D. On: 12/21/2017 16:10  Study Result  CLINICAL DATA: 47 year old patient seen in the [facility] clinic today for evaluation of recent symptoms in the right breast. The patient has developed erythema involving the areola and periareolar skin of the medial right breast and a palpable lump in the inferior right breast, separate from the  skin changes. She describes some mild tenderness. She has not had any nipple discharge or drainage from the skin. She denies fever or skin warmth. She reports chronic right nipple retraction the developed during/after a period of right breast infection/inflammation, which for which she was evaluated and treated in 2015 at the [facility], and seen in surgical clinic by Dr. Donell Beers. An aspiration at that time in 2015 did not yield any organisms. A biopsy of the right breast showed benign inflamed tissue with mixed acute and chronic inflammation and abscess formation. The patient states that she has had chronic skin changes of the upper right breast, in the 11 o'clock region since 2015. She had a negative screening mammogram in June 2019. A Spanish interpreter was present during the patient's visit today. EXAM: DIGITAL DIAGNOSTIC RIGHT MAMMOGRAM WITH CAD AND TOMO ULTRASOUND RIGHT BREAST COMPARISON: Previous exam(s). ACR Breast Density Category b: There are scattered areas of fibroglandular density. FINDINGS: Marked skin thickening of the medial areola and periareolar skin, new since her recent screening mammogram of June 2019. Right nipple retraction noted. New area of added density and trabecular thickening in the central/inferior retroareolar right breast. No suspicious microcalcifications. Mammographic images were processed with CAD. On physical exam, there is swelling/thickening of the medial half of the right areola, with erythema and some thickening of the skin of the medial periareolar right breast. The patient demonstrates to me a palpable lump in the inferior right breast. In the 6 to 7 o'clock region of the right breast I do palpate a focal area of thickening. The overlying skin in the region of the palpable lump is normal in appearance. There is mildly erythematous and somewhat nodular thickening of  an approximately 4 cm area of skin in the 11 o'clock region of the right breast  several cm from the areolar margin. The patient states that these skin changes are residual from her infection/inflammation in 2015. Given this history, this likely reflects skin scarring from prior inflammation. Targeted ultrasound is performed, showing skin thickening of the areolar and periareolar medial right breast. Skin thickening in the 2 o'clock position of the areola measures up to 0.9 cm. There is hyperemia within this thickened skin. At 3 o'clock periareolar in the region skin erythema, skin thickness measures approximately 0.5 cm. In the 4 o'clock position, the areola measures 0.6 cm and demonstrates hyperemia. In the region of palpable fullness and thickening in the inferior right breast are 2 focal areas of altered echotexture of the breast parenchyma that likely reflect inflammatory and/or infectious changes. These could potentially progress into drainable abscess collections. --At 6 o'clock position 4 cm from the nipple corresponding to the palpable lump is a heterogeneously hypoechoic masslike area with angular margins measuring 1.2 x 0.8 x 0.9 cm, with peripheral hyperemia. --At approximately 8 o'clock position 3 cm from the nipple is a heterogeneously hypoechoic mass measuring 1.2 x 0.8 x 0.9 cm, with peripheral hyperemia. These 2 masses in the inferior right breast are separated by approximately 1.8 cm. No additional masses are identified in the right breast. No drainable abscess collection is seen at this time. Ultrasound of the right axilla shows normal lymph nodes. IMPRESSION: Infection versus inflammation of the skin of the medial right areola and medial periareolar right breast, within associated palpable lump in the inferior right breast with 2 focal breast parenchymal masses identified on ultrasound. The patient had a similar clinical picture approximately 4 years ago, and describes residual skin changes in the 11 o'clock position of the right breast and residual chronic  nipple retraction. The possibility of granulomatous mastitis is considered. RECOMMENDATION: A trial of antibiotics is recommended and I wrote the patient a prescription for doxycycline 100 mg b.i.d. for 10 days. She has a follow-up appointment next Friday March 30, 2018 for follow-up physical exam and ultrasound. If at that time there is a drainable abscess collection, this can be performed. Alternatively, ultrasound-guided core needle biopsy could be considered if there is a persistent mass or masses. If biopsy is performed, it is suggested that the possibility of granulomatous mastitis be questioned in the notes to the pathologist. I have discussed the findings and recommendations with the patient. Results were also provided in writing at the conclusion of the visit. If applicable, a reminder letter will be sent to the patient regarding the next appointment. BI-RADS CATEGORY 3: Probably benign. Electronically Signed By: Britta Mccreedy M.D. On: 03/22/2018 11:46  CLINICAL DATA: 47 year old female presenting for follow-up ultrasound of the right breast prior to either aspiration or biopsy.  EXAM: ULTRASOUND OF THE RIGHT BREAST  COMPARISON: Previous exam(s).  FINDINGS: On physical exam, there is a broad area of marked erythema associated with a superficial palpable lump in the upper inner quadrant of the right breast spanning approximately 7 cm. There is more mild erythema in the lower outer quadrant of the right breast.  Ultrasound of the right breast at 2 o'clock, 1 cm from the nipple demonstrates an echogenic fluid collection with mobile internal debris. This measures 4.2 x 1.0 x 3.4 cm. Ultrasound of the right breast at 6 o'clock, 4 cm from the nipple again demonstrates a vague irregular hypoechoic area without definite internal mobile debris measuring 2.1 x 1.6  cm in the radial dimension, previously 1.2 x 0.8 cm.  Ultrasound of the right breast at 8 o'clock, 3 cm from  the nipple demonstrates a hypoechoic irregular mass which appears more organized than on the prior exam, likely related to infection. No mobile debris is seen within this area however.  IMPRESSION: 1. There is a new abscess in the right breast at 2 o'clock.  2. Persistent masses at 6 and 8 o'clock in the right breast, both of which have changed in appearance since the prior study. These are both likely related to infection.  RECOMMENDATION: 1. Ultrasound-guided aspiration is recommended for the right breast abscess at 2 o'clock. This will be performed today and dictated in a separate report.  2. I recommended that the patient stop the current course of antibiotics. She was switched to Augmentin and given a 10 day course. This may be continued beyond 10 days if necessary.  3. Given the worsening appearance of the infection, I deferred biopsy of the 2 areas in the right breast at 6 o'clock and 8 o'clock. Follow-up ultrasound is recommended in 5 days (scheduled 04/04/2018 at 12:45 p.m.). Ultrasound should be performed for the abscess at 2 o'clock, and the 2 indeterminate areas in the right breast at 6 o'clock and 8 o'clock. Necessity for repeat aspiration and/or biopsy of these areas will be determined by the interpreting radiologist.  I have discussed the findings and recommendations with the patient. Results were also provided in writing at the conclusion of the visit. If applicable, a reminder letter will be sent to the patient regarding the next appointment.  BI-RADS CATEGORY 3: Probably benign.   Electronically Signed By: Frederico Hamman M.D. On: 03/30/2018 13:41  CLINICAL DATA: Patient presents for Re aspiration of a persistent collection in the 2 o'clock, periareolar region of the right breast.  EXAM: ULTRASOUND GUIDED RIGHT BREAST CYST ASPIRATION  COMPARISON: Previous exams.  PROCEDURE: Using sterile technique, 1% lidocaine, under direct  ultrasound visualization, needle aspiration of the complex, 2 o'clock periareolar collection was performed. 5 mL of purulence fluid was aspirated. There was residual fluid, which could not be aspirated due to its viscosity. Fluid was again sent for culture and sensitivity.  IMPRESSION: Ultrasound-guided aspiration of a right breast collection no apparent complications.  RECOMMENDATIONS: 1. Surgical consultation. The collection fluid has not demonstrated bacteria on g stain or on culture. This may reflect a granulomatous mastitis. 2. Given the persistence of this collection, biopsy will be performed of its wall to exclude inflammatory carcinoma.   Electronically Signed By: Amie Portland M.D. On: 04/04/2018 14:42  ADDENDUM: Pathology revealed DENSE INFLAMMATION, CONSISTENT WITH ABSCESS of RIGHT breast 2 o'clock, 1cmfn, wall . This was found to be concordant by Dr. Amie Portland.  Pathology revealed BENIGN BREAST PARENCHYMA WITH DENSE INFLAMMATION of RIGHT breast, 8 o'clock, 3cmfn . This was found to be concordant by Dr. Amie Portland.  Pathology results were discussed with the patient by Durene Cal, [employer] [facility], and myself by telephone. The patient reported doing well after the biopsy with tenderness at the site. Post biopsy instructions and care were reviewed and questions were answered. The patient was encouraged to call [facility] for any additional concerns.  Surgical consultation has been arranged with Dr. Manus Rudd at [facility] on April 09, 2018.  Pathology results reported by Phylliss Bob, RN on 04/05/2018.   Electronically Signed By: Amie Portland M.D. On: 04/05/2018 13:11   Past Surgical History (April Staton, [place]; 04/09/2018  9:09 AM) Cesarean Section - 1  Diagnostic Studies History (April Staton, CMA; 04/09/2018 9:09 AM) Colonoscopy never Mammogram within last year Pap Smear 1-5  years ago  Allergies (April Staton, CMA; 04/09/2018 9:10 AM) No Known Drug Allergies [04/25/2014]:  Medication History (April Staton, CMA; 04/09/2018 9:10 AM) Augmentin (Oral) Specific strength unknown - Active. Medications Reconciled  Social History (April Staton, CMA; 04/09/2018 9:09 AM) Alcohol use Occasional alcohol use. Caffeine use Carbonated beverages, Coffee. No drug use Tobacco use Never smoker.  [place]; 04/09/2018 9:09 AM) Hypertension Brother, Father, Mother, Sister.  Pregnancy / Birth History ([place]; 04/09/2018 9:09 AM) Age at menarche 13 years. Age of menopause 3346-50 Gravida 6 Irregular periods Length (months) of breastfeeding 3-6 Maternal age 48-25 Para 5  Other Problems (April Staton, CMA; 04/09/2018 9:09 AM) Back Pain High blood pressure     Review of Systems (April Staton CMA; 04/09/2018 9:09 AM) General Present- Night Sweats. Not Present- Appetite Loss, Chills, Fatigue, Fever, Weight Gain and Weight Loss. Skin Not Present- Change in Wart/Mole, Dryness, Hives, Jaundice, New Lesions, Non-Healing Wounds, Rash and Ulcer. HEENT Not Present- Earache, Hearing Loss, Hoarseness, Nose Bleed, Oral Ulcers, Ringing in the Ears, Seasonal Allergies, Sinus Pain, Sore Throat, Visual Disturbances, Wears glasses/contact lenses and Yellow Eyes. Respiratory Not Present- Bloody sputum, Chronic Cough, Difficulty Breathing, Snoring and Wheezing. Cardiovascular Not Present- Chest Pain, Difficulty Breathing Lying Down, Leg Cramps, Palpitations, Rapid Heart Rate, Shortness of Breath and Swelling of Extremities. Gastrointestinal Not Present- Abdominal Pain, Bloating, Bloody Stool, Change in Bowel Habits, Chronic diarrhea, Constipation, Difficulty Swallowing, Excessive gas, Gets full quickly at meals, Hemorrhoids, Indigestion, Nausea, Rectal Pain and Vomiting. Female Genitourinary Not Present- Frequency, Nocturia, Painful Urination,  Pelvic Pain and Urgency. Musculoskeletal Not Present- Back Pain, Joint Pain, Joint Stiffness, Muscle Pain, Muscle Weakness and Swelling of Extremities. Neurological Not Present- Decreased Memory, Fainting, Headaches, Numbness, Seizures, Tingling, Tremor, Trouble walking and Weakness. Psychiatric Not Present- Anxiety, Bipolar, Change in Sleep Pattern, Depression, Fearful and Frequent crying. Endocrine Present- Hot flashes. Not Present- Cold Intolerance, Excessive Hunger, Hair Changes, Heat Intolerance and New Diabetes. Hematology Not Present- Blood Thinners, Easy Bruising, Excessive bleeding, Gland problems, HIV and Persistent Infections.  Vitals (April Staton CMA; 04/09/2018 9:11 AM) 04/09/2018 9:10 AM Weight: 144.5 lb Height: 61in Body Surface Area: 1.65 m Body Mass Index: 27.3 kg/m  Temp.: 97.56F(Oral)  Pulse: 78 (Regular)  BP: 120/89 (Sitting, Left Arm, Standard)      Physical Exam [facility](Christy Peragine K. Maryem Shuffler MD; 04/09/2018 12:10 PM)  The physical exam findings are as follows: Note:Well-developed well-nourished no apparent distress Right breast shows a scarred retracted nipple. At 8:00 there is some mild erythema. There is a palpable 2 cm subcutaneous mass in this area. This is mildly tender.  At 2 to 3:00 just lateral to the nipple there is a large 4 cm palpable mass with some dense erythema. This is very tender to palpation. No drainage from the nipple.  Left breast shows no palpable masses    Assessment & Plan [facility]ard(Christy Natividad K. Nirel Babler MD; 04/09/2018 9:23 AM)  BREAST ABSCESS OF FEMALE (N61.1) Impression: 3:00 - 4 cm abscess 8:00 - 2 cm abscess  Current Plans Schedule for Surgery - Incision and drainage of two right breast abscesses. The surgical procedure has been discussed with the patient. Potential risks, benefits, alternative treatments, and expected outcomes have been explained. All of the patient's questions at this time have been answered. The likelihood of reaching  the patient's treatment goal is good.  The patient understand the proposed surgical procedure and wishes to proceed.  Christy Hernandez. Corliss Skains, MD, [facility]  General/ Trauma Surgery Beeper [phone]  04/09/2018 12:10 PM

## 2018-04-09 NOTE — Progress Notes (Signed)
Preop phone call completed using [employer] ID # [id number]. Denies chest pain or shortness of breath.

## 2018-04-09 NOTE — H&P (Signed)
History of Present Illness Christy Hernandez. Christy Wenker MD; 04/09/2018 12:09 PM) The patient is a 47 year old female who presents with a breast abscess. 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 This was treated with aspiration and antibiotics. She had some chronic scarring causing some retraction of her right nipple. She had a normal mammogram in June of this year. About a month later, she began having some swelling and a palpable mass in the medial right breast. The skin over this area became thickened and erythematous. This was very tender. She also developed a smaller mildly tender mass in the lower outer quadrant of her right breast. This was also erythematous. She has been on 2 different courses of antibiotics. She has had 2 separate ultrasounds and aspirations. These improved the tenderness somewhat but the swelling has returned. She is now referred for surgical evaluation.   CLINICAL DATA: Screening.  EXAM: DIGITAL SCREENING BILATERAL MAMMOGRAM WITH TOMO AND CAD  COMPARISON: Previous exam(s).  ACR Breast Density Category b: There are scattered areas of fibroglandular density.  FINDINGS: There are no findings suspicious for malignancy. Images were processed with CAD.  IMPRESSION: No mammographic evidence of malignancy. A result letter of this screening mammogram will be mailed directly to the patient.  RECOMMENDATION: Screening mammogram in one year. (Code:[id number])  BI-RADS CATEGORY 1: Negative.   Electronically Signed By: Christy Hernandez M.D. On: 12/21/2017 16:10  Study Result  CLINICAL DATA: 47 year old patient seen in the [facility] clinic today for evaluation of recent symptoms in the right breast. The patient has developed erythema involving the areola and periareolar skin of the medial right breast and a palpable lump in the inferior right breast, separate from the  skin changes. She describes some mild tenderness. She has not had any nipple discharge or drainage from the skin. She denies fever or skin warmth. She reports chronic right nipple retraction the developed during/after a period of right breast infection/inflammation, which for which she was evaluated and treated in 2015 at the [facility], and seen in surgical clinic by Dr. Donell Beers. An aspiration at that time in 2015 did not yield any organisms. A biopsy of the right breast showed benign inflamed tissue with mixed acute and chronic inflammation and abscess formation. The patient states that she has had chronic skin changes of the upper right breast, in the 11 o'clock region since 2015. She had a negative screening mammogram in June 2019. A Spanish interpreter was present during the patient's visit today. EXAM: DIGITAL DIAGNOSTIC RIGHT MAMMOGRAM WITH CAD AND TOMO ULTRASOUND RIGHT BREAST COMPARISON: Previous exam(s). ACR Breast Density Category b: There are scattered areas of fibroglandular density. FINDINGS: Marked skin thickening of the medial areola and periareolar skin, new since her recent screening mammogram of June 2019. Right nipple retraction noted. New area of added density and trabecular thickening in the central/inferior retroareolar right breast. No suspicious microcalcifications. Mammographic images were processed with CAD. On physical exam, there is swelling/thickening of the medial half of the right areola, with erythema and some thickening of the skin of the medial periareolar right breast. The patient demonstrates to me a palpable lump in the inferior right breast. In the 6 to 7 o'clock region of the right breast I do palpate a focal area of thickening. The overlying skin in the region of the palpable lump is normal in appearance. There is mildly erythematous and somewhat nodular thickening of  an approximately 4 cm area of skin in the 11 o'clock region of the right breast  several cm from the areolar margin. The patient states that these skin changes are residual from her infection/inflammation in 2015. Given this history, this likely reflects skin scarring from prior inflammation. Targeted ultrasound is performed, showing skin thickening of the areolar and periareolar medial right breast. Skin thickening in the 2 o'clock position of the areola measures up to 0.9 cm. There is hyperemia within this thickened skin. At 3 o'clock periareolar in the region skin erythema, skin thickness measures approximately 0.5 cm. In the 4 o'clock position, the areola measures 0.6 cm and demonstrates hyperemia. In the region of palpable fullness and thickening in the inferior right breast are 2 focal areas of altered echotexture of the breast parenchyma that likely reflect inflammatory and/or infectious changes. These could potentially progress into drainable abscess collections. --At 6 o'clock position 4 cm from the nipple corresponding to the palpable lump is a heterogeneously hypoechoic masslike area with angular margins measuring 1.2 x 0.8 x 0.9 cm, with peripheral hyperemia. --At approximately 8 o'clock position 3 cm from the nipple is a heterogeneously hypoechoic mass measuring 1.2 x 0.8 x 0.9 cm, with peripheral hyperemia. These 2 masses in the inferior right breast are separated by approximately 1.8 cm. No additional masses are identified in the right breast. No drainable abscess collection is seen at this time. Ultrasound of the right axilla shows normal lymph nodes. IMPRESSION: Infection versus inflammation of the skin of the medial right areola and medial periareolar right breast, within associated palpable lump in the inferior right breast with 2 focal breast parenchymal masses identified on ultrasound. The patient had a similar clinical picture approximately 4 years ago, and describes residual skin changes in the 11 o'clock position of the right breast and residual chronic  nipple retraction. The possibility of granulomatous mastitis is considered. RECOMMENDATION: A trial of antibiotics is recommended and I wrote the patient a prescription for doxycycline 100 mg b.i.d. for 10 days. She has a follow-up appointment next Friday March 30, 2018 for follow-up physical exam and ultrasound. If at that time there is a drainable abscess collection, this can be performed. Alternatively, ultrasound-guided core needle biopsy could be considered if there is a persistent mass or masses. If biopsy is performed, it is suggested that the possibility of granulomatous mastitis be questioned in the notes to the pathologist. I have discussed the findings and recommendations with the patient. Results were also provided in writing at the conclusion of the visit. If applicable, a reminder letter will be sent to the patient regarding the next appointment. BI-RADS CATEGORY 3: Probably benign. Electronically Signed By: Britta Mccreedy M.D. On: 03/22/2018 11:46  CLINICAL DATA: 47 year old female presenting for follow-up ultrasound of the right breast prior to either aspiration or biopsy.  EXAM: ULTRASOUND OF THE RIGHT BREAST  COMPARISON: Previous exam(s).  FINDINGS: On physical exam, there is a broad area of marked erythema associated with a superficial palpable lump in the upper inner quadrant of the right breast spanning approximately 7 cm. There is more mild erythema in the lower outer quadrant of the right breast.  Ultrasound of the right breast at 2 o'clock, 1 cm from the nipple demonstrates an echogenic fluid collection with mobile internal debris. This measures 4.2 x 1.0 x 3.4 cm. Ultrasound of the right breast at 6 o'clock, 4 cm from the nipple again demonstrates a vague irregular hypoechoic area without definite internal mobile debris measuring 2.1 x 1.6  cm in the radial dimension, previously 1.2 x 0.8 cm.  Ultrasound of the right breast at 8 o'clock, 3 cm from  the nipple demonstrates a hypoechoic irregular mass which appears more organized than on the prior exam, likely related to infection. No mobile debris is seen within this area however.  IMPRESSION: 1. There is a new abscess in the right breast at 2 o'clock.  2. Persistent masses at 6 and 8 o'clock in the right breast, both of which have changed in appearance since the prior study. These are both likely related to infection.  RECOMMENDATION: 1. Ultrasound-guided aspiration is recommended for the right breast abscess at 2 o'clock. This will be performed today and dictated in a separate report.  2. I recommended that the patient stop the current course of antibiotics. She was switched to Augmentin and given a 10 day course. This may be continued beyond 10 days if necessary.  3. Given the worsening appearance of the infection, I deferred biopsy of the 2 areas in the right breast at 6 o'clock and 8 o'clock. Follow-up ultrasound is recommended in 5 days (scheduled 04/04/2018 at 12:45 p.m.). Ultrasound should be performed for the abscess at 2 o'clock, and the 2 indeterminate areas in the right breast at 6 o'clock and 8 o'clock. Necessity for repeat aspiration and/or biopsy of these areas will be determined by the interpreting radiologist.  I have discussed the findings and recommendations with the patient. Results were also provided in writing at the conclusion of the visit. If applicable, a reminder letter will be sent to the patient regarding the next appointment.  BI-RADS CATEGORY 3: Probably benign.   Electronically Signed By: Frederico Hamman M.D. On: 03/30/2018 13:41  CLINICAL DATA: Patient presents for Re aspiration of a persistent collection in the 2 o'clock, periareolar region of the right breast.  EXAM: ULTRASOUND GUIDED RIGHT BREAST CYST ASPIRATION  COMPARISON: Previous exams.  PROCEDURE: Using sterile technique, 1% lidocaine, under direct  ultrasound visualization, needle aspiration of the complex, 2 o'clock periareolar collection was performed. 5 mL of purulence fluid was aspirated. There was residual fluid, which could not be aspirated due to its viscosity. Fluid was again sent for culture and sensitivity.  IMPRESSION: Ultrasound-guided aspiration of a right breast collection no apparent complications.  RECOMMENDATIONS: 1. Surgical consultation. The collection fluid has not demonstrated bacteria on g stain or on culture. This may reflect a granulomatous mastitis. 2. Given the persistence of this collection, biopsy will be performed of its wall to exclude inflammatory carcinoma.   Electronically Signed By: Amie Portland M.D. On: 04/04/2018 14:42  ADDENDUM: Pathology revealed DENSE INFLAMMATION, CONSISTENT WITH ABSCESS of RIGHT breast 2 o'clock, 1cmfn, wall . This was found to be concordant by Dr. Amie Portland.  Pathology revealed BENIGN BREAST PARENCHYMA WITH DENSE INFLAMMATION of RIGHT breast, 8 o'clock, 3cmfn . This was found to be concordant by Dr. Amie Portland.  Pathology results were discussed with the patient by Durene Cal, [employer], and myself by telephone. The patient reported doing well after the biopsy with tenderness at the site. Post biopsy instructions and care were reviewed and questions were answered. The patient was encouraged to call [facility] for any additional concerns.  Surgical consultation has been arranged with Dr. Manus Rudd at [facility] on April 09, 2018.  Pathology results reported by Phylliss Bob, RN on 04/05/2018.   Electronically Signed By: Amie Portland M.D. On: 04/05/2018 13:11   Past Surgical History (April Staton, [place]; 04/09/2018  9:09 AM) Cesarean Section - 1  Diagnostic Studies History (April Staton, CMA; 04/09/2018 9:09 AM) Colonoscopy never Mammogram within last year Pap Smear 1-5  years ago  Allergies (April Staton, CMA; 04/09/2018 9:10 AM) No Known Drug Allergies [04/25/2014]:  Medication History (April Staton, CMA; 04/09/2018 9:10 AM) Augmentin (Oral) Specific strength unknown - Active. Medications Reconciled  Social History (April Staton, CMA; 04/09/2018 9:09 AM) Alcohol use Occasional alcohol use. Caffeine use Carbonated beverages, Coffee. No drug use Tobacco use Never smoker.  [place]; 04/09/2018 9:09 AM) Hypertension Brother, Father, Mother, Sister.  Pregnancy / Birth History ([place]; 04/09/2018 9:09 AM) Age at menarche 13 years. Age of menopause 3346-50 Gravida 6 Irregular periods Length (months) of breastfeeding 3-6 Maternal age 48-25 Para 5  Other Problems (April Staton, CMA; 04/09/2018 9:09 AM) Back Pain High blood pressure     Review of Systems (April Staton CMA; 04/09/2018 9:09 AM) General Present- Night Sweats. Not Present- Appetite Loss, Chills, Fatigue, Fever, Weight Gain and Weight Loss. Skin Not Present- Change in Wart/Mole, Dryness, Hives, Jaundice, New Lesions, Non-Healing Wounds, Rash and Ulcer. HEENT Not Present- Earache, Hearing Loss, Hoarseness, Nose Bleed, Oral Ulcers, Ringing in the Ears, Seasonal Allergies, Sinus Pain, Sore Throat, Visual Disturbances, Wears glasses/contact lenses and Yellow Eyes. Respiratory Not Present- Bloody sputum, Chronic Cough, Difficulty Breathing, Snoring and Wheezing. Cardiovascular Not Present- Chest Pain, Difficulty Breathing Lying Down, Leg Cramps, Palpitations, Rapid Heart Rate, Shortness of Breath and Swelling of Extremities. Gastrointestinal Not Present- Abdominal Pain, Bloating, Bloody Stool, Change in Bowel Habits, Chronic diarrhea, Constipation, Difficulty Swallowing, Excessive gas, Gets full quickly at meals, Hemorrhoids, Indigestion, Nausea, Rectal Pain and Vomiting. Female Genitourinary Not Present- Frequency, Nocturia, Painful Urination,  Pelvic Pain and Urgency. Musculoskeletal Not Present- Back Pain, Joint Pain, Joint Stiffness, Muscle Pain, Muscle Weakness and Swelling of Extremities. Neurological Not Present- Decreased Memory, Fainting, Headaches, Numbness, Seizures, Tingling, Tremor, Trouble walking and Weakness. Psychiatric Not Present- Anxiety, Bipolar, Change in Sleep Pattern, Depression, Fearful and Frequent crying. Endocrine Present- Hot flashes. Not Present- Cold Intolerance, Excessive Hunger, Hair Changes, Heat Intolerance and New Diabetes. Hematology Not Present- Blood Thinners, Easy Bruising, Excessive bleeding, Gland problems, HIV and Persistent Infections.  Vitals (April Staton CMA; 04/09/2018 9:11 AM) 04/09/2018 9:10 AM Weight: 144.5 lb Height: 61in Body Surface Area: 1.65 m Body Mass Index: 27.3 kg/m  Temp.: 97.56F(Oral)  Pulse: 78 (Regular)  BP: 120/89 (Sitting, Left Arm, Standard)      Physical Exam Molli Hazard(Jeda Pardue K. Money Mckeithan MD; 04/09/2018 12:10 PM)  The physical exam findings are as follows: Note:Well-developed well-nourished no apparent distress Right breast shows a scarred retracted nipple. At 8:00 there is some mild erythema. There is a palpable 2 cm subcutaneous mass in this area. This is mildly tender.  At 2 to 3:00 just lateral to the nipple there is a large 4 cm palpable mass with some dense erythema. This is very tender to palpation. No drainage from the nipple.  Left breast shows no palpable masses    Assessment & Plan Molli Hazard(Advaith Lamarque K. Maxxon Schwanke MD; 04/09/2018 9:23 AM)  BREAST ABSCESS OF FEMALE (N61.1) Impression: 3:00 - 4 cm abscess 8:00 - 2 cm abscess  Current Plans Schedule for Surgery - Incision and drainage of two right breast abscesses. The surgical procedure has been discussed with the patient. Potential risks, benefits, alternative treatments, and expected outcomes have been explained. All of the patient's questions at this time have been answered. The likelihood of reaching  the patient's treatment goal is good.  The patient understand the proposed surgical procedure and wishes to proceed.  Christy Hernandez. Corliss Skains, MD, [facility]  General/ Trauma Surgery Beeper [phone]  04/09/2018 12:10 PM

## 2018-04-10 ENCOUNTER — Encounter (HOSPITAL_COMMUNITY): Payer: Self-pay | Admitting: Surgery

## 2018-04-10 ENCOUNTER — Other Ambulatory Visit: Payer: Self-pay

## 2018-04-10 ENCOUNTER — Encounter (HOSPITAL_COMMUNITY)
Admission: RE | Disposition: A | Discharge status: Home / Self Care | Payer: Self-pay | Source: Ambulatory Visit | Attending: Surgery

## 2018-04-10 ENCOUNTER — Ambulatory Visit (HOSPITAL_COMMUNITY): Payer: Self-pay | Admitting: Anesthesiology

## 2018-04-10 ENCOUNTER — Ambulatory Visit (HOSPITAL_COMMUNITY)
Admission: RE | Admit: 2018-04-10 | Discharge: 2018-04-10 | Disposition: A | Discharge status: Home / Self Care | Payer: Self-pay | Source: Ambulatory Visit | Attending: Surgery | Admitting: Surgery

## 2018-04-10 DIAGNOSIS — I10 Essential (primary) hypertension: Secondary | ICD-10-CM | POA: Insufficient documentation

## 2018-04-10 DIAGNOSIS — N611 Abscess of the breast and nipple: Secondary | ICD-10-CM | POA: Insufficient documentation

## 2018-04-10 DIAGNOSIS — Z79899 Other long term (current) drug therapy: Secondary | ICD-10-CM | POA: Insufficient documentation

## 2018-04-10 HISTORY — PX: INCISION AND DRAINAGE ABSCESS: SHX5864

## 2018-04-10 LAB — CBC
HCT: 37.9 % (ref 36.0–46.0)
Hemoglobin: 12.1 g/dL (ref 12.0–15.0)
MCH: 28.3 pg (ref 26.0–34.0)
MCHC: 31.9 g/dL (ref 30.0–36.0)
MCV: 88.6 fL (ref 78.0–100.0)
PLATELETS: 338 10*3/uL (ref 150–400)
RBC: 4.28 MIL/uL (ref 3.87–5.11)
RDW: 13.1 % (ref 11.5–15.5)
WBC: 7.6 10*3/uL (ref 4.0–10.5)

## 2018-04-10 LAB — BASIC METABOLIC PANEL
Anion gap: 9 (ref 5–15)
BUN: 10 mg/dL (ref 6–20)
CALCIUM: 8.9 mg/dL (ref 8.9–10.3)
CO2: 26 mmol/L (ref 22–32)
CREATININE: 0.52 mg/dL (ref 0.44–1.00)
Chloride: 105 mmol/L (ref 98–111)
GFR calc non Af Amer: >60 mL/min (ref >60)
GLUCOSE: 91 mg/dL (ref 70–99)
Potassium: 3.7 mmol/L (ref 3.5–5.1)
Sodium: 140 mmol/L (ref 135–145)

## 2018-04-10 LAB — POCT PREGNANCY, URINE: Preg Test, Ur: NEGATIVE

## 2018-04-10 SURGERY — INCISION AND DRAINAGE, ABSCESS
Anesthesia: General | Site: Breast | Laterality: Right

## 2018-04-10 MED ORDER — CHLORHEXIDINE GLUCONATE CLOTH 2 % EX PADS: 6.0000 | MEDICATED_PAD | Freq: Once | CUTANEOUS | Status: DC

## 2018-04-10 MED ORDER — ONDANSETRON HCL 4 MG/2ML IJ SOLN
INTRAMUSCULAR | Status: DC | PRN
  Administered 2018-04-10: 4 mg via INTRAVENOUS

## 2018-04-10 MED ORDER — FENTANYL CITRATE (PF) 250 MCG/5ML IJ SOLN
INTRAMUSCULAR | Status: DC | PRN
  Administered 2018-04-10 (×2): 50 ug via INTRAVENOUS

## 2018-04-10 MED ORDER — PROPOFOL 10 MG/ML IV BOLUS
INTRAVENOUS | Status: AC
  Filled 2018-04-10: qty 20

## 2018-04-10 MED ORDER — GABAPENTIN 300 MG PO CAPS
300.0000 mg | ORAL_CAPSULE | ORAL | Status: AC
  Administered 2018-04-10: 300 mg via ORAL
  Filled 2018-04-10: qty 1

## 2018-04-10 MED ORDER — MIDAZOLAM HCL 2 MG/2ML IJ SOLN
INTRAMUSCULAR | Status: AC
  Filled 2018-04-10: qty 2

## 2018-04-10 MED ORDER — BUPIVACAINE-EPINEPHRINE (PF) 0.25% -1:200000 IJ SOLN
INTRAMUSCULAR | Status: AC
  Filled 2018-04-10: qty 30

## 2018-04-10 MED ORDER — ACETAMINOPHEN 500 MG PO TABS
1000.0000 mg | ORAL_TABLET | ORAL | Status: AC
  Administered 2018-04-10: 1000 mg via ORAL
  Filled 2018-04-10: qty 2

## 2018-04-10 MED ORDER — CEFAZOLIN SODIUM-DEXTROSE 2-4 GM/100ML-% IV SOLN
2.0000 g | INTRAVENOUS | Status: AC
  Administered 2018-04-10: 2 g via INTRAVENOUS
  Filled 2018-04-10: qty 100

## 2018-04-10 MED ORDER — FENTANYL CITRATE (PF) 250 MCG/5ML IJ SOLN
INTRAMUSCULAR | Status: AC
  Filled 2018-04-10: qty 5

## 2018-04-10 MED ORDER — LACTATED RINGERS IV SOLN
INTRAVENOUS | Status: DC
  Administered 2018-04-10: 11:00:00 via INTRAVENOUS

## 2018-04-10 MED ORDER — DEXAMETHASONE SODIUM PHOSPHATE 10 MG/ML IJ SOLN
INTRAMUSCULAR | Status: DC | PRN
  Administered 2018-04-10: 8 mg via INTRAVENOUS

## 2018-04-10 MED ORDER — 0.9 % SODIUM CHLORIDE (POUR BTL) OPTIME
TOPICAL | Status: DC | PRN
  Administered 2018-04-10: 1000 mL

## 2018-04-10 MED ORDER — PROPOFOL 10 MG/ML IV BOLUS
INTRAVENOUS | Status: DC | PRN
  Administered 2018-04-10: 200 mg via INTRAVENOUS

## 2018-04-10 MED ORDER — OXYCODONE HCL 5 MG PO TABS: 5.0000 mg | ORAL_TABLET | Freq: Four times a day (QID) | ORAL | 0 refills | Status: DC | PRN

## 2018-04-10 MED ORDER — LIDOCAINE 2% (20 MG/ML) 5 ML SYRINGE
INTRAMUSCULAR | Status: DC | PRN
  Administered 2018-04-10: 100 mg via INTRAVENOUS

## 2018-04-10 MED ORDER — BUPIVACAINE-EPINEPHRINE (PF) 0.25% -1:200000 IJ SOLN
INTRAMUSCULAR | Status: DC | PRN
  Administered 2018-04-10: 20 mL

## 2018-04-10 MED ORDER — MIDAZOLAM HCL 2 MG/2ML IJ SOLN
INTRAMUSCULAR | Status: DC | PRN
  Administered 2018-04-10: 2 mg via INTRAVENOUS

## 2018-04-10 SURGICAL SUPPLY — 34 items
BLADE CLIPPER SURG (BLADE) IMPLANT
BNDG GAUZE ELAST 4 BULKY (GAUZE/BANDAGES/DRESSINGS) IMPLANT
CANISTER SUCT 3000ML PPV (MISCELLANEOUS) ×3 IMPLANT
CHLORAPREP W/TINT 26ML (MISCELLANEOUS) ×2 IMPLANT
COVER SURGICAL LIGHT HANDLE (MISCELLANEOUS) ×3 IMPLANT
DRAIN PENROSE 1/2X12 LTX STRL (WOUND CARE) ×2 IMPLANT
DRAPE LAPAROSCOPIC ABDOMINAL (DRAPES) ×2 IMPLANT
DRAPE LAPAROTOMY 100X72 PEDS (DRAPES) IMPLANT
DRSG PAD ABDOMINAL 8X10 ST (GAUZE/BANDAGES/DRESSINGS) ×2 IMPLANT
ELECT CAUTERY BLADE 6.4 (BLADE) ×3 IMPLANT
ELECT REM PT RETURN 9FT ADLT (ELECTROSURGICAL) ×3
ELECTRODE REM PT RTRN 9FT ADLT (ELECTROSURGICAL) ×1 IMPLANT
GAUZE SPONGE 4X4 12PLY STRL (GAUZE/BANDAGES/DRESSINGS) ×2 IMPLANT
GLOVE BIO SURGEON STRL SZ7 (GLOVE) ×3 IMPLANT
GLOVE BIOGEL PI IND STRL 7.5 (GLOVE) ×1 IMPLANT
GLOVE BIOGEL PI INDICATOR 7.5 (GLOVE) ×2
GOWN STRL REUS W/ TWL LRG LVL3 (GOWN DISPOSABLE) ×2 IMPLANT
GOWN STRL REUS W/TWL LRG LVL3 (GOWN DISPOSABLE) ×6
KIT BASIN OR (CUSTOM PROCEDURE TRAY) ×3 IMPLANT
KIT TURNOVER KIT B (KITS) ×3 IMPLANT
NS IRRIG 1000ML POUR BTL (IV SOLUTION) ×3 IMPLANT
PACK GENERAL/GYN (CUSTOM PROCEDURE TRAY) ×3 IMPLANT
PAD ARMBOARD 7.5X6 YLW CONV (MISCELLANEOUS) ×3 IMPLANT
PENCIL SMOKE EVAC W/HOLSTER (ELECTROSURGICAL) ×2 IMPLANT
SUT ETHILON 2 0 FS 18 (SUTURE) ×2 IMPLANT
SUT MNCRL AB 4-0 PS2 18 (SUTURE) ×2 IMPLANT
SUT VIC AB 2-0 SH 27 (SUTURE) ×3
SUT VIC AB 2-0 SH 27XBRD (SUTURE) IMPLANT
SUT VIC AB 3-0 SH 27 (SUTURE) ×3
SUT VIC AB 3-0 SH 27X BRD (SUTURE) IMPLANT
SWAB COLLECTION DEVICE MRSA (MISCELLANEOUS) ×2 IMPLANT
SWAB CULTURE ESWAB REG 1ML (MISCELLANEOUS) ×2 IMPLANT
TOWEL OR 17X24 6PK STRL BLUE (TOWEL DISPOSABLE) ×3 IMPLANT
TOWEL OR 17X26 10 PK STRL BLUE (TOWEL DISPOSABLE) ×3 IMPLANT

## 2018-04-10 NOTE — Op Note (Signed)
Preop diagnosis: Right breast abscesses x2 Postop diagnosis: Same Procedure performed: Incision and drainage with sharp debridement (scalpel) of 2 right breast abscesses Surgeon:Sharde Gover K Chandni Gagan Anesthesia: General via LMA Indications:This is a 47 year old female who is 4 years status post right breast abscess. This was treated with aspiration and antibiotics. She had some chronic scarring causing some retraction of her right nipple. She had a normal mammogram in June of this year. About a month later, she began having some swelling and a palpable mass in the medial right breast. The skin over this area became thickened and erythematous. This was very tender. She also developed a smaller mildly tender mass in the lower outer quadrant of her right breast. This was also erythematous. She has been on 2 different courses of antibiotics. She has had 2 separate ultrasounds and aspirations. These improved the tenderness somewhat but the swelling has returned.  Operative findings: There is a small breast abscess measuring 2 cm in the lower outer quadrant of the right breast at 8:00.  There is a larger 3 cm abscess just adjacent to the nipple in the upper inner quadrant between 2 and 3:00.  Description of procedure: The patient is brought to the operating room and placed in the supine position on the operating room table.  After an adequate level of general anesthesia was obtained, her right breast was prepped with ChloraPrep and draped in sterile fashion.  A timeout was taken to ensure the proper patient and proper procedure.  The patient has an obvious large fluctuant area medially at the edge of the nipple.  In the right lower outer quadrant there is some erythema as well as a 2 and half centimeter area of thickening.  We began in the lower outer quadrant.  We made a curvilinear incision at the edge of the area Ola directly over the mass.  We dissected down to an abscess cavity.  We cultured the  drainage and sent this to microbiology.  I excised the wall of the abscess back to healthy-appearing breast tissue.  Cautery was used for hemostasis.  We irrigated the wound thoroughly and inspected for hemostasis.  We then turned our attention to the fluctuant area medially.  I made a curvilinear incision at the edge of the areole.  We dissected down into the abscess cavity and encountered some purulent fluid.  I excised the wall of the abscess with cautery and scalpel.  We irrigated thoroughly and inspected for hemostasis.  We then cut 2 separate lengths of half inch Penrose drain and inserted into each abscess cavity.  This was secured with 2-0 nylon sutures.  Interrupted 2-0 nylon sutures were used to close each incision loosely.  Both incisions were infiltrated with 0.25% Marcaine with epinephrine.  Dry dressings were applied.  Patient was then extubated and brought to the recovery room in stable condition.  All sponge, instrument, and needle counts are correct.  Wilmon ArmsMatthew K. Corliss Skainssuei, MD, [facility]  General/ Trauma Surgery Beeper [phone]  04/10/2018 12:42 PM

## 2018-04-10 NOTE — Transfer of Care (Signed)
Immediate Anesthesia Transfer of Care Note  Patient: Christy Hernandez  Procedure(s) Performed: INCISION AND DRAINAGE OF 2 RIGHT BREAST ABSCESSES X2 (Right Breast)  Patient Location: PACU  Anesthesia Type:General  Level of Consciousness: awake  Airway & Oxygen Therapy: Patient Spontanous Breathing and Patient connected to nasal cannula oxygen  Post-op Assessment: Report given to RN and Post -op Vital signs reviewed and stable  Post vital signs: Reviewed and stable  Last Vitals:  Vitals Value Taken Time  BP    Temp    Pulse 106 04/10/2018 12:48 PM  Resp    SpO2 100 % 04/10/2018 12:48 PM  Vitals shown include unvalidated device data.  Last Pain:  Vitals:   04/10/18 1055  TempSrc:   PainSc: 0-No pain      Patients Stated Pain Goal: 3 (04/10/18 1055)  Complications: No apparent anesthesia complications

## 2018-04-10 NOTE — Anesthesia Procedure Notes (Signed)
Procedure Name: LMA Insertion Date/Time: 04/10/2018 11:53 AM Performed by: Elliot DallyHuggins, Malala Trenkamp, CRNA Pre-anesthesia Checklist: Patient identified, Emergency Drugs available, Suction available and Patient being monitored Patient Re-evaluated:Patient Re-evaluated prior to induction Oxygen Delivery Method: Circle System Utilized Preoxygenation: Pre-oxygenation with 100% oxygen Induction Type: IV induction Ventilation: Mask ventilation without difficulty LMA: LMA inserted LMA Size: 4.0 Number of attempts: 1 Airway Equipment and Method: Bite block Placement Confirmation: positive ETCO2 Tube secured with: Tape Dental Injury: Teeth and Oropharynx as per pre-operative assessment

## 2018-04-10 NOTE — Anesthesia Postprocedure Evaluation (Signed)
Anesthesia Post Note  Patient: Christy BaltimoreMaria Petra Valadez Hernandez  Procedure(s) Performed: INCISION AND DRAINAGE OF 2 RIGHT BREAST ABSCESSES X2 (Right Breast)     Patient location during evaluation: PACU Anesthesia Type: General Level of consciousness: awake and alert Pain management: pain level controlled Vital Signs Assessment: post-procedure vital signs reviewed and stable Respiratory status: spontaneous breathing, nonlabored ventilation and respiratory function stable Cardiovascular status: blood pressure returned to baseline and stable Postop Assessment: no apparent nausea or vomiting Anesthetic complications: no    Last Vitals:  Vitals:   04/10/18 1306 04/10/18 1321  BP: 122/80 128/86  Pulse: 91 82  Resp: (!) 22 20  Temp:    SpO2: 98% 100%    Last Pain:  Vitals:   04/10/18 1321  TempSrc:   PainSc: 0-No pain                 Beryle Lathehomas E Delmore Sear

## 2018-04-10 NOTE — Anesthesia Preprocedure Evaluation (Addendum)
Anesthesia Evaluation  Patient identified by MRN, date of birth, ID band Patient awake    Reviewed: Allergy & Precautions, NPO status , Patient's Chart, lab work & pertinent test results  History of Anesthesia Complications Negative for: history of anesthetic complications  Airway Mallampati: I  TM Distance: >3 FB Neck ROM: Full    Dental  (+) Dental Advisory Given, Teeth Intact   Pulmonary neg pulmonary ROS,    breath sounds clear to auscultation       Cardiovascular hypertension, Pt. on medications  Rhythm:Regular Rate:Normal     Neuro/Psych  Headaches, negative psych ROS   GI/Hepatic negative GI ROS, Neg liver ROS,   Endo/Other  negative endocrine ROS  Renal/GU negative Renal ROS  negative genitourinary   Musculoskeletal negative musculoskeletal ROS (+)   Abdominal   Peds  Hematology negative hematology ROS (+)   Anesthesia Other Findings   Reproductive/Obstetrics  S/p tubal ligation                            Anesthesia Physical Anesthesia Plan  ASA: II  Anesthesia Plan: General   Post-op Pain Management:    Induction: Intravenous  PONV Risk Score and Plan: 3 and Treatment may vary due to age or medical condition, Ondansetron, Dexamethasone and Midazolam  Airway Management Planned: LMA  Additional Equipment: None  Intra-op Plan:   Post-operative Plan: Extubation in OR  Informed Consent: I have reviewed the patients History and Physical, chart, labs and discussed the procedure including the risks, benefits and alternatives for the proposed anesthesia with the patient or authorized representative who has indicated his/her understanding and acceptance.   Dental advisory given  Plan Discussed with: CRNA and Anesthesiologist  Anesthesia Plan Comments:        Anesthesia Quick Evaluation

## 2018-04-10 NOTE — Interval H&P Note (Signed)
History and Physical Interval Note:  04/10/2018 10:04 AM  Christy Hernandez  has presented today for surgery, with the diagnosis of RIGHT BREAST ABSCESSES X2  The various methods of treatment have been discussed with the patient and family. After consideration of risks, benefits and other options for treatment, the patient has consented to  Procedure(s): INCISION AND DRAINAGE OF 2 RIGHT BREAST ABSCESSES X2 (Right) as a surgical intervention .  The patient's history has been reviewed, patient examined, no change in status, stable for surgery.  I have reviewed the patient's chart and labs.  Questions were answered to the patient's satisfaction.     Wynona LunaMatthew K Saavi Mceachron

## 2018-04-10 NOTE — Discharge Instructions (Signed)
You have 2 drains in place in your right breast.  You need to keep this area dressed with gauze and tape.  This needs to be changed at least twice a day.  If you need to take a shower, tape plastic wrap over the dressing to try to keep the dressing dry.  Take a quick shower and then change the dressing after your shower.  For the first several days, there will be a lot of drainage coming through the rubber drains.  This should slow down over the next week or 2.  You may take the pain medicine as needed for pain in your right breast.  If you no longer need the stronger pain medicine, you may use Tylenol or ibuprofen.  You may eat a regular diet.  If you are no longer taking the pain medicine, you may drive.  Call [facility] at [phone] for any serious problems or questions.

## 2018-04-11 ENCOUNTER — Encounter (HOSPITAL_COMMUNITY): Payer: Self-pay | Admitting: Surgery

## 2018-04-15 LAB — AEROBIC/ANAEROBIC CULTURE W GRAM STAIN (SURGICAL/DEEP WOUND): Culture: NO GROWTH

## 2018-09-10 ENCOUNTER — Ambulatory Visit: Payer: Self-pay | Admitting: Family Medicine

## 2018-09-10 ENCOUNTER — Encounter: Payer: Self-pay | Admitting: Family Medicine

## 2018-09-10 VITALS — BP 118/78 | HR 72 | Temp 97.8°F | Resp 12 | Ht 63.0 in | Wt 144.4 lb

## 2018-09-10 DIAGNOSIS — I10 Essential (primary) hypertension: Secondary | ICD-10-CM

## 2018-09-10 LAB — BASIC METABOLIC PANEL
BUN / CREAT RATIO: 19 (ref 9–23)
BUN: 11 mg/dL (ref 6–24)
CHLORIDE: 105 mmol/L (ref 96–106)
CO2: 23 mmol/L (ref 20–29)
CREATININE: 0.57 mg/dL (ref 0.57–1.00)
Calcium: 8.9 mg/dL (ref 8.7–10.2)
GFR calc Af Amer: 127 mL/min/{1.73_m2} (ref >59)
GFR calc non Af Amer: 110 mL/min/{1.73_m2} (ref >59)
GLUCOSE: 89 mg/dL (ref 65–99)
Potassium: 4 mmol/L (ref 3.5–5.2)
Sodium: 141 mmol/L (ref 134–144)

## 2018-09-10 MED ORDER — LISINOPRIL 10 MG PO TABS: 10.0000 mg | ORAL_TABLET | Freq: Every day | ORAL | 2 refills | Status: DC

## 2018-09-10 NOTE — Progress Notes (Signed)
Subjective:    Patient ID: Christy Hernandez, female    DOB: 09/17/1970, 48 y.o.   MRN: [id number]  HPI Christy Hernandez is a 48 y.o. female Presents today for: Chief Complaint  Patient presents with  . Hypertension    patient is her for blood work and refills she do not want a physical. Need a refill on lisinopril   Hypertension: BP Readings from Last 3 Encounters:  09/10/18 118/78  04/10/18 136/83  03/22/18 124/72   Lab Results  Component Value Date   CREATININE 0.52 04/10/2018   takes lisinopril 53m QD.   Lab Results  Component Value Date   CHOL 192 03/08/2018   HDL 37 (L) 03/08/2018   LDLCALC 133 (H) 03/08/2018   TRIG 108 03/08/2018   CHOLHDL 5.2 (H) 03/08/2018   The 10-year ASCVD risk score (Mikey BussingDC Jr., et al., 2013) is: 1.9%   Values used to calculate the score:     Age: [age]years     Sex: Female     Is Non-Hispanic African American: No     Diabetic: No     Tobacco smoker: No     Systolic Blood Pressure: 1886mmHg     Is BP treated: Yes     HDL Cholesterol: 37 mg/dL     Total Cholesterol: 192 mg/dL   Health maintenance: Pap in 03/2016. Declined flu vaccine.  Immunization History  Administered Date(s) Administered  . Hepatitis A 04/15/2009, 06/04/2009  . Hepatitis B 04/15/2009, 06/04/2009  . MMR 05/20/2006  . Td 01/09/2006  . Tdap 08/27/2015     Review of Systems  Constitutional: Negative for fatigue and unexpected weight change.  Respiratory: Negative for chest tightness and shortness of breath.   Cardiovascular: Negative for chest pain, palpitations and leg swelling.  Gastrointestinal: Negative for abdominal pain and blood in stool.  Neurological: Negative for dizziness, syncope, light-headedness and headaches.   Patient Active Problem List   Diagnosis Date Noted  . Flu-like symptoms 09/19/2017  . Nonintractable headache 09/19/2017  . Fever 09/19/2017  . Generalized muscle ache 09/19/2017   Past Medical History:    Diagnosis Date  . Anemia   . Hypertension    Past Surgical History:  Procedure Laterality Date  . CESAREAN SECTION    . INCISION AND DRAINAGE ABSCESS Right 04/10/2018   Procedure: INCISION AND DRAINAGE OF 2 RIGHT BREAST ABSCESSES X2;  Surgeon: TDonnie Mesa MD;  [place] [facility]  Service: General;  Laterality: Right;  . TUBAL LIGATION Bilateral 11/26/2015   Procedure: POST PARTUM TUBAL LIGATION;  Surgeon: PMora Bellman MD;  [facility];  Service: Gynecology;  Laterality: Bilateral;   No Known Allergies Prior to Admission medications   Medication Sig Start Date End Date Taking? Authorizing Provider  lisinopril (PRINIVIL,ZESTRIL) 10 MG tablet Take 1 tablet (10 mg total) by mouth daily. 09/10/18  Yes GWendie Agreste MD   Social History   Socioeconomic History  . Marital status: Married    Spouse name: Not on file  . Number of children: Not on file  . Years of education: Not on file  . Highest education level: Not on file  Occupational History  . Not on file  Social Needs  . Financial resource strain: Not on file  . Food insecurity:    Worry: Not on file    Inability: Not on file  . Transportation needs:    Medical: Not on file    Non-medical: Not on file  Tobacco  Use  . Smoking status: Never Smoker  . Smokeless tobacco: Never Used  Substance and Sexual Activity  . Alcohol use: No  . Drug use: No  . Sexual activity: Yes    Birth control/protection: Surgical  Lifestyle  . Physical activity:    Days per week: Not on file    Minutes per session: Not on file  . Stress: Not on file  Relationships  . Social connections:    Talks on phone: Not on file    Gets together: Not on file    Attends religious service: Not on file    Active member of club or organization: Not on file    Attends meetings of clubs or organizations: Not on file    Relationship status: Not on file  . Intimate partner violence:    Fear of current or ex partner: Not on file    Emotionally  abused: Not on file    Physically abused: Not on file    Forced sexual activity: Not on file  Other Topics Concern  . Not on file  Social History Narrative  . Not on file       Objective:   Physical Exam Vitals signs reviewed.  Constitutional:      Appearance: She is well-developed.  HENT:     Head: Normocephalic and atraumatic.  Eyes:     Conjunctiva/sclera: Conjunctivae normal.     Pupils: Pupils are equal, round, and reactive to light.  Neck:     Vascular: No carotid bruit.  Cardiovascular:     Rate and Rhythm: Normal rate and regular rhythm.     Heart sounds: Normal heart sounds.  Pulmonary:     Effort: Pulmonary effort is normal.     Breath sounds: Normal breath sounds.  Abdominal:     Palpations: Abdomen is soft. There is no pulsatile mass.     Tenderness: There is no abdominal tenderness.  Skin:    General: Skin is warm and dry.  Neurological:     Mental Status: She is alert and oriented to person, place, and time.  Psychiatric:        Behavior: Behavior normal.    Vitals:   09/10/18 0822  BP: 118/78  Pulse: 72  Resp: 12  Temp: 97.8 F (36.6 C)  TempSrc: Oral  SpO2: 99%  Weight: 144 lb 6.4 oz (65.5 kg)  Height: '5\' 3"'  (1.6 m)      Assessment & Plan:   Christy Hernandez is a 48 y.o. female Essential hypertension - Plan: lisinopril (PRINIVIL,ZESTRIL) 10 MG tablet, Basic metabolic panel, CANCELED: Comprehensive metabolic panel  - Stable, tolerating current regimen. Medications refilled. Labs pending as above.   -Previous lipids mildly elevated but overall low ASCVD risk.  Diet and exercise discussed.  -Plan for recheck in approximately 9 months, consider repeat lipid testing at that time, as well as Pap testing.  -declined flu vaccine.   Meds ordered this encounter  Medications  . lisinopril (PRINIVIL,ZESTRIL) 10 MG tablet    Sig: Take 1 tablet (10 mg total) by mouth daily.    Dispense:  90 tablet    Refill:  2   Patient Instructions        If you have lab work done today you will be contacted with your lab results within the next 2 weeks.  If you have not heard from [place] then please contact us. The fastest way to get your results is to register for My Chart.   IF  you received an x-ray today, you will receive an invoice from [facility]. Please contact [facility] at [phone] with questions or concerns regarding your invoice.   IF you received labwork today, you will receive an invoice from [facility]. Please contact [facility] at [phone] with questions or concerns regarding your invoice.   Our billing staff will not be able to assist you with questions regarding bills from these companies.  You will be contacted with the lab results as soon as they are available. The fastest way to get your results is to activate your My Chart account. Instructions are located on the last page of this paperwork. If you have not heard from [place] regarding the results in 2 weeks, please contact this office.        Signed,   Merri Ray, MD [facility].  09/10/18 8:42 AM

## 2018-09-10 NOTE — Patient Instructions (Signed)
° ° ° °  If you have lab work done today you will be contacted with your lab results within the next 2 weeks.  If you have not heard from us then please contact us. The fastest way to get your results is to register for My Chart. ° ° °IF you received an x-ray today, you will receive an invoice from [facility]. Please contact [facility] at [phone] with questions or concerns regarding your invoice.  ° °IF you received labwork today, you will receive an invoice from [facility]. Please contact [facility] at [phone] with questions or concerns regarding your invoice.  ° °Our billing staff will not be able to assist you with questions regarding bills from these companies. ° °You will be contacted with the lab results as soon as they are available. The fastest way to get your results is to activate your My Chart account. Instructions are located on the last page of this paperwork. If you have not heard from us regarding the results in 2 weeks, please contact this office. °  ° ° ° °

## 2018-11-08 ENCOUNTER — Other Ambulatory Visit: Payer: Self-pay | Admitting: Obstetrics and Gynecology

## 2018-11-08 DIAGNOSIS — Z1231 Encounter for screening mammogram for malignant neoplasm of breast: Secondary | ICD-10-CM

## 2019-03-13 ENCOUNTER — Other Ambulatory Visit (HOSPITAL_COMMUNITY): Payer: Self-pay | Admitting: *Deleted

## 2019-03-13 DIAGNOSIS — Z1231 Encounter for screening mammogram for malignant neoplasm of breast: Secondary | ICD-10-CM

## 2019-05-14 ENCOUNTER — Encounter (HOSPITAL_COMMUNITY): Payer: Self-pay

## 2019-05-14 ENCOUNTER — Other Ambulatory Visit: Payer: Self-pay

## 2019-05-14 ENCOUNTER — Ambulatory Visit (HOSPITAL_COMMUNITY)
Admission: RE | Admit: 2019-05-14 | Discharge: 2019-05-14 | Disposition: A | Discharge status: Home / Self Care | Payer: Medicaid Other | Source: Ambulatory Visit | Attending: Obstetrics and Gynecology | Admitting: Obstetrics and Gynecology

## 2019-05-14 DIAGNOSIS — Z01419 Encounter for gynecological examination (general) (routine) without abnormal findings: Secondary | ICD-10-CM

## 2019-05-14 NOTE — Progress Notes (Signed)
No complaints today.   Pap Smear: Pap smear completed today. Last Pap smear was in September 2017 at the [facility] and normal per patient. Per patient has no history of an abnormal Pap smear. No Pap smear results are in EPIC.  Physical exam: Breasts Breasts symmetrical.Two scars observed left breast at 9 o'clock from history of breast infection per patient. Three scars observed right breast at 12 o'clock, 2 o'clock, and inner breast next to areola from history of breast infections per patient. No nipple retraction bilateral breasts. No nipple discharge bilateral breasts. No lymphadenopathy. No lumps palpated bilateral breasts. No complaints of pain or tenderness on exam. Referred patient to the [facility] for a screening mammogram. Appointment scheduled for Wednesday, May 15, 2019 at 1300.        Pelvic/Bimanual   Ext Genitalia No lesions, no swelling and no discharge observed on external genitalia.         Vagina Vagina pink and normal texture. No lesions or discharge observed in vagina.          Cervix Cervix is present. Cervix pink and of normal texture. No discharge observed.     Uterus Uterus is present and palpable. Uterus in normal position and normal size.        Adnexae Bilateral ovaries present and palpable. No tenderness on palpation.         Rectovaginal No rectal exam completed today since patient had no rectal complaints. No skin abnormalities observed on exam.    Smoking History: Patient has never smoked.  Patient Navigation: Patient education provided. Access to services provided for patient through [facility] program. Spanish interpreter provided.   Breast and Cervical Cancer Risk Assessment: Patient has no family history of breast cancer, known genetic mutations, or radiation treatment to the chest before age 29. Patient has no history of cervical dysplasia, immunocompromised, or DES exposure in-utero.  Risk Assessment    Risk Scores      05/14/2019 03/22/2018   Last edited by: Loletta Parish, RN Armond Hang, LPN   5-year risk: 0.7 % 0.7 %   Lifetime risk: 6.2 % 6.4 %         Used Spanish interpreter Rudene Anda from [place].

## 2019-05-14 NOTE — Patient Instructions (Signed)
Explained breast self awareness with [name]. Let patient know [facility] will cover Pap smears and HPV typing every 5 years unless has a history of abnormal Pap smears. Referred patient to the [facility] for a screening mammogram. Appointment scheduled for Wednesday, May 15, 2019 at 1300. Patient aware of appointment and will be there. Let patient know will follow up with her within the next couple weeks with results with results of Pap smear by letter or phone. Informed patient that the [facility] will [facility]-up with her within the next couple of weeks with results of her mammogram by letter or phone. [name] verbalized understanding.  Anh Bigos, Arvil Chaco, RN 3:20 PM

## 2019-05-15 ENCOUNTER — Ambulatory Visit
Admission: RE | Admit: 2019-05-15 | Discharge: 2019-05-15 | Disposition: A | Discharge status: Home / Self Care | Payer: No Typology Code available for payment source | Source: Ambulatory Visit | Attending: Obstetrics and Gynecology | Admitting: Obstetrics and Gynecology

## 2019-05-15 DIAGNOSIS — Z1231 Encounter for screening mammogram for malignant neoplasm of breast: Secondary | ICD-10-CM

## 2019-05-20 LAB — CYTOLOGY - PAP
Comment: NEGATIVE
Diagnosis: NEGATIVE
High risk HPV: NEGATIVE

## 2019-06-03 ENCOUNTER — Telehealth (HOSPITAL_COMMUNITY): Payer: Self-pay | Admitting: *Deleted

## 2019-06-03 NOTE — Telephone Encounter (Signed)
Normal Pap smear result letter mailed to patient 05/29/2019. 

## 2019-06-10 ENCOUNTER — Other Ambulatory Visit: Payer: Self-pay

## 2019-06-10 ENCOUNTER — Ambulatory Visit (INDEPENDENT_AMBULATORY_CARE_PROVIDER_SITE_OTHER): Payer: Self-pay | Admitting: Family Medicine

## 2019-06-10 ENCOUNTER — Encounter: Payer: Self-pay | Admitting: Family Medicine

## 2019-06-10 VITALS — BP 133/75 | HR 70 | Temp 98.7°F | Wt 145.2 lb

## 2019-06-10 DIAGNOSIS — E785 Hyperlipidemia, unspecified: Secondary | ICD-10-CM

## 2019-06-10 DIAGNOSIS — I10 Essential (primary) hypertension: Secondary | ICD-10-CM

## 2019-06-10 MED ORDER — LISINOPRIL 10 MG PO TABS: 10.0000 mg | ORAL_TABLET | Freq: Every day | ORAL | 2 refills | Status: DC

## 2019-06-10 NOTE — Progress Notes (Signed)
Subjective:  Patient ID: Christy Hernandez, female    DOB: 01-14-1971  Age: 48 y.o. MRN: [id number]  CC:  Chief Complaint  Patient presents with   Hypertension    9 month f/u and refill on lisinopril. Already had pap smear done on 05/15/19 at [facility]. When went to have her pap her bp was high and they told me to inform the dr at my next appt    HPI Wynelle Dreier presents for  Spanish spoken and dtr translating.   Hypertension: Currently on lisinopril 10 mg daily. No new side effects.  Elevated BP at well woman exam October 27 Home readings:none.   BP Readings from Last 3 Encounters:  06/10/19 133/75  05/14/19 (!) 142/99  09/10/18 118/78   Lab Results  Component Value Date   CREATININE 0.57 09/10/2018   Lab Results  Component Value Date   CHOL 192 03/08/2018   HDL 37 (L) 03/08/2018   LDLCALC 133 (H) 03/08/2018   TRIG 108 03/08/2018   CHOLHDL 5.2 (H) 03/08/2018  The 10-year ASCVD risk score Mikey Bussing DC Jr., et al., 2013) is: 2.6%   Values used to calculate the score:     Age: 33 years     Sex: Female     Is Non-Hispanic African American: No     Diabetic: No     Tobacco smoker: No     Systolic Blood Pressure: 409 mmHg     Is BP treated: Yes     HDL Cholesterol: 37 mg/dL     Total Cholesterol: 192 mg/dL Not on meds.   Declines flu vaccine.   History Patient Active Problem List   Diagnosis Date Noted   Well woman exam with routine gynecological exam 05/14/2019   Flu-like symptoms 09/19/2017   Nonintractable headache 09/19/2017   Fever 09/19/2017   Generalized muscle ache 09/19/2017   Past Medical History:  Diagnosis Date   Anemia    Hypertension    Past Surgical History:  Procedure Laterality Date   CESAREAN SECTION     INCISION AND DRAINAGE ABSCESS Right 04/10/2018   Procedure: INCISION AND DRAINAGE OF 2 RIGHT BREAST ABSCESSES X2;  Surgeon: Donnie Mesa, MD;  Location: [place];  Service: General;  Laterality:  Right;   TUBAL LIGATION Bilateral 11/26/2015   Procedure: POST PARTUM TUBAL LIGATION;  Surgeon: Mora Bellman, MD;  Location: [facility];  Service: Gynecology;  Laterality: Bilateral;   No Known Allergies Prior to Admission medications   Medication Sig Start Date End Date Taking? Authorizing Provider  lisinopril (PRINIVIL,ZESTRIL) 10 MG tablet Take 1 tablet (10 mg total) by mouth daily. 09/10/18  Yes Wendie Agreste, MD   Social History   Socioeconomic History   Marital status: Married    Spouse name: Not on file   Number of children: Not on file   Years of education: Not on file   Highest education level: Not on file  Occupational History   Not on file  Social Needs   Financial resource strain: Not on file   Food insecurity    Worry: Not on file    Inability: Not on file   Transportation needs    Medical: No    Non-medical: No  Tobacco Use   Smoking status: Never Smoker   Smokeless tobacco: Never Used  Substance and Sexual Activity   Alcohol use: No   Drug use: No   Sexual activity: Yes    Birth control/protection: Surgical  Lifestyle  Physical activity    Days per week: Not on file    Minutes per session: Not on file   Stress: Not on file  Relationships   Social connections    Talks on phone: Not on file    Gets together: Not on file    Attends religious service: Not on file    Active member of club or organization: Not on file    Attends meetings of clubs or organizations: Not on file    Relationship status: Not on file   Intimate partner violence    Fear of current or ex partner: Not on file    Emotionally abused: Not on file    Physically abused: Not on file    Forced sexual activity: Not on file  Other Topics Concern   Not on file  Social History Narrative   Not on file    Review of Systems  Constitutional: Negative for fatigue and unexpected weight change.  Respiratory: Negative for chest tightness and shortness of breath.     Cardiovascular: Negative for chest pain, palpitations and leg swelling.  Gastrointestinal: Negative for abdominal pain and blood in stool.  Neurological: Negative for dizziness, syncope, light-headedness and headaches.     Objective:   Vitals:   06/10/19 1029  BP: 133/75  Pulse: 70  Temp: 98.7 F (37.1 C)  TempSrc: Oral  SpO2: 100%  Weight: 145 lb 3.2 oz (65.9 kg)     Physical Exam Vitals signs reviewed.  Constitutional:      Appearance: She is well-developed.  HENT:     Head: Normocephalic and atraumatic.  Eyes:     Conjunctiva/sclera: Conjunctivae normal.     Pupils: Pupils are equal, round, and reactive to light.  Neck:     Vascular: No carotid bruit.  Cardiovascular:     Rate and Rhythm: Normal rate and regular rhythm.     Heart sounds: Normal heart sounds.  Pulmonary:     Effort: Pulmonary effort is normal.     Breath sounds: Normal breath sounds.  Abdominal:     Palpations: Abdomen is soft. There is no pulsatile mass.     Tenderness: There is no abdominal tenderness.  Skin:    General: Skin is warm and dry.  Neurological:     Mental Status: She is alert and oriented to person, place, and time.  Psychiatric:        Behavior: Behavior normal.    Assessment & Plan:  Kammie Scioli is a 48 y.o. female . Hyperlipidemia, unspecified hyperlipidemia type - Plan: Lipid Panel, Comprehensive metabolic panel  - mild elevation prior with low ASCVD risk score. Repeat testing.   Essential hypertension - Plan: lisinopril (ZESTRIL) 10 MG tablet  - controlled in office. No changes, home monitoring and rtc precautions given.     No orders of the defined types were placed in this encounter.  Patient Instructions   presion aparece bien hoy. mismo medicina y regrese en 9 meses. Mas temprano si necesario.   Cmo tomarse la presin arterial How to Take Your Blood Pressure La presin arterial es la medida de la fuerza de la sangre al presionar contra las  paredes de las arterias. Las arterias son los vasos sanguneos que transportan la sangre desde el corazn hacia todas las partes del cuerpo. Su mdico toma su presin arterial en cada visita al consultorio. Usted tambin puede tomar su presin arterial en casa con un aparato de medicin de la presin arterial. Es posible que  deba tomar su propia presin arterial:  Para confirmar un diagnstico de presin arterial elevada (hipertensin).  Para controlar su presin arterial a lo largo del tiempo.  Para asegurarse de que el medicamento para la presin arterial est surtiendo Engineer, miningefecto. Materiales necesarios: Para tomar su presin arterial, necesitar un aparato de medicin de la presin arterial. Puede comprar un aparato de medicin de la presin arterial, o tensimetro, en la mayora de las farmacias o en lnea. Existen varios tipos de tensimetros para Cabin crewuso en el hogar. Al escoger uno, considere lo siguiente:  Escoja un tensimetro que tenga un brazalete.  Escoja un brazalete que envuelva ceidamente la parte superior de su brazo. Debe poder meter nicamente un dedo entre el brazalete y Cabin crewel brazo.  No escoja un tensimetro que mida su presin arterial en la mueca o el dedo. Su mdico puede sugerirle un tensimetro confiable que cumpla con sus necesidades. Cmo prepararse Para obtener la lectura ms precisa, evite realizar lo siguiente durante los 30 minutos previos a Chief Operating Officercontrolar su presin arterial:  Beber cafena.  Consumir alcohol.  Comer.  Fumar.  Realizar actividad fsica. Cinco minutos antes de controlar su presin arterial:  Vace la vejiga.  Sintese tranquilo en una silla de comedor y no en un silln blando o sof. Cmo tomarse la presin arterial Para controlar su presin arterial, siga las instrucciones presentes en el manual que se incluye con el tensimetro. Si tiene Ambulance personun tensimetro digital, las instrucciones podran ser las siguientes: 1. Sintese con la espalda  recta. 2. Coloque los pies en el piso. No cruce los tobillos o las piernas. 3. Apoye su brazo izquierdo al nivel de su corazn en una mesa o escritorio, o en el brazo de la silla. 4. Arremnguese. 5. Envuelva la parte superior de su brazo izquierdo, 1 pulgada (2,5 cm) sobre su codo, con el brazalete. Es mejor Optometristenvolver el brazalete alrededor de la piel Warfielddesnuda. 6. Ajuste el brazalete alrededor de su brazo. Debe poder meter nicamente un dedo entre el brazalete y Cabin crewel brazo. 7. Coloque el cable dentro del surco de su codo. 8. Presione el botn de encendido. 9. Permanezca sentado tranquilamente mientras el brazalete se infla y se desinfla. 10. Lea la lectura digital que aparece en la pantalla del tensimetro y antela (regstrela). 11. Espere de 2a3 minutos, y luego repita los pasos desde el paso 1. Qu significa mi lectura de presin arterial? Una lectura de la presin arterial consta de un nmero ms alto sobre un nmero ms bajo. En condiciones ideales, la presin arterial debe estar por debajo de 120/80. El primer nmero ("superior") es la presin sistlica. Es la medida de la presin de las arterias cuando el corazn late. El segundo nmero ("inferior") es la presin diastlica. Es la medida de la presin en las arterias cuando el corazn se relaja. La presin arterial se clasifica en cuatro etapas. Las siguientes son las etapas para adultos que no tienen enfermedad grave de corto plazo o una afeccin crnica. La presin sistlica y la presin diastlica se miden en una unidad llamada mm Hg. Normal  Presin sistlica: por debajo de 120.  Presin diastlica: por debajo de 80. Elevada  Presin sistlica: 120-129.  Presin diastlica: por debajo de 80. Etapa 1 de hipertensin  Presin sistlica: 130-139.  Presin diastlica: 80-89. Etapa 2 de hipertensin  Presin sistlica: 140 o ms.  Presin diastlica: 90 o ms. Puede tener prehipertensin o hipertensin incluso si nicamente el  nmero sistlico o el diastlico de su lectura es ms  elevado que lo normal. Siga estas indicaciones en su casa:  Mida su presin arterial con la frecuencia recomendada por su mdico.  Lleve el tensimetro a su prxima cita con el mdico para asegurarse de lo siguiente: ? Que lo [place] correctamente. ? Que genera lecturas precisas.  Asegrese de entender cules son sus objetivos para la presin arterial.  Dgale a su mdico si tiene efectos secundarios causados por los medicamentos para la presin arterial. Comunquese con un mdico si:  Su presin arterial es sistemticamente alta. Solicite ayuda de inmediato si:  Su presin arterial sistlica est por encima de 180.  Su presin arterial diastlica est por encima de 110. Esta informacin no tiene Theme park manager el consejo del mdico. Asegrese de hacerle al mdico cualquier pregunta que tenga. Document Released: 06/15/2016 Document Revised: 10/14/2017 Document Reviewed: 12/11/2015 Elsevier Patient Education  2020 Elsevier Inc.   Cmo controlar su hipertensin Managing Your Hypertension La hipertensin se denomina usualmente presin arterial alta. Ocurre cuando la sangre presiona contra las paredes de las arterias con demasiada fuerza. Las arterias son los vasos sanguneos que transportan la sangre desde el corazn hacia todas las partes del cuerpo. La hipertensin hace que el corazn haga ms esfuerzo para Insurance account manager y [employer] que las arterias se Armed forces training and education officer o Multimedia programmer. La hipertensin no tratada o no controlada puede causar infarto de miocardio, accidente cerebrovascular, enfermedad renal y otros problemas. Qu son las Merchandiser, retail de presin arterial? Una lectura de la presin arterial consiste de un nmero ms alto sobre un nmero ms bajo. En condiciones ideales, la presin arterial debe estar por debajo de 120/80. El primer nmero ("superior") es la presin sistlica. Es la medida de la presin de las arterias cuando el  corazn late. El segundo nmero ("inferior") es la presin diastlica. Es la medida de la presin en las arterias cuando el corazn se relaja. Qu significa mi lectura de presin arterial? La presin arterial se clasifica en cuatro etapas. Sobre la base de la lectura de su presin arterial, el mdico puede usar las siguientes etapas para determinar si necesita tratamiento y de qu tipo. La presin sistlica y la presin diastlica se miden en una unidad llamada mm Hg. Normal  Presin sistlica: por debajo de 120.  Presin diastlica: por debajo de 80. Elevada  Presin sistlica: 120-129.  Presin diastlica: por debajo de 80. Etapa 1 de hipertensin  Presin sistlica: 130-139.  Presin diastlica: 80-89. Etapa 2 de hipertensin  Presin sistlica: 140 o ms.  Presin diastlica: 90 o ms. Cules son los riesgos para la salud asociados con la hipertensin? Controlar la hipertensin es una responsabilidad importante. La hipertensin no controlada puede causar:  Infarto de miocardio.  Accidente cerebrovascular.  Debilitamiento de los vasos sanguneos (aneurisma).  Insuficiencia cardaca.  Dao renal.  Dao ocular.  Sndrome metablico.  Problemas de memoria y concentracin. Qu cambios puedo hacer para controlar mi hipertensin? La hipertensin se puede controlar haciendo [employer] estilo de vida y, posiblemente, tomando medicamentos. Su mdico le ayudar a crear un plan para bajar la presin arterial al rango normal. Comida y bebida   Siga una dieta con alto contenido de fibras y Joanna, y con bajo contenido de sal (sodio), azcar agregada y Rosalin Hawking. Un ejemplo de plan alimenticio es la dieta DASH (Dietary Approaches to Stop Hypertension, Mtodos alimenticios para detener la hipertensin). Para alimentarse de esta manera: ? Coma mucha fruta y verdura fresca. Trate de que la mitad del plato de cada comida sea de frutas y  verduras. ? Coma cereales integrales, como  pasta integral, arroz integral y pan integral. Llene aproximadamente un cuarto del plato con cereales integrales. ? Consuma productos lcteos con bajo contenido de grasa. ? Evite la ingesta de cortes de carne grasa, carne procesada o curada, y carne de ave con piel. Llene aproximadamente un cuarto del plato con protenas magras, como pescado, pollo sin piel, frijoles, huevos y tofu. ? Evite ingerir alimentos prehechos o procesados. En general, estos tienen mayor cantidad de sodio, azcar agregada y Steffanie [place].  Reduzca su ingesta diaria de sodio. La mayora de las personas que tienen hipertensin deben comer menos de 1500 mg de sodio por [employer].  Limite el consumo de alcohol a no ms de 1 medida por da si es mujer y no est Orthoptist y a 2 medidas por da si es hombre. Una medida equivale a 12onzas de cerveza, 5onzas de vino o 1onzas de bebidas alcohlicas de alta graduacin. Estilo de vida  Trabaje con su mdico para mantener un peso saludable o Curator. Pregntele cual es su peso recomendado.  Realice al menos 30 minutos de ejercicio que haga que se acelere su corazn (ejercicio Magazine features editor) la [employer] de la [place]. Estas actividades pueden incluir caminar, nadar o andar en bicicleta.  Incluya ejercicios para fortalecer sus msculos (ejercicios de resistencia), como levantamiento de pesas, como parte de su rutina semanal de ejercicios. Intente realizar de este tipo de ejercicios al Kellogg a la [place].  No consuma ningn producto que contenga nicotina o tabaco, como cigarrillos y Administrator, Civil Service. Si necesita ayuda para dejar de fumar, consulte al [employer].  Controle las enfermedades a largo plazo (crnicas), como el colesterol alto o la diabetes. Control  Contrlese la presin arterial en su casa segn las indicaciones del mdico. La presin arterial deseada puede variar en funcin de las enfermedades, la edad y otros factores personales.  Contrlese la  presin arterial de manera regular, en la frecuencia indicada por su mdico. Trabaje con su mdico  Revise con su mdico todos los medicamentos que toma ya que puede haber efectos secundarios o interacciones.  Hable con su mdico acerca de la dieta, hbitos de ejercicio y otros factores del estilo de vida que pueden contribuir a la hipertensin.  Consulte a su mdico regularmente. Su mdico puede ayudarle a crear y [place] su plan para controlar la hipertensin. Debo tomar un medicamento para controlar mi presin arterial? El mdico puede recetarle medicamentos si los cambios en el estilo de vida no son suficientes para Museum/gallery curator la presin arterial y si:  Su presin arterial sistlica es de 130 o ms.  Su presin arterial diastlica es de 80 o ms. Tome los medicamentos solamente como se lo haya indicado el mdico. Siga cuidadosamente las indicaciones. Los medicamentos para la presin arterial deben tomarse segn las indicaciones. Los medicamentos pierden eficacia al omitir las dosis. El hecho de omitir las dosis tambin [place] el riesgo de otros problemas. Comunquese con un mdico si:  Piensa que tiene Runner, broadcasting/film/video a los medicamentos que ha tomado.  Tiene dolores de cabeza frecuentes (recurrentes).  Siente mareos.  Tiene hinchazn en los tobillos.  Tiene problemas de visin. Solicite ayuda de inmediato si:  Siente un dolor de cabeza intenso o confusin.  Siente debilidad inusual, adormecimiento o que Hospital doctor.  Siente un dolor intenso en el pecho o el abdomen.  Vomita repetidas veces.  Tiene dificultad para respirar. Resumen  La hipertensin se produce cuando la Sara Lee  bombea en las arterias con mucha fuerza. Si esta afeccin no se controla, podra correr riesgo de tener complicaciones graves.  La presin arterial deseada puede variar en funcin de las enfermedades, la edad y otros factores personales. Para la [employer], una presin  arterial normal es menor que 120/80.  La hipertensin se puede controlar mediante cambios en el estilo de vida, tomando medicamentos, o ambas cosas. Los [employer] Corporation estilo de vida incluyen prdida de peso, ingerir alimentos sanos, seguir una dieta baja en sodio, hacer ms ejercicio y Glass blower/designer consumo de alcohol. Esta informacin no tiene Theme park manager el consejo del mdico. Asegrese de hacerle al mdico cualquier pregunta que tenga. Document Released: 03/28/2012 Document Revised: 09/12/2016 Document Reviewed: 06/15/2016 Elsevier Patient Education  [employer].     If you have lab work done today you will be contacted with your lab results within the next 2 weeks.  If you have not heard from [place] then please contact us. The fastest way to get your results is to register for My Chart.   IF you received an x-ray today, you will receive an invoice from [facility]. Please contact [facility] at [phone] with questions or concerns regarding your invoice.   IF you received labwork today, you will receive an invoice from [facility]. Please contact [facility] at [phone] with questions or concerns regarding your invoice.   Our billing staff will not be able to assist you with questions regarding bills from these companies.  You will be contacted with the lab results as soon as they are available. The fastest way to get your results is to activate your My Chart account. Instructions are located on the last page of this paperwork. If you have not heard from [place] regarding the results in 2 weeks, please contact this office.          Signed, Meredith Staggers, MD [facility]

## 2019-06-10 NOTE — Patient Instructions (Addendum)
presion aparece bien hoy. mismo medicina y regrese en 9 meses. Mas temprano si necesario.   Cmo tomarse la presin arterial How to Take Your Blood Pressure La presin arterial es la medida de la fuerza de la sangre al presionar contra las paredes de las arterias. Las arterias son los vasos sanguneos que transportan la sangre desde el corazn hacia todas las partes del cuerpo. Su mdico toma su presin arterial en cada visita al consultorio. Usted tambin puede tomar su presin arterial en casa con un aparato de medicin de la presin arterial. Es posible que deba tomar su propia presin arterial:  Para confirmar un diagnstico de presin arterial elevada (hipertensin).  Para controlar su presin arterial a lo largo del tiempo.  Para asegurarse de que el medicamento para la presin arterial est surtiendo Engineer, mining. Materiales necesarios: Para tomar su presin arterial, necesitar un aparato de medicin de la presin arterial. Puede comprar un aparato de medicin de la presin arterial, o tensimetro, en la mayora de las farmacias o en lnea. Existen varios tipos de tensimetros para Cabin crew. Al escoger uno, considere lo siguiente:  Escoja un tensimetro que tenga un brazalete.  Escoja un brazalete que envuelva ceidamente la parte superior de su brazo. Debe poder meter nicamente un dedo entre el brazalete y Cabin crew.  No escoja un tensimetro que mida su presin arterial en la mueca o el dedo. Su mdico puede sugerirle un tensimetro confiable que cumpla con sus necesidades. Cmo prepararse Para obtener la lectura ms precisa, evite realizar lo siguiente durante los 30 minutos previos a Chief Operating Officer su presin arterial:  Beber cafena.  Consumir alcohol.  Comer.  Fumar.  Realizar actividad fsica. Cinco minutos antes de controlar su presin arterial:  Vace la vejiga.  Sintese tranquilo en una silla de comedor y no en un silln blando o sof. Cmo tomarse la presin  arterial Para controlar su presin arterial, siga las instrucciones presentes en el manual que se incluye con el tensimetro. Si tiene Ambulance person, las instrucciones podran ser las siguientes: 1. Sintese con la espalda recta. 2. Coloque los pies en el piso. No cruce los tobillos o las piernas. 3. Apoye su brazo izquierdo al nivel de su corazn en una mesa o escritorio, o en el brazo de la silla. 4. Arremnguese. 5. Envuelva la parte superior de su brazo izquierdo, 1 pulgada (2,5 cm) sobre su codo, con el brazalete. Es mejor Optometrist brazalete alrededor de la piel [place]. 6. Ajuste el brazalete alrededor de su brazo. Debe poder meter nicamente un dedo entre el brazalete y Cabin crew. 7. Coloque el cable dentro del surco de su codo. 8. Presione el botn de encendido. 9. Permanezca sentado tranquilamente mientras el brazalete se infla y se desinfla. 10. Lea la lectura digital que aparece en la pantalla del tensimetro y antela (regstrela). 11. Espere de 2a3 minutos, y luego repita los pasos desde el paso 1. Qu significa mi lectura de presin arterial? Una lectura de la presin arterial consta de un nmero ms alto sobre un nmero ms bajo. En condiciones ideales, la presin arterial debe estar por debajo de 120/80. El primer nmero ("superior") es la presin sistlica. Es la medida de la presin de las arterias cuando el corazn late. El segundo nmero ("inferior") es la presin diastlica. Es la medida de la presin en las arterias cuando el corazn se relaja. La presin arterial se clasifica en cuatro etapas. Las siguientes son las etapas para adultos que no tienen  enfermedad grave de corto plazo o una afeccin crnica. La presin sistlica y la presin diastlica se miden en una unidad llamada mm Hg. Normal  Presin sistlica: por debajo de 120.  Presin diastlica: por debajo de 80. Elevada  Presin sistlica: 120-129.  Presin diastlica: por debajo de 80. Etapa 1 de  hipertensin  Presin sistlica: 130-139.  Presin diastlica: 80-89. Etapa 2 de hipertensin  Presin sistlica: 140 o ms.  Presin diastlica: 90 o ms. Puede tener prehipertensin o hipertensin incluso si nicamente el nmero sistlico o el diastlico de su lectura es ms elevado que lo normal. Siga estas indicaciones en su casa:  Mida su presin arterial con la frecuencia recomendada por su mdico.  Lleve el tensimetro a su prxima cita con el mdico para asegurarse de lo siguiente: ? Que lo [place] correctamente. ? Que genera lecturas precisas.  Asegrese de entender cules son sus objetivos para la presin arterial.  Dgale a su mdico si tiene efectos secundarios causados por los medicamentos para la presin arterial. Comunquese con un mdico si:  Su presin arterial es sistemticamente alta. Solicite ayuda de inmediato si:  Su presin arterial sistlica est por encima de 180.  Su presin arterial diastlica est por encima de 110. Esta informacin no tiene Theme park manager el consejo del mdico. Asegrese de hacerle al mdico cualquier pregunta que tenga. Document Released: 06/15/2016 Document Revised: 10/14/2017 Document Reviewed: 12/11/2015 Elsevier Patient Education  2020 Elsevier Inc.   Cmo controlar su hipertensin Managing Your Hypertension La hipertensin se denomina usualmente presin arterial alta. Ocurre cuando la sangre presiona contra las paredes de las arterias con demasiada fuerza. Las arterias son los vasos sanguneos que transportan la sangre desde el corazn hacia todas las partes del cuerpo. La hipertensin hace que el corazn haga ms esfuerzo para Insurance account manager y [employer] que las arterias se Armed forces training and education officer o Multimedia programmer. La hipertensin no tratada o no controlada puede causar infarto de miocardio, accidente cerebrovascular, enfermedad renal y otros problemas. Qu son las Merchandiser, retail de presin arterial? Una lectura de la presin arterial  consiste de un nmero ms alto sobre un nmero ms bajo. En condiciones ideales, la presin arterial debe estar por debajo de 120/80. El primer nmero ("superior") es la presin sistlica. Es la medida de la presin de las arterias cuando el corazn late. El segundo nmero ("inferior") es la presin diastlica. Es la medida de la presin en las arterias cuando el corazn se relaja. Qu significa mi lectura de presin arterial? La presin arterial se clasifica en cuatro etapas. Sobre la base de la lectura de su presin arterial, el mdico puede usar las siguientes etapas para determinar si necesita tratamiento y de qu tipo. La presin sistlica y la presin diastlica se miden en una unidad llamada mm Hg. Normal  Presin sistlica: por debajo de 120.  Presin diastlica: por debajo de 80. Elevada  Presin sistlica: 120-129.  Presin diastlica: por debajo de 80. Etapa 1 de hipertensin  Presin sistlica: 130-139.  Presin diastlica: 80-89. Etapa 2 de hipertensin  Presin sistlica: 140 o ms.  Presin diastlica: 90 o ms. Cules son los riesgos para la salud asociados con la hipertensin? Controlar la hipertensin es una responsabilidad importante. La hipertensin no controlada puede causar:  Infarto de miocardio.  Accidente cerebrovascular.  Debilitamiento de los vasos sanguneos (aneurisma).  Insuficiencia cardaca.  Dao renal.  Dao ocular.  Sndrome metablico.  Problemas de memoria y concentracin. Qu cambios puedo hacer para controlar mi hipertensin? La hipertensin se Magazine features editor  haciendo [employer] de vida y, posiblemente, tomando medicamentos. Su mdico le ayudar a crear un plan para bajar la presin arterial al rango normal. Comida y bebida   Siga una dieta con alto contenido de fibras y [place], y con bajo contenido de sal (sodio), azcar agregada y Daphene Jaeger. Un ejemplo de plan alimenticio es la dieta DASH (Dietary Approaches to Stop  Hypertension, Mtodos alimenticios para detener la hipertensin). Para alimentarse de esta manera: ? Coma mucha fruta y [place]. Trate de que la mitad del plato de cada comida sea de frutas y verduras. ? Coma cereales integrales, como pasta integral, arroz integral y pan integral. Llene aproximadamente un cuarto del plato con cereales integrales. ? Consuma productos lcteos con bajo contenido de grasa. ? Evite la ingesta de cortes de carne grasa, carne procesada o curada, y carne de ave con piel. Llene aproximadamente un cuarto del plato con protenas magras, como pescado, pollo sin piel, frijoles, huevos y tofu. ? Evite ingerir alimentos prehechos o procesados. En general, estos tienen mayor cantidad de sodio, azcar agregada y Wendee Copp.  Reduzca su ingesta diaria de sodio. La mayora de las personas que tienen hipertensin deben comer menos de 1500 mg de sodio por [employer].  Limite el consumo de alcohol a no ms de 1 medida por da si es mujer y no est Music therapist y a 2 medidas por da si es hombre. Una medida equivale a 12onzas de cerveza, 5onzas de vino o 1onzas de bebidas alcohlicas de alta graduacin. Estilo de vida  Trabaje con su mdico para mantener un peso saludable o Administrator, Civil Service. Pregntele cual es su peso recomendado.  Realice al menos 30 minutos de ejercicio que haga que se acelere su corazn (ejercicio Arboriculturist) la [employer] de la [place]. Estas actividades pueden incluir caminar, nadar o andar en bicicleta.  Incluya ejercicios para fortalecer sus msculos (ejercicios de resistencia), como levantamiento de pesas, como parte de su rutina semanal de ejercicios. Intente realizar 83minutos de este tipo de ejercicios al [employer] a la [place].  No consuma ningn producto que contenga nicotina o tabaco, como cigarrillos y Psychologist, sport and exercise. Si necesita ayuda para dejar de fumar, consulte al [employer].  Controle las enfermedades a largo plazo (crnicas), como el  colesterol alto o la diabetes. Control  Contrlese la presin arterial en su casa segn las indicaciones del mdico. La presin arterial deseada puede variar en funcin de las enfermedades, la edad y otros factores personales.  Contrlese la presin arterial de manera regular, en la frecuencia indicada por su mdico. Trabaje con su mdico  Revise con su mdico todos los medicamentos que toma ya que puede haber efectos secundarios o interacciones.  Hable con su mdico acerca de la dieta, hbitos de ejercicio y otros factores del estilo de vida que pueden contribuir a la hipertensin.  Consulte a su mdico regularmente. Su mdico puede ayudarle a crear y [place] su plan para controlar la hipertensin. Debo tomar un medicamento para controlar mi presin arterial? El mdico puede recetarle medicamentos si los cambios en el estilo de vida no son suficientes para Child psychotherapist la presin arterial y si:  Su presin arterial sistlica es de 263 o ms.  Su presin arterial diastlica es de 80 o ms. Tome los medicamentos solamente como se lo haya indicado el mdico. Siga cuidadosamente las indicaciones. Los medicamentos para la presin arterial deben tomarse segn las indicaciones. Los medicamentos pierden eficacia al omitir las dosis. El hecho de [employer] Corporation  dosis tambin Textron Incaumenta el riesgo de otros problemas. Comunquese con un mdico si:  Piensa que tiene Runner, broadcasting/film/videouna reaccin alrgica a los medicamentos que ha tomado.  Tiene dolores de cabeza frecuentes (recurrentes).  Siente mareos.  Tiene hinchazn en los tobillos.  Tiene problemas de visin. Solicite ayuda de inmediato si:  Siente un dolor de cabeza intenso o confusin.  Siente debilidad inusual, adormecimiento o que Hospital doctorse desmayar.  Siente un dolor intenso en el pecho o el abdomen.  Vomita repetidas veces.  Tiene dificultad para respirar. Resumen  La hipertensin se produce cuando la sangre bombea en las arterias con mucha fuerza.  Si esta afeccin no se controla, podra correr riesgo de tener complicaciones graves.  La presin arterial deseada puede variar en funcin de las enfermedades, la edad y otros factores personales. Para la Franklin Resourcesmayora de las personas, una presin arterial normal es menor que 120/80.  La hipertensin se puede controlar mediante cambios en el estilo de vida, tomando medicamentos, o ambas cosas. Los Danaher Corporationcambios en el estilo de vida incluyen prdida de peso, ingerir alimentos sanos, seguir una dieta baja en sodio, hacer ms ejercicio y Glass blower/designerlimitar el consumo de alcohol. Esta informacin no tiene Theme park managercomo fin reemplazar el consejo del mdico. Asegrese de hacerle al mdico cualquier pregunta que tenga. Document Released: 03/28/2012 Document Revised: 09/12/2016 Document Reviewed: 06/15/2016 Elsevier Patient Education  [employer]2020 Elsevier Inc.     If you have lab work done today you will be contacted with your lab results within the next 2 weeks.  If you have not heard from us then please contact us. The fastest way to get your results is to register for My Chart.   IF you received an x-ray today, you will receive an invoice [facility]. Please contact [facility] at [phone] with questions or concerns regarding your invoice.   IF you received labwork today, you will receive an invoice from [facility]. Please contact [facility] at [phone] with questions or concerns regarding your invoice.   Our billing staff will not be able to assist you with questions regarding bills from these companies.  You will be contacted with the lab results as soon as they are available. The fastest way to get your results is to activate your My Chart account. Instructions are located on the last page of this paperwork. If you have not heard from us regarding the results in 2 weeks, please contact this office.

## 2019-06-11 ENCOUNTER — Ambulatory Visit: Payer: Medicaid Other | Admitting: Family Medicine

## 2019-06-11 LAB — COMPREHENSIVE METABOLIC PANEL
ALT: 62 IU/L — ABNORMAL HIGH (ref 0–32)
AST: 35 IU/L (ref 0–40)
Albumin/Globulin Ratio: 1.8 (ref 1.2–2.2)
Albumin: 4.6 g/dL (ref 3.8–4.8)
Alkaline Phosphatase: 91 IU/L (ref 39–117)
BUN/Creatinine Ratio: 21 (ref 9–23)
BUN: 11 mg/dL (ref 6–24)
Bilirubin Total: 0.3 mg/dL (ref 0.0–1.2)
CO2: 22 mmol/L (ref 20–29)
Calcium: 8.9 mg/dL (ref 8.7–10.2)
Chloride: 108 mmol/L — ABNORMAL HIGH (ref 96–106)
Creatinine, Ser: 0.53 mg/dL — ABNORMAL LOW (ref 0.57–1.00)
GFR calc Af Amer: 129 mL/min/{1.73_m2} (ref >59)
GFR calc non Af Amer: 112 mL/min/{1.73_m2} (ref >59)
Globulin, Total: 2.5 g/dL (ref 1.5–4.5)
Glucose: 98 mg/dL (ref 65–99)
Potassium: 4 mmol/L (ref 3.5–5.2)
Sodium: 144 mmol/L (ref 134–144)
Total Protein: 7.1 g/dL (ref 6.0–8.5)

## 2019-06-11 LAB — LIPID PANEL
Chol/HDL Ratio: 6 ratio — ABNORMAL HIGH (ref 0.0–4.4)
Cholesterol, Total: 221 mg/dL — ABNORMAL HIGH (ref 100–199)
HDL: 37 mg/dL — ABNORMAL LOW (ref >39)
LDL Chol Calc (NIH): 149 mg/dL — ABNORMAL HIGH (ref 0–99)
Triglycerides: 191 mg/dL — ABNORMAL HIGH (ref 0–149)
VLDL Cholesterol Cal: 35 mg/dL (ref 5–40)

## 2019-12-25 ENCOUNTER — Other Ambulatory Visit: Payer: Self-pay | Admitting: Family Medicine

## 2019-12-25 DIAGNOSIS — I10 Essential (primary) hypertension: Secondary | ICD-10-CM

## 2019-12-25 MED ORDER — LISINOPRIL 10 MG PO TABS: 10.0000 mg | ORAL_TABLET | Freq: Every day | ORAL | 0 refills | Status: DC

## 2019-12-25 NOTE — Telephone Encounter (Signed)
Medication Refill - Medication:  lisinopril (ZESTRIL) 10 MG tablet [[id number]]  Pt need not pick up refill on time and so pharmacy cancelled it.   Has the patient contacted their pharmacy? No. (Agent: If no, request that the patient contact the pharmacy for the refill.) (Agent: If yes, when and what did the pharmacy advise?)  Preferred Pharmacy (with phone number or street name): [facility] on [address]   Agent: Please be advised that RX refills may take up to 3 business days. We ask that you follow-up with your pharmacy.

## 2019-12-25 NOTE — Telephone Encounter (Signed)
Appointment 03/06/20

## 2020-03-06 ENCOUNTER — Other Ambulatory Visit: Payer: Self-pay

## 2020-03-06 ENCOUNTER — Ambulatory Visit (INDEPENDENT_AMBULATORY_CARE_PROVIDER_SITE_OTHER): Payer: Self-pay | Admitting: Family Medicine

## 2020-03-06 ENCOUNTER — Encounter: Payer: Self-pay | Admitting: Family Medicine

## 2020-03-06 VITALS — BP 130/86 | HR 71 | Temp 98.1°F | Resp 15 | Ht 63.0 in | Wt 143.8 lb

## 2020-03-06 DIAGNOSIS — E785 Hyperlipidemia, unspecified: Secondary | ICD-10-CM

## 2020-03-06 DIAGNOSIS — Z7189 Other specified counseling: Secondary | ICD-10-CM

## 2020-03-06 DIAGNOSIS — I10 Essential (primary) hypertension: Secondary | ICD-10-CM

## 2020-03-06 DIAGNOSIS — Z7185 Encounter for immunization safety counseling: Secondary | ICD-10-CM

## 2020-03-06 LAB — LIPID PANEL
Chol/HDL Ratio: 6 ratio — ABNORMAL HIGH (ref 0.0–4.4)
Cholesterol, Total: 223 mg/dL — ABNORMAL HIGH (ref 100–199)
HDL: 37 mg/dL — ABNORMAL LOW (ref >39)
LDL Chol Calc (NIH): 144 mg/dL — ABNORMAL HIGH (ref 0–99)
Triglycerides: 229 mg/dL — ABNORMAL HIGH (ref 0–149)
VLDL Cholesterol Cal: 42 mg/dL — ABNORMAL HIGH (ref 5–40)

## 2020-03-06 LAB — COMPREHENSIVE METABOLIC PANEL
ALT: 33 IU/L — ABNORMAL HIGH (ref 0–32)
AST: 21 IU/L (ref 0–40)
Albumin/Globulin Ratio: 1.7 (ref 1.2–2.2)
Albumin: 4.6 g/dL (ref 3.8–4.8)
Alkaline Phosphatase: 95 IU/L (ref 48–121)
BUN/Creatinine Ratio: 15 (ref 9–23)
BUN: 9 mg/dL (ref 6–24)
Bilirubin Total: 0.3 mg/dL (ref 0.0–1.2)
CO2: 24 mmol/L (ref 20–29)
Calcium: 9.5 mg/dL (ref 8.7–10.2)
Chloride: 104 mmol/L (ref 96–106)
Creatinine, Ser: 0.62 mg/dL (ref 0.57–1.00)
GFR calc Af Amer: 122 mL/min/{1.73_m2} (ref >59)
GFR calc non Af Amer: 105 mL/min/{1.73_m2} (ref >59)
Globulin, Total: 2.7 g/dL (ref 1.5–4.5)
Glucose: 89 mg/dL (ref 65–99)
Potassium: 4.4 mmol/L (ref 3.5–5.2)
Sodium: 141 mmol/L (ref 134–144)
Total Protein: 7.3 g/dL (ref 6.0–8.5)

## 2020-03-06 MED ORDER — LISINOPRIL 10 MG PO TABS: 10.0000 mg | ORAL_TABLET | Freq: Every day | ORAL | 2 refills | Status: DC

## 2020-03-06 NOTE — Progress Notes (Signed)
 Subjective:  Patient ID: Christy Hernandez, female    DOB: 02/21/1971  Age: 49 y.o. MRN: [id number]  Spanish interpreter: #[id number]  CC:  Chief Complaint  Patient presents with  . Hypertension    pt reports she has been taking the lisinopril as dirrected, pt needs a refill today reports working well for her with no side effects, denies physical symptoms     HPI Christy Hernandez presents for   Hypertension: Lisinopril 10mg qd. No new side effects.  No cough.  Home readings: none.  Exercise: walking in afternoons.   BP Readings from Last 3 Encounters:  03/06/20 130/86  06/10/19 133/75  05/14/19 (!) 142/99   Lab Results  Component Value Date   CREATININE 0.53 (L) 06/10/2019    Hyperlipidemia: Not on statin, low ascvd risk score prior.  FH of CAD: none. The 10-year ASCVD risk score (Goff DC Jr., et al., 2013) is: 3.2%   Values used to calculate the score:     Age: 49 years     Sex: Female     Is Non-Hispanic African American: No     Diabetic: No     Tobacco smoker: No     Systolic Blood Pressure: 130 mmHg     Is BP treated: Yes     HDL Cholesterol: 37 mg/dL     Total Cholesterol: 221 mg/dL  Lab Results  Component Value Date   CHOL 221 (H) 06/10/2019   HDL 37 (L) 06/10/2019   LDLCALC 149 (H) 06/10/2019   TRIG 191 (H) 06/10/2019   CHOLHDL 6.0 (H) 06/10/2019   Lab Results  Component Value Date   ALT 62 (H) 06/10/2019   AST 35 06/10/2019   ALKPHOS 91 06/10/2019   BILITOT 0.3 06/10/2019   Health maintenance: Plan for physical in next 6 months.  Colon - no prior Cologuard/colonoscopy. Screening options with colonoscopy versus Cologuard discussed. Discussed timing of repeat testing intervals if normal, as well as potential need for diagnostic Colonoscopy if positive Cologuard. She would like to think about this further.   Immunization History  Administered Date(s) Administered  . Hepatitis A 04/15/2009, 06/04/2009  . Hepatitis B  04/15/2009, 06/04/2009  . MMR 05/20/2006  . Td 01/09/2006  . Tdap 08/27/2015   covid 19 vaccine: has not had, declines. No questions. Recommended, and advised to let me know if there are questions.   History Patient Active Problem List   Diagnosis Date Noted  . Well woman exam with routine gynecological exam 05/14/2019  . Flu-like symptoms 09/19/2017  . Nonintractable headache 09/19/2017  . Fever 09/19/2017  . Generalized muscle ache 09/19/2017   Past Medical History:  Diagnosis Date  . Anemia   . Hypertension    Past Surgical History:  Procedure Laterality Date  . CESAREAN SECTION    . INCISION AND DRAINAGE ABSCESS Right 04/10/2018   Procedure: INCISION AND DRAINAGE OF 2 RIGHT BREAST ABSCESSES X2;  Surgeon: Tsuei, Matthew, MD;  Location: [facility] OR;  Service: General;  Laterality: Right;  . TUBAL LIGATION Bilateral 11/26/2015   Procedure: POST PARTUM TUBAL LIGATION;  Surgeon: Peggy Constant, MD;  Location: [facility];  Service: Gynecology;  Laterality: Bilateral;   No Known Allergies Prior to Admission medications   Medication Sig Start Date End Date Taking? Authorizing Provider  lisinopril (ZESTRIL) 10 MG tablet Take 1 tablet (10 mg total) by mouth daily. 12/25/19  Yes Greene, Jeffrey R, MD   Social History   Socioeconomic History  . Marital status:   Married    Spouse name: Not on file  . Number of children: Not on file  . Years of education: Not on file  . Highest education level: Not on file  Occupational History  . Not on file  Tobacco Use  . Smoking status: Never Smoker  . Smokeless tobacco: Never Used  Vaping Use  . Vaping Use: Never used  Substance and Sexual Activity  . Alcohol use: No  . Drug use: No  . Sexual activity: Yes    Birth control/protection: Surgical  Other Topics Concern  . Not on file  Social History Narrative  . Not on file   Social Determinants of Health   Financial Resource Strain:   . Difficulty of Paying Living Expenses: Not on file  Food  Insecurity:   . Worried About Charity fundraiser in the Last Year: Not on file  . Ran Out of Food in the Last Year: Not on file  Transportation Needs: No Transportation Needs  . Lack of Transportation (Medical): No  . Lack of Transportation (Non-Medical): No  Physical Activity:   . Days of Exercise per Week: Not on file  . Minutes of Exercise per Session: Not on file  Stress:   . Feeling of Stress : Not on file  Social Connections:   . Frequency of Communication with Friends and Family: Not on file  . Frequency of Social Gatherings with Friends and Family: Not on file  . Attends Religious Services: Not on file  . Active Member of Clubs or Organizations: Not on file  . Attends Archivist Meetings: Not on file  . Marital Status: Not on file  Intimate Partner Violence:   . Fear of Current or Ex-Partner: Not on file  . Emotionally Abused: Not on file  . Physically Abused: Not on file  . Sexually Abused: Not on file    Review of Systems  Constitutional: Negative for fatigue and unexpected weight change.  Respiratory: Negative for chest tightness and shortness of breath.   Cardiovascular: Negative for chest pain, palpitations and leg swelling.  Gastrointestinal: Negative for abdominal pain and blood in stool.  Neurological: Negative for dizziness, syncope, light-headedness and headaches.     Objective:   Vitals:   03/06/20 1018 03/06/20 1026  BP: (!) 149/94 130/86  Pulse: 71   Resp: 15   Temp: 98.1 F (36.7 C)   TempSrc: Temporal   SpO2: 99%   Weight: 143 lb 12.8 oz (65.2 kg)   Height: 5' 3" (1.6 m)      Physical Exam Vitals reviewed.  Constitutional:      Appearance: She is well-developed.  HENT:     Head: Normocephalic and atraumatic.  Eyes:     Conjunctiva/sclera: Conjunctivae normal.     Pupils: Pupils are equal, round, and reactive to light.  Neck:     Vascular: No carotid bruit.  Cardiovascular:     Rate and Rhythm: Normal rate and regular  rhythm.     Heart sounds: Normal heart sounds.  Pulmonary:     Effort: Pulmonary effort is normal.     Breath sounds: Normal breath sounds.  Abdominal:     Palpations: Abdomen is soft. There is no pulsatile mass.     Tenderness: There is no abdominal tenderness.  Skin:    General: Skin is warm and dry.  Neurological:     Mental Status: She is alert and oriented to person, place, and time.  Psychiatric:  Behavior: Behavior normal.        Assessment & Plan:  Christy Hernandez is a 49 y.o. female . Hyperlipidemia, unspecified hyperlipidemia type - Plan: Comprehensive metabolic panel, Lipid panel  -Mild elevation previously but low ASCVD risk score.  No family history of coronary disease.  Recheck labs.  Continue to walk for exercise, watch diet.  Essential hypertension - Plan: lisinopril (ZESTRIL) 10 MG tablet  -Stable on recheck, handout given on home management, continue same dose lisinopril.  Labs pending.  6-month follow-up for physical  Vaccine counseling  - declines covid vaccine.  Recommended and questions were answered.  HM  -Colonoscopy versus Cologuard discussed, she would like to decline this at this time.  If she changes her mind order without office visit. No orders of the defined types were placed in this encounter.  Patient Instructions       If you have lab work done today you will be contacted with your lab results within the next 2 weeks.  If you have not heard from us then please contact us. The fastest way to get your results is to register for My Chart.   IF you received an x-ray today, you will receive an invoice from [facility]. Please contact [facility] at [phone] with questions or concerns regarding your invoice.   IF you received labwork today, you will receive an invoice from [facility]. Please contact [facility] at [phone] with questions or concerns regarding your invoice.   Our billing staff will not  be able to assist you with questions regarding bills from these companies.  You will be contacted with the lab results as soon as they are available. The fastest way to get your results is to activate your My Chart account. Instructions are located on the last page of this paperwork. If you have not heard from us regarding the results in 2 weeks, please contact this office.         Signed, Jeffrey Greene, MD [facility]  

## 2020-03-06 NOTE — Patient Instructions (Addendum)
Blood pressure ok today. No med changes at this time.  I will check cholesterol levels again, but no meds needed at this time.  Let me know if you are interested in colonoscopy or Cologuard for colon cancer testing.  Here is a link for more information on the covid 19 vaccine. Please let me know if there are questions. [email or web address]   Cmo controlar su hipertensin Managing Your Hypertension La hipertensin se denomina usualmente presin arterial alta. Ocurre cuando la sangre presiona contra las paredes de las arterias con demasiada fuerza. Las arterias son los vasos sanguneos que transportan la sangre desde el corazn hacia todas las partes del cuerpo. La hipertensin hace que el corazn haga ms esfuerzo para Insurance account manager y [employer] que las arterias se Armed forces training and education officer o Multimedia programmer. La hipertensin no tratada o no controlada puede causar infarto de miocardio, accidente cerebrovascular, enfermedad renal y otros problemas. Qu son las Merchandiser, retail de presin arterial? Una lectura de la presin arterial consiste de un nmero ms alto sobre un nmero ms bajo. En condiciones ideales, la presin arterial debe estar por debajo de 120/80. El primer nmero ("superior") es la presin sistlica. Es la medida de la presin de las arterias cuando el corazn late. El segundo nmero ("inferior") es la presin diastlica. Es la medida de la presin en las arterias cuando el corazn se relaja. Qu significa mi lectura de presin arterial? La presin arterial se clasifica en cuatro etapas. Sobre la base de la lectura de su presin arterial, el mdico puede usar las siguientes etapas para determinar si necesita tratamiento y de qu tipo. La presin sistlica y la presin diastlica se miden en una unidad llamada mm Hg. Normal  Presin sistlica: por debajo de 120.  Presin diastlica: por debajo de 80. Elevada  Presin sistlica: 120-129.  Presin diastlica: por debajo de  80. Etapa 1 de hipertensin  Presin sistlica: 130-139.  Presin diastlica: 80-89. Etapa 2 de hipertensin  Presin sistlica: 140 o ms.  Presin diastlica: 90 o ms. Cules son los riesgos para la salud asociados con la hipertensin? Controlar la hipertensin es una responsabilidad importante. La hipertensin no controlada puede causar:  Infarto de miocardio.  Accidente cerebrovascular.  Debilitamiento de los vasos sanguneos (aneurisma).  Insuficiencia cardaca.  Dao renal.  Dao ocular.  Sndrome metablico.  Problemas de memoria y concentracin. Qu cambios puedo hacer para controlar mi hipertensin? La hipertensin se puede controlar haciendo [employer] estilo de vida y, posiblemente, tomando medicamentos. Su mdico le ayudar a crear un plan para bajar la presin arterial al rango normal. Comida y bebida   Siga una dieta con alto contenido de fibras y [place], y con bajo contenido de sal (sodio), azcar agregada y Rosalin Hawking. Un ejemplo de plan alimenticio es la dieta DASH (Dietary Approaches to Stop Hypertension, Mtodos alimenticios para detener la hipertensin). Para alimentarse de esta manera: ? Coma mucha fruta y verdura fresca. Trate de que la mitad del plato de cada comida sea de frutas y verduras. ? Coma cereales integrales, como pasta integral, arroz integral y pan integral. Llene aproximadamente un cuarto del plato con cereales integrales. ? Consuma productos lcteos con bajo contenido de grasa. ? Evite la ingesta de cortes de carne grasa, carne procesada o curada, y carne de ave con piel. Llene aproximadamente un cuarto del plato con protenas magras, como pescado, pollo sin piel, frijoles, huevos y tofu. ? Evite ingerir alimentos prehechos o procesados. En general, estos tienen mayor cantidad de sodio, azcar agregada y Steffanie Rain[place].  Reduzca su ingesta diaria de sodio. La mayora de las personas que tienen hipertensin deben comer menos de 1500 mg de sodio  por [employer].  Limite el consumo de alcohol a no ms de 1 medida por da si es mujer y no est Orthoptist y a 2 medidas por da si es hombre. Una medida equivale a 12onzas de cerveza, 5onzas de vino o 1onzas de bebidas alcohlicas de alta graduacin. Estilo de vida  Trabaje con su mdico para mantener un peso saludable o Curator. Pregntele cual es su peso recomendado.  Realice al menos 30 minutos de ejercicio que haga que se acelere su corazn (ejercicio Magazine features editor) la [employer] de la [place]. Estas actividades pueden incluir caminar, nadar o andar en bicicleta.  Incluya ejercicios para fortalecer sus msculos (ejercicios de resistencia), como levantamiento de pesas, como parte de su rutina semanal de ejercicios. Intente realizar de este tipo de ejercicios al Kellogg a la [place].  No consuma ningn producto que contenga nicotina o tabaco, como cigarrillos y Administrator, Civil Service. Si necesita ayuda para dejar de fumar, consulte al [employer].  Controle las enfermedades a largo plazo (crnicas), como el colesterol alto o la diabetes. Control  Contrlese la presin arterial en su casa segn las indicaciones del mdico. La presin arterial deseada puede variar en funcin de las enfermedades, la edad y otros factores personales.  Contrlese la presin arterial de manera regular, en la frecuencia indicada por su mdico. Trabaje con su mdico  Revise con su mdico todos los medicamentos que toma ya que puede haber efectos secundarios o interacciones.  Hable con su mdico acerca de la dieta, hbitos de ejercicio y otros factores del estilo de vida que pueden contribuir a la hipertensin.  Consulte a su mdico regularmente. Su mdico puede ayudarle a crear y [place] su plan para controlar la hipertensin. Debo tomar un medicamento para controlar mi presin arterial? El mdico puede recetarle medicamentos si los cambios en el estilo de vida no son suficientes para  Museum/gallery curator la presin arterial y si:  Su presin arterial sistlica es de 130 o ms.  Su presin arterial diastlica es de 80 o ms. Tome los medicamentos solamente como se lo haya indicado el mdico. Siga cuidadosamente las indicaciones. Los medicamentos para la presin arterial deben tomarse segn las indicaciones. Los medicamentos pierden eficacia al omitir las dosis. El hecho de omitir las dosis tambin [place] el riesgo de otros problemas. Comunquese con un mdico si:  Piensa que tiene Runner, broadcasting/film/video a los medicamentos que ha tomado.  Tiene dolores de cabeza frecuentes (recurrentes).  Siente mareos.  Tiene hinchazn en los tobillos.  Tiene problemas de visin. Solicite ayuda de inmediato si:  Siente un dolor de cabeza intenso o confusin.  Siente debilidad inusual, adormecimiento o que Hospital doctor.  Siente un dolor intenso en el pecho o el abdomen.  Vomita repetidas veces.  Tiene dificultad para respirar. Resumen  La hipertensin se produce cuando la sangre bombea en las arterias con mucha fuerza. Si esta afeccin no se controla, podra correr riesgo de tener complicaciones graves.  La presin arterial deseada puede variar en funcin de las enfermedades, la edad y otros factores personales. Para la [employer], una presin arterial normal es menor que 120/80.  La hipertensin se puede controlar mediante cambios en el estilo de vida, tomando medicamentos, o ambas cosas. Los [employer] estilo de vida incluyen prdida de [place], ingerir alimentos sanos, seguir una dieta baja  en sodio, hacer ms ejercicio y limitar el consumo de alcohol. Esta informacin no tiene Theme park manager el consejo del mdico. Asegrese de hacerle al mdico cualquier pregunta que tenga. Document Revised: 09/12/2016 Document Reviewed: 06/15/2016 Elsevier Patient Education  [employer].    If you have lab work done today you will be contacted with your lab results  within the next 2 weeks.  If you have not heard from [place] then please contact us. The fastest way to get your results is to register for My Chart.   IF you received an x-ray today, you will receive an invoice from [facility]. Please contact [facility] at [phone] with questions or concerns regarding your invoice.   IF you received labwork today, you will receive an invoice from [place]. Please contact [facility] at [phone] with questions or concerns regarding your invoice.   Our billing staff will not be able to assist you with questions regarding bills from these companies.  You will be contacted with the lab results as soon as they are available. The fastest way to get your results is to activate your My Chart account. Instructions are located on the last page of this paperwork. If you have not heard from [place] regarding the results in 2 weeks, please contact this office.

## 2020-04-28 ENCOUNTER — Other Ambulatory Visit: Payer: Self-pay | Admitting: Obstetrics and Gynecology

## 2020-04-28 DIAGNOSIS — Z1231 Encounter for screening mammogram for malignant neoplasm of breast: Secondary | ICD-10-CM

## 2020-06-25 ENCOUNTER — Ambulatory Visit: Payer: Self-pay | Admitting: *Deleted

## 2020-06-25 ENCOUNTER — Ambulatory Visit: Payer: No Typology Code available for payment source

## 2020-06-25 ENCOUNTER — Other Ambulatory Visit: Payer: Self-pay

## 2020-06-25 VITALS — BP 142/98 | Wt 144.5 lb

## 2020-06-25 DIAGNOSIS — Z1239 Encounter for other screening for malignant neoplasm of breast: Secondary | ICD-10-CM

## 2020-06-25 NOTE — Progress Notes (Signed)
Ms. Christy Hernandez is a 49 y.o. female who presents to [facility] clinic today with no complaints.    Pap Smear: Pap smear not completed today. Last Pap smear was 05/14/2019 at [facility] clinic and was normal with negative HPV. Per patient has no history of an abnormal Pap smear. Last Pap smear result is available in Epic.   Physical exam: Breasts Breasts symmetrical.Two scars observed left breast at 9 o'clock from history of breast infection per patient. Three scars observed right breast at 12 o'clock, 2 o'clock, and inner breast next to areola from history of breast infections per patient. No nipple retraction bilateral breasts. No nipple discharge bilateral breasts. No lymphadenopathy. No lumps palpated bilateral breasts. No complaints of pain or tenderness on exam.      Pelvic/Bimanual Pap is not indicated today per BCCCP guidelines.   Smoking History: Patient has never smoked.    Patient Navigation: Patient education provided. Access to services provided for patient through [place] program. Spanish interpreter Natale Lay from [facility] provided.   Colorectal Cancer Screening: Per patient has never had colonoscopy completed. No complaints today.    Breast and Cervical Cancer Risk Assessment: Patient does not have family history of breast cancer, known genetic mutations, or radiation treatment to the chest before age 31. Patient does not have history of cervical dysplasia, immunocompromised, or DES exposure in-utero.  Risk Assessment    Risk Scores      06/25/2020 05/14/2019   Last edited by: Meryl Dare, CMA Nyela Cortinas, Carlye Grippe, RN   5-year risk: 0.7 % 0.7 %   Lifetime risk: 6.1 % 6.2 %          A: BCCCP exam without pap smear No complaints.  P: Referred patient to the [facility] for a screening mammogram on the mobile unit. Appointment scheduled Thursday, June 25, 2020 at 1500.  Priscille Heidelberg, RN 06/25/2020 2:31 PM

## 2020-06-25 NOTE — Patient Instructions (Signed)
Explained breast self awareness with Irven Baltimore. Patient did not need a Pap smear today due to last Pap smear and HPV typing was 05/14/2019. Let her know BCCCP will cover Pap smears and HPV typing every 5 years unless has a history of abnormal Pap smears. Referred patient to the [facility] for a screening mammogram on the mobile unit. Appointment scheduled Thursday, June 25, 2020 at 1500. Patient escorted to the mobile unit following BCCCP appointment for her screening mammogram. Let patient know the Breast Center will follow up with her within the next couple weeks with results of her mammogram by letter or phone. Irven Baltimore verbalized understanding.  Shafiq Larch, Kathaleen Maser, RN 2:31 PM

## 2020-08-20 ENCOUNTER — Ambulatory Visit
Admission: RE | Admit: 2020-08-20 | Discharge: 2020-08-20 | Disposition: A | Discharge status: Home / Self Care | Payer: No Typology Code available for payment source | Source: Ambulatory Visit | Attending: Obstetrics and Gynecology | Admitting: Obstetrics and Gynecology

## 2020-08-20 ENCOUNTER — Other Ambulatory Visit: Payer: Self-pay

## 2020-08-20 DIAGNOSIS — Z1231 Encounter for screening mammogram for malignant neoplasm of breast: Secondary | ICD-10-CM

## 2021-04-08 ENCOUNTER — Other Ambulatory Visit: Payer: Self-pay

## 2021-04-08 ENCOUNTER — Ambulatory Visit (INDEPENDENT_AMBULATORY_CARE_PROVIDER_SITE_OTHER): Payer: Self-pay | Admitting: Family Medicine

## 2021-04-08 VITALS — BP 122/80 | HR 84 | Temp 98.3°F | Resp 16 | Ht 63.0 in | Wt 146.2 lb

## 2021-04-08 DIAGNOSIS — E785 Hyperlipidemia, unspecified: Secondary | ICD-10-CM

## 2021-04-08 DIAGNOSIS — I10 Essential (primary) hypertension: Secondary | ICD-10-CM

## 2021-04-08 LAB — COMPREHENSIVE METABOLIC PANEL
ALT: 40 U/L — ABNORMAL HIGH (ref 0–35)
AST: 25 U/L (ref 0–37)
Albumin: 4.4 g/dL (ref 3.5–5.2)
Alkaline Phosphatase: 73 U/L (ref 39–117)
BUN: 10 mg/dL (ref 6–23)
CO2: 29 mEq/L (ref 19–32)
Calcium: 9 mg/dL (ref 8.4–10.5)
Chloride: 104 mEq/L (ref 96–112)
Creatinine, Ser: 0.58 mg/dL (ref 0.40–1.20)
GFR: 104.92 mL/min (ref >60.00)
Glucose, Bld: 82 mg/dL (ref 70–99)
Potassium: 3.8 mEq/L (ref 3.5–5.1)
Sodium: 140 mEq/L (ref 135–145)
Total Bilirubin: 0.4 mg/dL (ref 0.2–1.2)
Total Protein: 7.2 g/dL (ref 6.0–8.3)

## 2021-04-08 LAB — LIPID PANEL
Cholesterol: 223 mg/dL — ABNORMAL HIGH (ref 0–200)
HDL: 37.4 mg/dL — ABNORMAL LOW (ref >39.00)
NonHDL: 185.85
Total CHOL/HDL Ratio: 6
Triglycerides: 287 mg/dL — ABNORMAL HIGH (ref 0.0–149.0)
VLDL: 57.4 mg/dL — ABNORMAL HIGH (ref 0.0–40.0)

## 2021-04-08 MED ORDER — LISINOPRIL 10 MG PO TABS: 10.0000 mg | ORAL_TABLET | Freq: Every day | ORAL | 3 refills | Status: DC

## 2021-04-08 NOTE — Patient Instructions (Addendum)
Cut back on fried food Christy try to avoid sodas - water is best.  No med changes for now.   Colesterol elevado High Cholesterol El colesterol elevado es una afeccin que se caracteriza porque la sangre tiene niveles altos de una sustancia Christy Hernandez, cerosa y parecida a la grasa (colesterol). El hgado fabrica todo el colesterol que el organismo necesita. El organismo humano necesita pequeas cantidades de colesterol para formar las clulas. El exceso de colesterol se obtiene de los alimentos que se consumen. La sangre transporta el colesterol desde el hgado al resto del cuerpo. Si tiene el colesterol elevado, pueden acumularse depsitos (placas) en las paredes de las arterias. Las arterias son los vasos sanguneos que transportan la sangre desde el corazn al resto del cuerpo. Las placas causan que las arterias se Christy Hernandez y se endurezcan. Las placas de colesterol aumentan el riesgo de sufrir un infarto de miocardio y un accidente cerebrovascular. Trabaje con el mdico para Christy Hernandez concentraciones de colesterol en un rango saludable. Qu incrementa el riesgo? Los siguientes factores pueden hacer que sea ms propenso a Aeronautical engineer afeccin: Consumir alimentos con alto contenido de grasa animal (grasa saturada) o colesterol. Tener sobrepeso. No hacer suficiente ejercicio fsico. Tener antecedentes familiares de colesterol alto (hipercolesterolemia familiar). Consumir productos con tabaco. Tener diabetes. Cules son los signos o sntomas? En la [employer], el colesterol alto no causa ningn sntoma por lo general. En los [place] graves, los niveles muy altos de colesterol pueden causar: Protuberancias de grasa debajo de la piel (xantomas). Un anillo blanco o gris alrededor del centro negro (pupila) del ojo. Cmo se diagnostica? Esta afeccin se puede diagnosticar en funcin de los Christy Hernandez,Christy Hernandez de un anlisis de Christy Hernandez. Si es mayor de 20 aos, es posible que el mdico le controle  el nivel de colesterol cada 4 a 6 aos. Los controles pueden ser ms frecuentes si tiene el colesterol elevado u otros factores de riesgo de enfermedad cardaca. En el anlisis de sangre de Christy Hernandez, se determina lo siguiente: El colesterol "malo", o colesterol LDL. Este es el principal tipo de colesterol que causa enfermedades cardacas. El nivel recomendado es de menos de 100 mg/dl (4.13 mmol/l). El colesterol "bueno", o colesterol HDL. El HDL ayuda a proteger contra la enfermedad cardaca porque limpia las arterias y arrastra el LDL al hgado para que lo procese. El nivel recomendado de HDL es de 60 mg/dl (2.44 mmol/l) o ms. Triglicridos. Estos son grasas que el organismo puede Academic librarian o quemar como fuente de [place]. El nivel recomendado es de menos de 150 mg/dl (0.10 mmol/l). Colesterol total. Mide la cantidad total de colesterol en la sangre, e incluye el colesterol LDL, el colesterol HDL y los triglicridos. El nivel recomendado es de menos de 200 mg/dl (2.72 mmol/l). Cmo se trata? El tratamiento para el colesterol alto comienza con cambios en el estilo de vida, tales como dieta y ejercicio. Cambios en la dieta. Es posible que le indiquen que consuma alimentos con ms [place] y menos grasas saturadas o Engineer, mining. Cambios en el estilo de vida. Estos pueden incluir hacer actividad fsica con regularidad, mantener un peso saludable y dejar de consumir productos con tabaco. Medicamentos. Estos se administran cuando los [employer] dieta y en el estilo de vida no han sido eficaces. Es posible que le receten medicamentos llamados estatinas para bajar sus niveles de colesterol. Siga estas instrucciones en su casa: Comida y bebida  Siga una dieta saludable y [place]. Esta dieta incluye lo siguiente:  Porciones diarias de frutas y verduras frescas, congeladas o enlatadas. Porciones diarias de alimentos integrales con alto contenido de [place]. Alimentos con bajo contenido de grasas  saturadas y grasas trans. Estos incluyen carne de ave y pescado sin piel, cortes de carne magros y productos lcteos descremados. Una variedad de pescado, especialmente pescado graso que contenga cidos grasos omega 3. Propngase comer pescado al [employer] veces por semana. Evite los alimentos y las bebidas que tengan Engineer, mining. Use mtodos de coccin saludables, como asar, Software engineer, hervir, hornear, escalfar, cocer al vapor y Actor. No fra los alimentos excepto para saltearlos. Si bebe alcohol: Limite la cantidad que bebe a lo siguiente: De 0 a 1 medida por da para las mujeres que no estn embarazadas. De 0 a 2 medidas por da para los hombres. Sepa cunta cantidad de alcohol hay en las bebidas. En los [address], una medida equivale a una botella de cerveza de 12 oz (355 ml), un vaso de vino de 5 oz (148 ml) o un vaso de una bebida alcohlica de alta graduacin de 1 oz (44 ml). Estilo de vida  Haga ejercicio con regularidad. Trate de hacer un total de 150 minutos de actividad fsica por semana. Aumente la cantidad de ejercicio fsico que realiza mediante actividades como la jardinera, Gaffer a Advertising account planner o usar las escaleras. No consuma ningn producto que contenga nicotina o tabaco. Estos productos incluyen cigarrillos, tabaco para Theatre manager y aparatos de vapeo, como los Administrator, Civil Service. Si necesita ayuda para dejar de consumir estos productos, consulte al Christy Hernandez. Indicaciones generales Use los medicamentos de venta libre y los recetados solamente como se lo haya indicado el mdico. Oceanographer a todas las visitas de seguimiento. Esto es importante. Dnde buscar ms informacin Christy Hernandez (Asociacin Estadounidense del Corazn): [email or web address] Christy Hernandez, Christy Hernandez, Christy Hernandez ([employer] del [place], los Pulmones y Risk manager): Christy Hernandez Comunquese con un mdico si: Tiene dificultad para alcanzar o mantener una alimentacin sana y un peso  saludable. Est por comenzar un programa de ejercicios. No puede dejar de fumar. Solicite ayuda de inmediato si: Midwife. Tiene dificultad para respirar. Tiene molestias o dolor en la [place], el cuello, la espalda, los hombros o [place]. Tiene sntomas de un accidente cerebrovascular. "BE FAST" es una manera fcil de recordar los principales signos de advertencia de un accidente cerebrovascular: B: Balance (equilibrio). Los signos son mareos, dificultad repentina para caminar o prdida del equilibrio. E - Eyes (ojos). Los signos son problemas para ver o un cambio repentino en la visin. F: Face (rostro). Los signos son debilidad repentina o entumecimiento del rostro, o el rostro o el prpado que se caen hacia un lado. A: Arms (brazos). Los signos son debilidad o entumecimiento en un brazo. Esto sucede de repente y generalmente en un lado del cuerpo. S: Speech (habla). Los signos son dificultad para hablar, hablar arrastrando las palabras o dificultad para comprender lo que las Forensic scientist. T: Time (tiempo). Es tiempo de llamar al servicio de Sports administrator. Anote la hora a la que Christy Hernandez sntomas. Presenta otros signos de un accidente cerebrovascular, como los siguientes: Dolor de cabeza sbito e intenso que no tiene causa aparente. Nuseas o vmitos. Convulsiones. Estos sntomas pueden representar un problema grave que constituye Radio broadcast assistant. No espere a ver si los sntomas desaparecen. Solicite atencin mdica de inmediato. Comunquese con el servicio de emergencias de su localidad (911 en los [place]). No conduzca por sus propios medios [employer]. Resumen  Las placas de colesterol aumentan el riesgo de sufrir un infarto de miocardio y un accidente cerebrovascular. Trabaje con el mdico para Christy Hernandez concentraciones de colesterol en un rango saludable. Siga una dieta saludable y equilibrada, haga ejercicio con regularidad y Christy Hernandez un peso  saludable. No consuma ningn producto que contenga nicotina o tabaco. Estos productos incluyen cigarrillos, tabaco para Theatre manager y aparatos de vapeo, como los Administrator, Civil Service. Obtenga ayuda de inmediato si tiene cualquier sntoma de un accidente cerebrovascular. Esta informacin no tiene Theme park manager el consejo del mdico. Asegrese de hacerle al mdico cualquier pregunta que tenga. Document Revised: 09/21/2020 Document Reviewed: 09/21/2020 Elsevier Patient Education  2022 Elsevier Inc.   Cmo controlar su hipertensin Managing Your Hypertension La hipertensin, tambin conocida como presin arterial alta, se produce cuando la sangre ejerce presin contra las paredes de las arterias con demasiada fuerza. Las arterias son los vasos sanguneos que transportan la sangre desde el corazn hacia todas las partes del cuerpo. La hipertensin hace que el corazn haga ms esfuerzo para Marine scientist y puede provocar que las arterias se Christy Hernandez o Multimedia programmer. Qu significan las lecturas de la presin arterial La presin arterial deseada puede variar en funcin de las enfermedades, la edad y otros factores personales. Una lectura de la presin arterial consta de un nmero ms alto sobre un nmero ms bajo. En condiciones ideales, la presin arterial debe estar por debajo de 120/80. Es importante que conozca lo siguiente: El Gaffer, o nmero superior, es la presin sistlica. Es la medida de la presin de las arterias cuando el corazn late. El segundo nmero, o nmero inferior, es la presin diastlica. Es la medida de la presin en las arterias cuando el corazn se relaja. La presin arterial se clasifica en cuatro etapas. Sobre la base de la lectura de su presin arterial, el mdico puede usar las siguientes etapas para determinar si necesita tratamiento y de qu tipo. La presin sistlica y la presin diastlica se miden en una unidad llamada mm Hg. Normales Presin sistlica: por  debajo de 120. Presin diastlica: por debajo de 80. Elevada Presin sistlica: 120-129. Presin diastlica: por debajo de 80. Etapa 1 de hipertensin Presin sistlica: 130-139. Presin diastlica: 80-89. Etapa 2 de hipertensin Presin sistlica: 140 o ms. Presin diastlica: 90 o ms. Cmo puede afectarme esta enfermedad? Controlar la hipertensin es una responsabilidad importante. Con el transcurso del [place], la hipertensin puede daar las arterias y Engineer, manufacturing systems flujo de sangre hacia partes importantes del cuerpo que incluyen el cerebro, el corazn y los riones. Tener hipertensin no tratada o no controlada puede causar: Infarto de miocardio. Accidente cerebrovascular. Debilitamiento de los vasos sanguneos (aneurisma). Insuficiencia cardaca. Dao renal. Dao ocular. Sndrome metablico. Problemas de memoria y concentracin. Demencia vascular. Qu medidas puedo tomar para controlar esta afeccin? La hipertensin se puede controlar haciendo Danaher Hernandez estilo de vida y, posiblemente, tomando medicamentos. Su mdico le ayudar a crear un plan para bajar la presin arterial al rango normal. Nutricin  Siga una dieta con alto contenido de fibras y [place], y con bajo contenido de sal (sodio), azcar agregada y grasas. Un ejemplo de plan alimenticio es la dieta sobre mtodos alimenticios para detener la hipertensin llamada DASH (Dietary Approaches to Stop Hypertension). Para alimentarse de esta manera: Coma mucha fruta y verdura fresca. Trate de que la mitad del plato de cada comida sea de frutas y verduras. Coma cereales integrales, como pasta integral, arroz integral o pan integral. Llene aproximadamente un cuarto del  plato con cereales integrales. Consuma productos lcteos descremados. Evite la ingesta de cortes de carne grasa, carne procesada o curada, y carne de ave con piel. Llene aproximadamente un cuarto del plato con protenas magras, como pescado, pollo sin piel,  frijoles, huevos o tofu. Evite ingerir alimentos prehechos y procesados. En general, estos tienen mayor cantidad de sodio, azcar agregada y Steffanie [place]. Reduzca su ingesta diaria de sodio. La mayora de las personas que tienen hipertensin deben comer menos de 1500 mg de sodio por Christy Hernandez. Estilo de vida  Trabaje con su mdico para mantener un peso saludable o Curator. Pregntele cul es el peso recomendado para usted. Realice al menos 30 minutos de ejercicio que haga que se acelere su corazn (ejercicio Magazine features editor) la [employer] de la [place]. Estas actividades pueden incluir caminar, nadar o andar en bicicleta. Incluya ejercicios para fortalecer sus msculos (ejercicios de resistencia), como levantamiento de pesas, como parte de su rutina semanal de ejercicios. Intente realizar 30 minutos de este tipo de ejercicios al [employer] a la [place]. No consuma ningn producto que contenga nicotina o tabaco, como cigarrillos, cigarrillos electrnicos y tabaco de Theatre manager. Si necesita ayuda para dejar de fumar, consulte al Christy Hernandez. Controle las enfermedades a largo plazo (crnicas), como el colesterol alto o la diabetes. Identifique sus causas de estrs y encuentre maneras de Charity fundraiser. Esto puede incluir meditacin, respiracin profunda o hacerse tiempo para Arts development Hernandez divertidas. Consumo de alcohol No beba alcohol si: Su mdico le indica no hacerlo. Est embarazada, puede estar embarazada o est tratando de [place]. Si bebe alcohol: Limite la cantidad que bebe: De 0 a 1 medida por da para las mujeres. De 0 a 2 medidas por da para los hombres. Est atento a la cantidad de alcohol que hay en las bebidas que toma. En los [address], una medida equivale a una botella de cerveza de 12 oz (355 ml), un vaso de vino de 5 oz (148 ml) o un vaso de una bebida alcohlica de alta graduacin de 1 oz (44 ml). Medicamentos El mdico puede recetarle medicamentos si los cambios  en el estilo de vida no son suficientes para Museum/gallery curator la presin arterial y si: Su presin arterial sistlica es de 130 o ms. Su presin arterial diastlica es de 80 o ms. Use los medicamentos solamente como se lo haya indicado el mdico. Siga cuidadosamente las indicaciones. Los medicamentos para la presin arterial deben tomarse como se lo haya indicado el mdico. Los medicamentos pierden eficacia al omitir las dosis. El hecho de omitir las dosis tambin Christy Hernandez el riesgo de otros problemas. Monitoreo Antes de [facility] presin arterial: No fume, no consuma bebidas con cafena ni haga ejercicio dentro de los 30 minutos antes de tomar la medicin. Vaya al bao y vace la vejiga (orine). Permanezca sentado tranquilamente durante al menos 5 minutos antes de tomar las mediciones. Contrlese la presin arterial en su casa segn las indicaciones del mdico. Para hacer esto: Sintese con la espalda recta y con apoyo. Coloque los pies planos en el piso. No se cruce de piernas. Apoye el brazo sobre una superficie plana, como una mesa. Asegrese de que la parte superior del brazo est al nivel del corazn. Cada vez que tome una medicin, tome dos o tres lecturas con un minuto de separacin y [employer]. Posiblemente tambin sea necesario que el mdico le controle la presin arterial de [place] regular. Informacin general Hable con su mdico acerca de la  dieta, hbitos de ejercicio y otros factores del estilo de vida que pueden contribuir a la hipertensin. Revise con su mdico todos los medicamentos que toma ya que puede haber efectos secundarios o interacciones. Concurra a todas las visitas tal como se lo haya indicado el mdico. El mdico puede ayudarle a crear y Christy Hernandez su plan para controlar la presin arterial alta. Dnde buscar ms informacin Christy Hernandez, Christy Hernandez, Christy Hernandez ([facility] [place], los Pulmones y Risk manager): Christy Hernandez Christy  Hernandez Hernandez (Asociacin Estadounidense del Corazn): [email or web address] Comunquese con un mdico si: Piensa que tiene Runner, broadcasting/film/video a los medicamentos que ha tomado. Tiene dolores de cabeza frecuentes (recurrentes). Siente mareos. Tiene hinchazn en los tobillos. Tiene problemas de visin. Solicite ayuda de inmediato si: Siente un dolor de cabeza intenso o confusin. Siente debilidad inusual, adormecimiento o que Hospital doctor. Siente un dolor intenso en el pecho o el abdomen. Vomita repetidas veces. Tiene dificultad para respirar. Estos sntomas pueden representar un problema grave que constituye Radio broadcast assistant. No espere a ver si los sntomas desaparecen. Solicite atencin mdica de inmediato. Comunquese con el servicio de emergencias de su localidad (911 en los [place]). No conduzca por sus propios medios [employer] [facility]. Resumen La hipertensin se produce cuando la sangre bombea en las arterias con mucha fuerza. Si esta afeccin no se controla, podra correr riesgo de tener complicaciones graves. La presin arterial deseada puede variar en funcin de las enfermedades, la edad y otros factores personales. Para la [employer], una presin arterial normal es menor que 120/80. La hipertensin se puede controlar mediante cambios en el estilo de vida, tomando medicamentos, o ambas cosas. Los Danaher Hernandez estilo de vida para ayudar a Chief Operating Hernandez la hipertensin incluyen prdida de [place], seguir una dieta saludable, con bajo contenido de sodio, hacer ms ejercicio, dejar de fumar y limitar el consumo de alcohol. Esta informacin no tiene Theme park manager el consejo del mdico. Asegrese de hacerle al mdico cualquier pregunta que tenga. Document Revised: 08/09/2019 Document Reviewed: 08/09/2019 Elsevier Patient Education  2022 [employer].

## 2021-04-08 NOTE — Progress Notes (Signed)
Subjective:  Patient ID: Christy Hernandez, female    DOB: 01-07-1971  Age: 50 y.o. MRN: [id number]  CC:  Chief Complaint  Patient presents with   Hypertension    Pt here for recheck and refill on medications    Hyperlipidemia    Pt had borderline lab work last check needs labs today    Here with interpreter and her daughter.   HPI Christy Hernandez presents for   Hypertension: Lisinopril 10 mg daily. No new side effects.  Home readings: none.  BP Readings from Last 3 Encounters:  04/08/21 122/80  06/25/20 (!) 142/98  03/06/20 130/86   Lab Results  Component Value Date   CREATININE 0.62 03/06/2020    Hyperlipidemia: Elevated readings August 2021.  10-year heart disease risk score 3.3%.  Borderline LFT elevation that was improved.  Plan for diet, exercise and recheck levels. No change in diet/exercise. Fried food daily.  Soda once per day. No alcohol. Rare tylenol - not daily.  No FH of early heart disease.   Wt Readings from Last 3 Encounters:  04/08/21 146 lb 3.2 oz (66.3 kg)  06/25/20 144 lb 8 oz (65.5 kg)  03/06/20 143 lb 12.8 oz (65.2 kg)    Lab Results  Component Value Date   CHOL 223 (H) 03/06/2020   HDL 37 (L) 03/06/2020   LDLCALC 144 (H) 03/06/2020   TRIG 229 (H) 03/06/2020   CHOLHDL 6.0 (H) 03/06/2020   Lab Results  Component Value Date   ALT 33 (H) 03/06/2020   AST 21 03/06/2020   ALKPHOS 95 03/06/2020   BILITOT 0.3 03/06/2020    HM: Discussed colonoscopy or cologuard - deferred until health insurance.  Flu vaccine - declines.  Defers shingrix at this time.    History Patient Active Problem List   Diagnosis Date Noted   Well woman exam with routine gynecological exam 05/14/2019   Flu-like symptoms 09/19/2017   Nonintractable headache 09/19/2017   Fever 09/19/2017   Generalized muscle ache 09/19/2017   Past Medical History:  Diagnosis Date   Anemia    Hypertension    Past Surgical History:  Procedure  Laterality Date   BREAST BIOPSY Right 2019   CESAREAN SECTION     INCISION AND DRAINAGE ABSCESS Right 04/10/2018   Procedure: INCISION AND DRAINAGE OF 2 RIGHT BREAST ABSCESSES X2;  Surgeon: Manus Rudd, MD;  Location: [facility];  Service: General;  Laterality: Right;   TUBAL LIGATION Bilateral 11/26/2015   Procedure: POST PARTUM TUBAL LIGATION;  Surgeon: Catalina Antigua, MD;  Location: [facility];  Service: Gynecology;  Laterality: Bilateral;   No Known Allergies Prior to Admission medications   Medication Sig Start Date End Date Taking? Authorizing Provider  lisinopril (ZESTRIL) 10 MG tablet Take 1 tablet (10 mg total) by mouth daily. 03/06/20  Yes Shade Flood, MD   Social History   Socioeconomic History   Marital status: Married    Spouse name: Not on file   Number of children: Not on file   Years of education: Not on file   Highest education level: Not on file  Occupational History   Not on file  Tobacco Use   Smoking status: Never   Smokeless tobacco: Never  Vaping Use   Vaping Use: Never used  Substance and Sexual Activity   Alcohol use: No   Drug use: No   Sexual activity: Yes    Birth control/protection: Surgical  Other Topics Concern   Not on file  Social History Narrative   Not on file   Social Determinants of Health   Financial Resource Strain: Not on file  Food Insecurity: Not on file  Transportation Needs: No Transportation Needs   Lack of Transportation (Medical): No   Lack of Transportation (Non-Medical): No  Physical Activity: Not on file  Stress: Not on file  Social Connections: Not on file  Intimate Partner Violence: Not on file    Review of Systems  Constitutional:  Negative for fatigue and unexpected weight change.  Respiratory:  Negative for chest tightness and shortness of breath.   Cardiovascular:  Negative for chest pain, palpitations and leg swelling.  Gastrointestinal:  Negative for abdominal pain and blood in stool.  Neurological:   Negative for dizziness, syncope, light-headedness and headaches.    Objective:   Vitals:   04/08/21 1359  BP: 122/80  Pulse: 84  Resp: 16  Temp: 98.3 F (36.8 C)  TempSrc: Temporal  SpO2: 97%  Weight: 146 lb 3.2 oz (66.3 kg)  Height: 5\' 3"  (1.6 m)     Physical Exam Vitals reviewed.  Constitutional:      Appearance: Normal appearance. She is well-developed.  HENT:     Head: Normocephalic and atraumatic.  Eyes:     Conjunctiva/sclera: Conjunctivae normal.     Pupils: Pupils are equal, round, and reactive to light.  Neck:     Vascular: No carotid bruit.  Cardiovascular:     Rate and Rhythm: Normal rate and regular rhythm.     Heart sounds: Normal heart sounds.  Pulmonary:     Effort: Pulmonary effort is normal.     Breath sounds: Normal breath sounds.  Abdominal:     Palpations: Abdomen is soft. There is no pulsatile mass.     Tenderness: There is no abdominal tenderness.  Musculoskeletal:     Right lower leg: No edema.     Left lower leg: No edema.  Skin:    General: Skin is warm and dry.  Neurological:     Mental Status: She is alert and oriented to person, place, and time.  Psychiatric:        Mood and Affect: Mood normal.        Behavior: Behavior normal.       Assessment & Plan:  Christy Hernandez is a 50 y.o. female . Hyperlipidemia, unspecified hyperlipidemia type - Plan: Lipid panel, Comprehensive metabolic panel Check labs, then ASCVD risk score.  Dietary advice given including decreased fried food, sodas.  Essential hypertension - Plan: lisinopril (ZESTRIL) 10 MG tablet  -Stable on current dose lisinopril, check labs, continue same regimen.  Recheck 1 year if stable.  Meds ordered this encounter  Medications   lisinopril (ZESTRIL) 10 MG tablet    Sig: Take 1 tablet (10 mg total) by mouth daily.    Dispense:  90 tablet    Refill:  3   Patient Instructions  Cut back on fried food and try to avoid sodas - water is best.  No med  changes for now.   Colesterol elevado High Cholesterol El colesterol elevado es una afeccin que se caracteriza porque la sangre tiene niveles altos de una sustancia Ramsay, cerosa y parecida a la grasa (colesterol). El hgado fabrica todo el colesterol que el organismo necesita. El organismo humano necesita pequeas cantidades de colesterol para formar las clulas. El exceso de colesterol se obtiene de los alimentos que se consumen. La sangre transporta el colesterol desde el hgado al resto del cuerpo.  Si tiene el colesterol elevado, pueden acumularse depsitos (placas) en las paredes de las arterias. Las arterias son los vasos sanguneos que transportan la sangre desde el corazn al resto del cuerpo. Las placas causan que las arterias se Armed forces training and education officer y se endurezcan. Las placas de colesterol aumentan el riesgo de sufrir un infarto de miocardio y un accidente cerebrovascular. Trabaje con el mdico para [employer] Corporation concentraciones de colesterol en un rango saludable. Qu incrementa el riesgo? Los siguientes factores pueden hacer que sea ms propenso a Aeronautical engineer afeccin: Consumir alimentos con alto contenido de grasa animal (grasa saturada) o colesterol. Tener sobrepeso. No hacer suficiente ejercicio fsico. Tener antecedentes familiares de colesterol alto (hipercolesterolemia familiar). Consumir productos con tabaco. Tener diabetes. Cules son los signos o sntomas? En la [employer], el colesterol alto no causa ningn sntoma por lo general. En los [place] graves, los niveles muy altos de colesterol pueden causar: Protuberancias de grasa debajo de la piel (xantomas). Un anillo blanco o gris alrededor del centro negro (pupila) del ojo. Cmo se diagnostica? Esta afeccin se puede diagnosticar en funcin de los [address] de un anlisis de [place]. Si es mayor de 20 aos, es posible que el mdico le controle el nivel de colesterol cada 4 a 6 aos. Los controles pueden ser ms  frecuentes si tiene el colesterol elevado u otros factores de riesgo de enfermedad cardaca. En el anlisis de sangre de [place], se determina lo siguiente: El colesterol "malo", o colesterol LDL. Este es el principal tipo de colesterol que causa enfermedades cardacas. El nivel recomendado es de menos de 100 mg/dl (3.22 mmol/l). El colesterol "bueno", o colesterol HDL. El HDL ayuda a proteger contra la enfermedad cardaca porque limpia las arterias y arrastra el LDL al hgado para que lo procese. El nivel recomendado de HDL es de 60 mg/dl (0.25 mmol/l) o ms. Triglicridos. Estos son grasas que el organismo puede Academic librarian o quemar como fuente de Alex. El nivel recomendado es de menos de 150 mg/dl (4.27 mmol/l). Colesterol total. Mide la cantidad total de colesterol en la sangre, e incluye el colesterol LDL, el colesterol HDL y los triglicridos. El nivel recomendado es de menos de 200 mg/dl (0.62 mmol/l). Cmo se trata? El tratamiento para el colesterol alto comienza con cambios en el estilo de vida, tales como dieta y ejercicio. Cambios en la dieta. Es posible que le indiquen que consuma alimentos con ms [place] y menos grasas saturadas o Engineer, mining. Cambios en el estilo de vida. Estos pueden incluir hacer actividad fsica con regularidad, mantener un peso saludable y dejar de consumir productos con tabaco. Medicamentos. Estos se administran cuando los [employer] dieta y en el estilo de vida no han sido eficaces. Es posible que le receten medicamentos llamados estatinas para bajar sus niveles de colesterol. Siga estas instrucciones en su casa: Comida y bebida  Siga una dieta saludable y [place]. Esta dieta incluye lo siguiente: Porciones diarias de frutas y verduras frescas, congeladas o enlatadas. Porciones diarias de alimentos integrales con alto contenido de [place]. Alimentos con bajo contenido de grasas saturadas y grasas trans. Estos incluyen carne de ave y pescado sin piel,  cortes de carne magros y productos lcteos descremados. Una variedad de pescado, especialmente pescado graso que contenga cidos grasos omega 3. Propngase comer pescado al [employer] veces por semana. Evite los alimentos y las bebidas que tengan Engineer, mining. Use mtodos de coccin saludables, como asar, Software engineer, hervir, hornear, escalfar, cocer al vapor y Actor. No fra  los alimentos excepto para saltearlos. Si bebe alcohol: Limite la cantidad que bebe a lo siguiente: De 0 a 1 medida por da para las mujeres que no estn embarazadas. De 0 a 2 medidas por da para los hombres. Sepa cunta cantidad de alcohol hay en las bebidas. En los [address], una medida equivale a una botella de cerveza de 12 oz (355 ml), un vaso de vino de 5 oz (148 ml) o un vaso de una bebida alcohlica de alta graduacin de 1 oz (44 ml). Estilo de vida  Haga ejercicio con regularidad. Trate de hacer un total de 150 minutos de actividad fsica por semana. Aumente la cantidad de ejercicio fsico que realiza mediante actividades como la jardinera, Gaffer a Advertising account planner o usar las escaleras. No consuma ningn producto que contenga nicotina o tabaco. Estos productos incluyen cigarrillos, tabaco para Theatre manager y aparatos de vapeo, como los Administrator, Civil Service. Si necesita ayuda para dejar de consumir estos productos, consulte al American Express. Indicaciones generales Use los medicamentos de venta libre y los recetados solamente como se lo haya indicado el mdico. Oceanographer a todas las visitas de seguimiento. Esto es importante. Dnde buscar ms informacin American Heart Association (Asociacin Estadounidense del Corazn): www.heart.org National Heart, Lung, and Blood Institute ([employer] [place], los Pulmones y Risk manager): [email or web address].is Comunquese con un mdico si: Tiene dificultad para alcanzar o mantener una alimentacin sana y un peso saludable. Est por comenzar un programa de ejercicios. No puede dejar de  fumar. Solicite ayuda de inmediato si: Midwife. Tiene dificultad para respirar. Tiene molestias o dolor en la [place], el cuello, la espalda, los hombros o [place]. Tiene sntomas de un accidente cerebrovascular. "BE FAST" es una manera fcil de recordar los principales signos de advertencia de un accidente cerebrovascular: B: Balance (equilibrio). Los signos son mareos, dificultad repentina para caminar o prdida del equilibrio. E - Eyes (ojos). Los signos son problemas para ver o un cambio repentino en la visin. F: Face (rostro). Los signos son debilidad repentina o entumecimiento del rostro, o el rostro o el prpado que se caen hacia un lado. A: Arms (brazos). Los signos son debilidad o entumecimiento en un brazo. Esto sucede de repente y generalmente en un lado del cuerpo. S: Speech (habla). Los signos son dificultad para hablar, hablar arrastrando las palabras o dificultad para comprender lo que las Forensic scientist. T: Time (tiempo). Es tiempo de llamar al servicio de Sports administrator. Anote la hora a la que [employer] sntomas. Presenta otros signos de un accidente cerebrovascular, como los siguientes: Dolor de cabeza sbito e intenso que no tiene causa aparente. Nuseas o vmitos. Convulsiones. Estos sntomas pueden representar un problema grave que constituye Radio broadcast assistant. No espere a ver si los sntomas desaparecen. Solicite atencin mdica de inmediato. Comunquese con el servicio de emergencias de su localidad (911 en los [place]). No conduzca por sus propios medios [employer]. Resumen Las placas de colesterol aumentan el riesgo de sufrir un infarto de miocardio y un accidente cerebrovascular. Trabaje con el mdico para [employer] Corporation concentraciones de colesterol en un rango saludable. Siga una dieta saludable y equilibrada, haga ejercicio con regularidad y Shan Levans un peso saludable. No consuma ningn producto que contenga nicotina o tabaco. Estos  productos incluyen cigarrillos, tabaco para Theatre manager y aparatos de vapeo, como los Administrator, Civil Service. Obtenga ayuda de inmediato si tiene cualquier sntoma de un accidente cerebrovascular. Esta informacin no tiene Theme park manager el consejo del mdico. Asegrese de hacerle al  mdico cualquier pregunta que tenga. Document Revised: 09/21/2020 Document Reviewed: 09/21/2020 Elsevier Patient Education  2022 Elsevier Inc.   Cmo controlar su hipertensin Managing Your Hypertension La hipertensin, tambin conocida como presin arterial alta, se produce cuando la sangre ejerce presin contra las paredes de las arterias con demasiada fuerza. Las arterias son los vasos sanguneos que transportan la sangre desde el corazn hacia todas las partes del cuerpo. La hipertensin hace que el corazn haga ms esfuerzo para Marine scientist y puede provocar que las arterias se Armed forces training and education officer o Multimedia programmer. Qu significan las lecturas de la presin arterial La presin arterial deseada puede variar en funcin de las enfermedades, la edad y otros factores personales. Una lectura de la presin arterial consta de un nmero ms alto sobre un nmero ms bajo. En condiciones ideales, la presin arterial debe estar por debajo de 120/80. Es importante que conozca lo siguiente: El Gaffer, o nmero superior, es la presin sistlica. Es la medida de la presin de las arterias cuando el corazn late. El segundo nmero, o nmero inferior, es la presin diastlica. Es la medida de la presin en las arterias cuando el corazn se relaja. La presin arterial se clasifica en cuatro etapas. Sobre la base de la lectura de su presin arterial, el mdico puede usar las siguientes etapas para determinar si necesita tratamiento y de qu tipo. La presin sistlica y la presin diastlica se miden en una unidad llamada mm Hg. Normales Presin sistlica: por debajo de 120. Presin diastlica: por debajo de 80. Elevada Presin  sistlica: 120-129. Presin diastlica: por debajo de 80. Etapa 1 de hipertensin Presin sistlica: 130-139. Presin diastlica: 80-89. Etapa 2 de hipertensin Presin sistlica: 140 o ms. Presin diastlica: 90 o ms. Cmo puede afectarme esta enfermedad? Controlar la hipertensin es una responsabilidad importante. Con el transcurso del [place], la hipertensin puede daar las arterias y Engineer, manufacturing systems flujo de sangre hacia partes importantes del cuerpo que incluyen el cerebro, el corazn y los riones. Tener hipertensin no tratada o no controlada puede causar: Infarto de miocardio. Accidente cerebrovascular. Debilitamiento de los vasos sanguneos (aneurisma). Insuficiencia cardaca. Dao renal. Dao ocular. Sndrome metablico. Problemas de memoria y concentracin. Demencia vascular. Qu medidas puedo tomar para controlar esta afeccin? La hipertensin se puede controlar haciendo [employer] estilo de vida y, posiblemente, tomando medicamentos. Su mdico le ayudar a crear un plan para bajar la presin arterial al rango normal. Nutricin  Siga una dieta con alto contenido de fibras y [place], y con bajo contenido de sal (sodio), azcar agregada y grasas. Un ejemplo de plan alimenticio es la dieta sobre mtodos alimenticios para detener la hipertensin llamada DASH (Dietary Approaches to Stop Hypertension). Para alimentarse de esta manera: Coma mucha fruta y verdura fresca. Trate de que la mitad del plato de cada comida sea de frutas y verduras. Coma cereales integrales, como pasta integral, arroz integral o pan integral. Llene aproximadamente un cuarto del plato con cereales integrales. Consuma productos lcteos descremados. Evite la ingesta de cortes de carne grasa, carne procesada o curada, y carne de ave con piel. Llene aproximadamente un cuarto del plato con protenas magras, como pescado, pollo sin piel, frijoles, huevos o tofu. Evite ingerir alimentos prehechos y procesados.  En general, estos tienen mayor cantidad de sodio, azcar agregada y Steffanie [place]. Reduzca su ingesta diaria de sodio. La mayora de las personas que tienen hipertensin deben comer menos de 1500 mg de sodio por [employer]. Estilo de vida  Trabaje con su mdico para Technical brewer  peso saludable o perder The [employer]. Pregntele cul es el peso recomendado para usted. Realice al menos 30 minutos de ejercicio que haga que se acelere su corazn (ejercicio Magazine features editor) la [employer]V de la [place]. Estas actividades pueden incluir caminar, nadar o andar en bicicleta. Incluya ejercicios para fortalecer sus msculos (ejercicios de resistencia), como levantamiento de pesas, como parte de su rutina semanal de ejercicios. Intente realizar 30 minutos de este tipo de ejercicios al Kellogg a la [employer]. No consuma ningn producto que contenga nicotina o tabaco, como cigarrillos, cigarrillos electrnicos y tabaco de Theatre manager. Si necesita ayuda para dejar de fumar, consulte al American Express. Controle las enfermedades a largo plazo (crnicas), como el colesterol alto o la diabetes. Identifique sus causas de estrs y encuentre maneras de Charity fundraiser. Esto puede incluir meditacin, respiracin profunda o hacerse tiempo para Arts development officer divertidas. Consumo de alcohol No beba alcohol si: Su mdico le indica no hacerlo. Est embarazada, puede estar embarazada o est tratando de [place]. Si bebe alcohol: Limite la cantidad que bebe: De 0 a 1 medida por da para las mujeres. De 0 a 2 medidas por da para los hombres. Est atento a la cantidad de alcohol que hay en las bebidas que toma. En los [address], una medida equivale a una botella de cerveza de 12 oz (355 ml), un vaso de vino de 5 oz (148 ml) o un vaso de una bebida alcohlica de alta graduacin de 1 oz (44 ml). Medicamentos El mdico puede recetarle medicamentos si los cambios en el estilo de vida no son suficientes para Museum/gallery curator la presin  arterial y si: Su presin arterial sistlica es de 130 o ms. Su presin arterial diastlica es de 80 o ms. Use los medicamentos solamente como se lo haya indicado el mdico. Siga cuidadosamente las indicaciones. Los medicamentos para la presin arterial deben tomarse como se lo haya indicado el mdico. Los medicamentos pierden eficacia al omitir las dosis. El hecho de omitir las dosis tambin [place] el riesgo de otros problemas. Monitoreo Antes de [facility] presin arterial: No fume, no consuma bebidas con cafena ni haga ejercicio dentro de los 30 minutos antes de tomar la medicin. Vaya al bao y vace la vejiga (orine). Permanezca sentado tranquilamente durante al menos 5 minutos antes de tomar las mediciones. Contrlese la presin arterial en su casa segn las indicaciones del mdico. Para hacer esto: Sintese con la espalda recta y con apoyo. Coloque los pies planos en el piso. No se cruce de piernas. Apoye el brazo sobre una superficie plana, como una mesa. Asegrese de que la parte superior del brazo est al nivel del corazn. Cada vez que tome una medicin, tome dos o tres lecturas con un minuto de separacin y [employer]. Posiblemente tambin sea necesario que el mdico le controle la presin arterial de [place] regular. Informacin general Hable con su mdico acerca de la dieta, hbitos de ejercicio y otros factores del estilo de vida que pueden contribuir a la hipertensin. Revise con su mdico todos los medicamentos que toma ya que puede haber efectos secundarios o interacciones. Concurra a todas las visitas tal como se lo haya indicado el mdico. El mdico puede ayudarle a crear y Dawayne Patricia su plan para controlar la presin arterial alta. Dnde buscar ms informacin National Heart, Lung, and Blood Institute (Instituto Nacional del [place], los Pulmones y Risk manager): PopSteam.is American Heart Association (Asociacin Estadounidense del Corazn):  www.heart.org Comunquese con un mdico si: [facility]  que tiene Runner, broadcasting/film/video a los medicamentos que ha tomado. Tiene dolores de cabeza frecuentes (recurrentes). Siente mareos. Tiene hinchazn en los tobillos. Tiene problemas de visin. Solicite ayuda de inmediato si: Siente un dolor de cabeza intenso o confusin. Siente debilidad inusual, adormecimiento o que Hospital doctor. Siente un dolor intenso en el pecho o el abdomen. Vomita repetidas veces. Tiene dificultad para respirar. Estos sntomas pueden representar un problema grave que constituye Radio broadcast assistant. No espere a ver si los sntomas desaparecen. Solicite atencin mdica de inmediato. Comunquese con el servicio de emergencias de su localidad (911 en los [place]). No conduzca por sus propios medios [employer] hospital. Resumen La hipertensin se produce cuando la sangre bombea en las arterias con mucha fuerza. Si esta afeccin no se controla, podra correr riesgo de tener complicaciones graves. La presin arterial deseada puede variar en funcin de las enfermedades, la edad y otros factores personales. Para la [employer], una presin arterial normal es menor que 120/80. La hipertensin se puede controlar mediante cambios en el estilo de vida, tomando medicamentos, o ambas cosas. Los [employer] Corporation estilo de vida para ayudar a Chief Operating Officer la hipertensin incluyen prdida de [place], seguir una dieta saludable, con bajo contenido de sodio, hacer ms ejercicio, dejar de fumar y limitar el consumo de alcohol. Esta informacin no tiene Theme park manager el consejo del mdico. Asegrese de hacerle al mdico cualquier pregunta que tenga. Document Revised: 08/09/2019 Document Reviewed: 08/09/2019 Elsevier Patient Education  2022 Elsevier Inc.       Signed,   Meredith Staggers, MD [facility], [facility] 04/08/21 2:51 PM

## 2021-04-09 LAB — LDL CHOLESTEROL, DIRECT: Direct LDL: 141 mg/dL

## 2021-10-08 ENCOUNTER — Ambulatory Visit (INDEPENDENT_AMBULATORY_CARE_PROVIDER_SITE_OTHER): Payer: Self-pay | Admitting: Family Medicine

## 2021-10-08 ENCOUNTER — Encounter: Payer: Self-pay | Admitting: Family Medicine

## 2021-10-08 VITALS — BP 134/76 | HR 72 | Temp 98.1°F | Resp 17 | Ht 63.0 in | Wt 148.6 lb

## 2021-10-08 DIAGNOSIS — E785 Hyperlipidemia, unspecified: Secondary | ICD-10-CM

## 2021-10-08 DIAGNOSIS — Z130 Encounter for screening for diseases of the blood and blood-forming organs and certain disorders involving the immune mechanism: Secondary | ICD-10-CM

## 2021-10-08 DIAGNOSIS — I10 Essential (primary) hypertension: Secondary | ICD-10-CM

## 2021-10-08 DIAGNOSIS — Z0000 Encounter for general adult medical examination without abnormal findings: Secondary | ICD-10-CM

## 2021-10-08 DIAGNOSIS — Z1211 Encounter for screening for malignant neoplasm of colon: Secondary | ICD-10-CM

## 2021-10-08 DIAGNOSIS — Z131 Encounter for screening for diabetes mellitus: Secondary | ICD-10-CM

## 2021-10-08 LAB — COMPREHENSIVE METABOLIC PANEL
ALT: 29 U/L (ref 0–35)
AST: 20 U/L (ref 0–37)
Albumin: 4.6 g/dL (ref 3.5–5.2)
Alkaline Phosphatase: 70 U/L (ref 39–117)
BUN: 12 mg/dL (ref 6–23)
CO2: 30 mEq/L (ref 19–32)
Calcium: 9.1 mg/dL (ref 8.4–10.5)
Chloride: 106 mEq/L (ref 96–112)
Creatinine, Ser: 0.61 mg/dL (ref 0.40–1.20)
GFR: 103.29 mL/min (ref >60.00)
Glucose, Bld: 92 mg/dL (ref 70–99)
Potassium: 4 mEq/L (ref 3.5–5.1)
Sodium: 141 mEq/L (ref 135–145)
Total Bilirubin: 0.4 mg/dL (ref 0.2–1.2)
Total Protein: 7.1 g/dL (ref 6.0–8.3)

## 2021-10-08 LAB — LIPID PANEL
Cholesterol: 226 mg/dL — ABNORMAL HIGH (ref 0–200)
HDL: 41.4 mg/dL (ref >39.00)
LDL Cholesterol: 152 mg/dL — ABNORMAL HIGH (ref 0–99)
NonHDL: 184.79
Total CHOL/HDL Ratio: 5
Triglycerides: 163 mg/dL — ABNORMAL HIGH (ref 0.0–149.0)
VLDL: 32.6 mg/dL (ref 0.0–40.0)

## 2021-10-08 LAB — HEMOGLOBIN A1C: Hgb A1c MFr Bld: 5.8 % (ref 4.6–6.5)

## 2021-10-08 NOTE — Progress Notes (Signed)
? ?Subjective:  ?Patient ID: Christy Hernandez, female    DOB: 18-Apr-1971  Age: 51 y.o. MRN: [id number] ? ?CC:  ?Chief Complaint  ?Patient presents with  ? Annual Exam  ?  Pt here for physical today, pt reports no questions   ? ? ?HPI ?Christy Hernandez presents for Annual Exam ?Here with [place] interpreter. ? ?Hypertension: ?Lisinopril 38m qd, no side effects.  ?Home readings:none.  ?BP Readings from Last 3 Encounters:  ?10/08/21 134/76  ?04/08/21 122/80  ?06/25/20 (!) 142/98  ? ?Lab Results  ?Component Value Date  ? CREATININE 0.58 04/08/2021  ? ? ?Hyperlipidemia: ?Plan for diet adjustments, repeat testing. Weight up slightly.  ?Diet changes:none since last visit.  ?Exercise:none ?The 10-year ASCVD risk score (Arnett DK, et al., 2019) is: 3.6% ?  Values used to calculate the score: ?    Age: [age]years ?    Sex: Female ?    Is Non-Hispanic African American: No ?    Diabetic: No ?    Tobacco smoker: No ?    Systolic Blood Pressure: 1409mmHg ?    Is BP treated: Yes ?    HDL Cholesterol: 37.4 mg/dL ?    Total Cholesterol: 223 mg/dL ? ?Wt Readings from Last 3 Encounters:  ?10/08/21 148 lb 9.6 oz (67.4 kg)  ?04/08/21 146 lb 3.2 oz (66.3 kg)  ?06/25/20 144 lb 8 oz (65.5 kg)  ? ? ?Lab Results  ?Component Value Date  ? CHOL 223 (H) 04/08/2021  ? HDL 37.40 (L) 04/08/2021  ? LDLCALC 144 (H) 03/06/2020  ? LDLDIRECT 141.0 04/08/2021  ? TRIG 287.0 (H) 04/08/2021  ? CHOLHDL 6 04/08/2021  ? ?Lab Results  ?Component Value Date  ? ALT 40 (H) 04/08/2021  ? AST 25 04/08/2021  ? ALKPHOS 73 04/08/2021  ? BILITOT 0.4 04/08/2021  ? ? ? ?  10/08/2021  ?  8:16 AM 04/08/2021  ?  2:01 PM 06/10/2019  ? 10:27 AM 09/10/2018  ?  8:20 AM 03/08/2018  ?  9:25 AM  ?Depression screen PHQ 2/9  ?Decreased Interest 0 0 0 0 0  ?Down, Depressed, Hopeless 0 0 0 0 0  ?PHQ - 2 Score 0 0 0 0 0  ?Altered sleeping 0      ?Tired, decreased energy 0      ?Change in appetite 0      ?Feeling bad or failure about yourself  0      ?Trouble  concentrating 0      ?Moving slowly or fidgety/restless 0      ?Suicidal thoughts 0      ?PHQ-9 Score 0      ? ? ?Health Maintenance  ?Topic Date Due  ? INFLUENZA VACCINE  10/15/2021 (Originally 02/15/2021)  ? Zoster Vaccines- Shingrix (1 of 2) 01/08/2022 (Originally 01/14/2020)  ? COLONOSCOPY (Pts [phone] coverage will need to be confirmed)  04/08/2022 (Originally 01/14/2015)  ? Hepatitis C Screening  10/09/2022 (Originally 01/14/1988)  ? PAP SMEAR-Modifier  05/13/2022  ? MAMMOGRAM  08/20/2022  ? TETANUS/TDAP  08/26/2025  ? HIV Screening  Completed  ? HPV VACCINES  Aged Out  ?Colon CA screening: ?Screening options with colonoscopy versus Cologuard discussed. Discussed timing of repeat testing intervals if normal, as well as potential need for diagnostic Colonoscopy if positive Cologuard. Understanding expressed, and chose Cologuard - did have some questions about cost out of pocket - advised to call [employer] to review cost.  ? ?Utd on pap, mammogram. ?Shingrix:  declines.  ?Hep c screening: declines.  ? ?Immunization History  ?Administered Date(s) Administered  ? Hepatitis A 04/15/2009, 06/04/2009  ? Hepatitis B 04/15/2009, 06/04/2009  ? MMR 05/20/2006  ? Td 01/09/2006  ? Tdap 08/27/2015  ? ? ?No results found. ?No glasses, no eye issues ? ?Dental:No ? ?Alcohol: none ? ?Tobacco: none ? ?Exercise: ?none ? ?History ?Patient Active Problem List  ? Diagnosis Date Noted  ? Well woman exam with routine gynecological exam 05/14/2019  ? Flu-like symptoms 09/19/2017  ? Nonintractable headache 09/19/2017  ? Fever 09/19/2017  ? Generalized muscle ache 09/19/2017  ? ?Past Medical History:  ?Diagnosis Date  ? Anemia   ? Hypertension   ? ?Past Surgical History:  ?Procedure Laterality Date  ? BREAST BIOPSY Right 2019  ? CESAREAN SECTION    ? INCISION AND DRAINAGE ABSCESS Right 04/10/2018  ? Procedure: INCISION AND DRAINAGE OF 2 RIGHT BREAST ABSCESSES X2;  Surgeon: Donnie Mesa, MD;  Location: [facility];  Service: General;   Laterality: Right;  ? TUBAL LIGATION Bilateral 11/26/2015  ? Procedure: POST PARTUM TUBAL LIGATION;  Surgeon: Mora Bellman, MD;  Location: [facility];  Service: Gynecology;  Laterality: Bilateral;  ? ?No Known Allergies ?Prior to Admission medications   ?Medication Sig Start Date End Date Taking? Authorizing Provider  ?lisinopril (ZESTRIL) 10 MG tablet Take 1 tablet (10 mg total) by mouth daily. 04/08/21  Yes Wendie Agreste, MD  ? ?Social History  ? ?Socioeconomic History  ? Marital status: Married  ?  Spouse name: Not on file  ? Number of children: Not on file  ? Years of education: Not on file  ? Highest education level: Not on file  ?Occupational History  ? Not on file  ?Tobacco Use  ? Smoking status: Never  ? Smokeless tobacco: Never  ?Vaping Use  ? Vaping Use: Never used  ?Substance and Sexual Activity  ? Alcohol use: No  ? Drug use: No  ? Sexual activity: Yes  ?  Birth control/protection: Surgical  ?Other Topics Concern  ? Not on file  ?Social History Narrative  ? Not on file  ? ?Social Determinants of Health  ? ?Financial Resource Strain: Not on file  ?Food Insecurity: Not on file  ?Transportation Needs: Not on file  ?Physical Activity: Not on file  ?Stress: Not on file  ?Social Connections: Not on file  ?Intimate Partner Violence: Not on file  ? ? ?Review of Systems  ?Constitutional:  Negative for fatigue and unexpected weight change.  ?Respiratory:  Negative for chest tightness and shortness of breath.   ?Cardiovascular:  Negative for chest pain, palpitations and leg swelling.  ?Gastrointestinal:  Negative for abdominal pain and blood in stool.  ?Neurological:  Negative for dizziness, syncope, light-headedness and headaches.  ?13 point review of systems per patient health survey noted.  Negative other than as indicated above or in HPI.  ? ? ?Objective:  ? ?Vitals:  ? 10/08/21 0807  ?BP: 134/76  ?Pulse: 72  ?Resp: 17  ?Temp: 98.1 ?F (36.7 ?C)  ?TempSrc: Temporal  ?SpO2: 99%  ?Weight: 148 lb 9.6 oz (67.4 kg)   ?Height: '5\' 3"'  (1.6 m)  ? ? ? ?Physical Exam ?Constitutional:   ?   Appearance: She is well-developed.  ?HENT:  ?   Head: Normocephalic and atraumatic.  ?   Right Ear: External ear normal.  ?   Left Ear: External ear normal.  ?Eyes:  ?   Conjunctiva/sclera: Conjunctivae normal.  ?   Pupils: Pupils  are equal, round, and reactive to light.  ?Neck:  ?   Thyroid: No thyromegaly.  ?Cardiovascular:  ?   Rate and Rhythm: Normal rate and regular rhythm.  ?   Heart sounds: Normal heart sounds. No murmur heard. ?Pulmonary:  ?   Effort: Pulmonary effort is normal. No respiratory distress.  ?   Breath sounds: Normal breath sounds. No wheezing.  ?Abdominal:  ?   General: Bowel sounds are normal.  ?   Palpations: Abdomen is soft.  ?   Tenderness: There is no abdominal tenderness.  ?Musculoskeletal:     ?   General: No tenderness. Normal range of motion.  ?   Cervical back: Normal range of motion and neck supple.  ?Lymphadenopathy:  ?   Cervical: No cervical adenopathy.  ?Skin: ?   General: Skin is warm and dry.  ?   Findings: No rash.  ?Neurological:  ?   Mental Status: She is alert and oriented to person, place, and time.  ?Psychiatric:     ?   Behavior: Behavior normal.     ?   Thought Content: Thought content normal.  ? ? ? ? ? ?Assessment & Plan:  ?Christy Hernandez is a 51 y.o. female . ?Annual physical exam ? - -anticipatory guidance as below in AVS, screening labs above. Health maintenance items as above in HPI discussed/recommended as applicable.  ? ?Hyperlipidemia, unspecified hyperlipidemia type ?Check labs to decide on med need.  Healthy diet options discussed. ? ?Essential hypertension ?Stable with current regimen, continue lisinopril 10 mg ? ?Screening for diabetes mellitus ? ?Screening for deficiency anemia ? ?Screen for colon cancer ?Options discussed above, Cologuard ordered but she will check into cost with [facility]. ? ?No orders of the defined types were placed in this encounter. ? ?Patient  Instructions  ?I will check labs again today. Continue to try to avoid fried food, sugar containing beverages. Walking or other low intensity exercise most days per week with a goal of 150 minutes per week.  ?Call

## 2021-10-08 NOTE — Patient Instructions (Addendum)
I will check labs again today. Continue to try to avoid fried food, sugar containing beverages. Walking or other low intensity exercise most days per week with a goal of 150 minutes per week.  ?Call [employer] about cost of Cologuard.  ?Thanks for coming in today! ? ?Cuidados preventivos en las mujeres de 40 a 17 a?os de edad ?Preventive Care 2-52 Years Old, Female ?Los cuidados preventivos hacen referencia a las opciones en cuanto al estilo de vida y a las visitas al m?dico, las cuales pueden promover la salud y Musician. Las visitas de cuidado preventivo tambi?n se denominan ex?menes de Therapist, sports. ??Qu? puedo esperar para mi visita de cuidado preventivo? ?Asesoramiento ?Su m?dico puede preguntarle acerca de: ?Antecedentes m?dicos, incluidos los siguientes: ?Problemas m?dicos pasados. ?Antecedentes m?dicos familiares. ?Antecedentes de embarazo. ?Salud actual, incluido lo siguiente: ?Ciclo menstrual. ?M?todos anticonceptivos. ?Su bienestar emocional. ?Bienestar en el hogar y las relaciones personales. ?Actividad sexual y salud sexual. ?Estilo de vida, incluido lo siguiente: ?Consumo de alcohol, nicotina, tabaco o drogas. ?Acceso a armas de fuego. ?H?bitos de alimentaci?n, ejercicio y sue?o. ?Su trabajo y Alanreed laboral. ?Uso de pantalla solar. ?Cuestiones de seguridad, como el uso de cintur?n de seguridad y casco de Secondary school teacher. ?Examen f?sico ?El m?dico revisar? lo siguiente: ?Estatura y [place]. Estos pueden usarse para calcular el IMC (?ndice de masa corporal). [facility] es una medici?n que indica si tiene un peso saludable. ?Circunferencia de la cintura. Es una medici?n alrededor de Science writer. Esta medici?n tambi?n indica si tiene un peso saludable y puede ayudar a predecir su riesgo de padecer ciertas enfermedades, como diabetes tipo 2 y presi?n arterial alta. ?Frecuencia card?aca y presi?n arterial. ?Temperatura corporal. ?Piel para Recruitment consultant. ??Qu? vacunas necesito? ?Las vacunas se  aplican a varias edades, seg?n un cronograma. El m?dico le recomendar? vacunas seg?n su edad, sus antecedentes m?dicos, su estilo de vida y otros factores, como los viajes o el lugar donde trabaja. ??Qu? pruebas necesito? ?Pruebas de detecci?n ?El m?dico puede recomendar pruebas de detecci?n de ciertas afecciones. Esto puede incluir: ?Niveles de l?pidos y colesterol. ?Pruebas de detecci?n de la diabetes. Esto se realiza mediante un control del az?car en la sangre (glucosa) despu?s de no haber comido durante un periodo de tiempo (ayuno). ?Examen p?lvico y prueba de Papanicolaou. ?Prueba de hepatitis B. ?Prueba de hepatitis C. ?Prueba del VIH (virus de inmunodeficiencia humana). ?Pruebas de infecciones de transmisi?n sexual (ITS), si est? en riesgo. ?Pruebas de detecci?n de c?ncer de pulm?n. ?Pruebas de detecci?n de c?ncer colorrectal. ?Mamograf?a. Hable con su m?dico sobre cu?ndo debe comenzar a realizarse mamograf?as de [place] regular. Esto depende de si tiene antecedentes familiares de c?ncer de mama o no. ?Pruebas de detecci?n de c?ncer relacionado con las mutaciones del BRCA. Es posible que se las deba realizar si tiene antecedentes de c?ncer de mama, de ovario, de trompas o peritoneal. ?Densitometr?a ?sea. Esto se realiza para detectar osteoporosis. ?Hable con su m?dico [employer], las opciones de tratamiento y, si corresponde, la necesidad de Optometrist m?s pruebas. ?Siga estas instrucciones en su casa: ?Comida y bebida ? ?Siga una dieta que incluya frutas y verduras frescas, cereales integrales, prote?nas magras y productos l?cteos descremados. ?Tome los suplementos vitam?nicos y [employer] se lo haya indicado el m?dico. ?No beba alcohol si: ?Su m?dico le indica no hacerlo. ?Est? embarazada, puede estar embarazada o est? tratando de quedar embarazada. ?Si bebe alcohol: ?Limite la cantidad que consume de 0 a 1 medida por d?a. ?Sepa cu?nta cantidad  de alcohol hay en las bebidas que  toma. En los [place], una medida equivale a una botella de cerveza de 12 oz (355 ml), un vaso de vino de 5 oz (148 ml) o un vaso de una bebida alcoh?lica de alta graduaci?n de 1? oz (44 ml). ?Estilo de vida ?Cep?llese los dientes a la ma?ana y a la noche con pasta dental con fluoruro. Use hilo dental una vez al d?a. ?Haga al menos 30 minutos de ejercicio, 5 o m?s d?as cada semana. ?No consuma ning?n producto que contenga nicotina o tabaco. Estos productos incluyen cigarrillos, tabaco para mascar y aparatos de vapeo, como los cigarrillos electr?nicos. Si necesita ayuda para dejar de fumar, consulte al m?dico. ?No consuma drogas. ?Si es sexualmente activa, practique sexo seguro. Use un cond?n u otra forma de protecci?n para prevenir las infecciones de transmisi?n sexual (ITS). ?Si no desea quedar embarazada, use un m?todo anticonceptivo. Si busca un embarazo, realice una consulta previa al embarazo con el m?dico. ?Tome aspirina ?nicamente como se lo haya indicado el m?dico. Aseg?rese de que comprende qu? cantidad y cu?l presentaci?n debe tomar. Trabaje con el m?dico para averiguar si es seguro y beneficioso para usted tomar aspirina a diario. ?Busque maneras saludables de controlar el estr?s, tales como: ?Meditaci?n, yoga o escuchar m?sica. ?Lleve un diario personal. ?Hable con una persona confiable. ?Pase tiempo con amigos y familiares. ?Minimice la exposici?n a la radiaci?n UV para reducir el riesgo de c?ncer de piel. ?Seguridad ?Usa siempre el cintur?n de seguridad al conducir o viajar en un veh?culo. ?No conduzca: ?Si ha estado bebiendo alcohol. No viaje con un conductor que ha estado bebiendo. ?Si est? cansada o distra?da. ?Mientras est? enviando mensajes de texto. ?Si ha estado usando sustancias o drogas que alteran la funci?n mental. ?Use un casco y otros equipos de protecci?n durante las actividades deportivas. ?Si tiene armas de fuego en su casa, aseg?rese de seguir todos los procedimientos de  seguridad correspondientes. ?Busque ayuda si fue v?ctima de abuso f?sico o abuso sexual. ??Cu?ndo volver? ?Visite al m?dico una vez al a?o para una visita anual de control de bienestar. ?Preg?ntele al m?dico con qu? frecuencia debe realizarse un control de la vista y los dientes. ?Mantenga su esquema de vacunaci?n al d?a. ?Esta informaci?n no tiene como fin reemplazar el consejo del m?dico. Aseg?rese de hacerle al m?dico cualquier pregunta que tenga. ?Document Revised: 01/21/2021 Document Reviewed: 01/21/2021 ?Elsevier Patient Education ? 2022 Elsevier Inc. ? ? ? ?

## 2021-10-14 ENCOUNTER — Telehealth: Payer: Self-pay

## 2021-10-14 NOTE — Telephone Encounter (Signed)
-----   Message from Shade Flood, MD sent at 10/12/2021  1:23 PM EDT ----- ?Call with interpreter: ? ?Cholesterol levels elevated, similar to 6 months ago.  10-year heart disease risk score was not high enough to recommend medications at this time.  Watch diet, exercise, recheck levels in 6 months.  Electrolytes, kidney, liver tests were normal.  Blood sugar barely at prediabetes range.  Recheck level 6 months.  Let me know if there are questions. ?

## 2021-10-15 NOTE — Telephone Encounter (Signed)
Patient called back and I used interpreter service to give patient her lab results ? ?

## 2021-10-29 LAB — COLOGUARD: COLOGUARD: NEGATIVE

## 2022-02-03 ENCOUNTER — Other Ambulatory Visit: Payer: Self-pay

## 2022-02-03 DIAGNOSIS — Z1231 Encounter for screening mammogram for malignant neoplasm of breast: Secondary | ICD-10-CM

## 2022-04-08 ENCOUNTER — Ambulatory Visit (INDEPENDENT_AMBULATORY_CARE_PROVIDER_SITE_OTHER): Payer: Medicaid Other | Admitting: Family Medicine

## 2022-04-08 VITALS — BP 124/60 | HR 78 | Temp 98.2°F | Resp 16 | Ht 63.0 in | Wt 148.0 lb

## 2022-04-08 DIAGNOSIS — I10 Essential (primary) hypertension: Secondary | ICD-10-CM

## 2022-04-08 DIAGNOSIS — E785 Hyperlipidemia, unspecified: Secondary | ICD-10-CM

## 2022-04-08 LAB — COMPREHENSIVE METABOLIC PANEL
ALT: 50 U/L — ABNORMAL HIGH (ref 0–35)
AST: 27 U/L (ref 0–37)
Albumin: 4.2 g/dL (ref 3.5–5.2)
Alkaline Phosphatase: 76 U/L (ref 39–117)
BUN: 12 mg/dL (ref 6–23)
CO2: 29 mEq/L (ref 19–32)
Calcium: 9.1 mg/dL (ref 8.4–10.5)
Chloride: 105 mEq/L (ref 96–112)
Creatinine, Ser: 0.63 mg/dL (ref 0.40–1.20)
GFR: 102.13 mL/min (ref >60.00)
Glucose, Bld: 78 mg/dL (ref 70–99)
Potassium: 4.3 mEq/L (ref 3.5–5.1)
Sodium: 140 mEq/L (ref 135–145)
Total Bilirubin: 0.4 mg/dL (ref 0.2–1.2)
Total Protein: 7.4 g/dL (ref 6.0–8.3)

## 2022-04-08 LAB — LIPID PANEL
Cholesterol: 209 mg/dL — ABNORMAL HIGH (ref 0–200)
HDL: 41.3 mg/dL (ref >39.00)
LDL Cholesterol: 138 mg/dL — ABNORMAL HIGH (ref 0–99)
NonHDL: 167.43
Total CHOL/HDL Ratio: 5
Triglycerides: 146 mg/dL (ref 0.0–149.0)
VLDL: 29.2 mg/dL (ref 0.0–40.0)

## 2022-04-08 MED ORDER — LISINOPRIL 10 MG PO TABS: 10.0000 mg | ORAL_TABLET | Freq: Every day | ORAL | 3 refills | Status: DC

## 2022-04-08 NOTE — Progress Notes (Signed)
Subjective:  Patient ID: Christy Hernandez, female    DOB: 23-Apr-1971  Age: 51 y.o. MRN: [id number]  CC:  Chief Complaint  Patient presents with   Hypertension    No questions from pt     HPI Christy Hernandez presents for   Here with spanish interpreter.   Hypertension: Lisinopril 10mg  qd. No cough or other side effects.  Home readings: none.  BP Readings from Last 3 Encounters:  04/08/22 124/60  10/08/21 134/76  04/08/21 122/80   Lab Results  Component Value Date   CREATININE 0.61 10/08/2021    Hyperlipidemia: No current statin. Diet/exercise approach. No change in diet, but avoiding soda, fatty foods.  The 10-year ASCVD risk score (Arnett DK, et al., 2019) is: 3%   Values used to calculate the score:     Age: 9 years     Sex: Female     Is Non-Hispanic African American: No     Diabetic: No     Tobacco smoker: No     Systolic Blood Pressure: 124 mmHg     Is BP treated: Yes     HDL Cholesterol: 41.4 mg/dL     Total Cholesterol: 226 mg/dL  Wt Readings from Last 3 Encounters:  04/08/22 148 lb (67.1 kg)  10/08/21 148 lb 9.6 oz (67.4 kg)  04/08/21 146 lb 3.2 oz (66.3 kg)    Lab Results  Component Value Date   CHOL 226 (H) 10/08/2021   HDL 41.40 10/08/2021   LDLCALC 152 (H) 10/08/2021   LDLDIRECT 141.0 04/08/2021   TRIG 163.0 (H) 10/08/2021   CHOLHDL 5 10/08/2021   Lab Results  Component Value Date   ALT 29 10/08/2021   AST 20 10/08/2021   ALKPHOS 70 10/08/2021   BILITOT 0.4 10/08/2021    History Patient Active Problem List   Diagnosis Date Noted   Well woman exam with routine gynecological exam 05/14/2019   Flu-like symptoms 09/19/2017   Nonintractable headache 09/19/2017   Fever 09/19/2017   Generalized muscle ache 09/19/2017   Past Medical History:  Diagnosis Date   Anemia    Hypertension    Past Surgical History:  Procedure Laterality Date   BREAST BIOPSY Right 2019   CESAREAN SECTION     INCISION AND DRAINAGE  ABSCESS Right 04/10/2018   Procedure: INCISION AND DRAINAGE OF 2 RIGHT BREAST ABSCESSES X2;  Surgeon: 04/12/2018, MD;  Location: [facility];  Service: General;  Laterality: Right;   TUBAL LIGATION Bilateral 11/26/2015   Procedure: POST PARTUM TUBAL LIGATION;  Surgeon: 01/26/2016, MD;  Location: [facility];  Service: Gynecology;  Laterality: Bilateral;   No Known Allergies Prior to Admission medications   Medication Sig Start Date End Date Taking? Authorizing Provider  lisinopril (ZESTRIL) 10 MG tablet Take 1 tablet (10 mg total) by mouth daily. 04/08/21  Yes 04/10/21, MD   Social History   Socioeconomic History   Marital status: Married    Spouse name: Not on file   Number of children: Not on file   Years of education: Not on file   Highest education level: Not on file  Occupational History   Not on file  Tobacco Use   Smoking status: Never   Smokeless tobacco: Never  Vaping Use   Vaping Use: Never used  Substance and Sexual Activity   Alcohol use: No   Drug use: No   Sexual activity: Yes    Birth control/protection: Surgical  Other Topics Concern  Not on file  Social History Narrative   Not on file   Social Determinants of Health   Financial Resource Strain: Not on file  Food Insecurity: Not on file  Transportation Needs: No Transportation Needs (06/25/2020)   PRAPARE - Hydrologist (Medical): No    Lack of Transportation (Non-Medical): No  Physical Activity: Not on file  Stress: Not on file  Social Connections: Not on file  Intimate Partner Violence: Not on file    Review of Systems  Constitutional:  Negative for fatigue and unexpected weight change.  Respiratory:  Negative for chest tightness and shortness of breath.   Cardiovascular:  Negative for chest pain, palpitations and leg swelling.  Gastrointestinal:  Negative for abdominal pain and blood in stool.  Neurological:  Negative for dizziness, syncope, light-headedness and  headaches.     Objective:   Vitals:   04/08/22 0907  BP: 124/60  Pulse: 78  Resp: 16  Temp: 98.2 F (36.8 C)  TempSrc: Oral  SpO2: 96%  Weight: 148 lb (67.1 kg)  Height: 5\' 3"  (1.6 m)     Physical Exam Vitals reviewed.  Constitutional:      Appearance: Normal appearance. She is well-developed.  HENT:     Head: Normocephalic and atraumatic.  Eyes:     Conjunctiva/sclera: Conjunctivae normal.     Pupils: Pupils are equal, round, and reactive to light.  Neck:     Vascular: No carotid bruit.  Cardiovascular:     Rate and Rhythm: Normal rate and regular rhythm.     Heart sounds: Normal heart sounds.  Pulmonary:     Effort: Pulmonary effort is normal.     Breath sounds: Normal breath sounds.  Abdominal:     Palpations: Abdomen is soft. There is no pulsatile mass.     Tenderness: There is no abdominal tenderness.  Musculoskeletal:     Right lower leg: No edema.     Left lower leg: No edema.  Skin:    General: Skin is warm and dry.  Neurological:     Mental Status: She is alert and oriented to person, place, and time.  Psychiatric:        Mood and Affect: Mood normal.        Behavior: Behavior normal.        Assessment & Plan:  Christy Hernandez is a 51 y.o. female . Hyperlipidemia, unspecified hyperlipidemia type - Plan: Lipid panel, Comprehensive metabolic panel  -Check lipids, CMP.  Continue to watch diet, exercise timing and recommendations discussed.  Recheck in 1 year for physical.  Low ASCVD risk score previously.  Essential hypertension - Plan: lisinopril (ZESTRIL) 10 MG tablet  -Stable, tolerating current regimen, check labs.  1 year follow-up for physical.  Flu vaccine declined.  Meds ordered this encounter  Medications   lisinopril (ZESTRIL) 10 MG tablet    Sig: Take 1 tablet (10 mg total) by mouth daily.    Dispense:  90 tablet    Refill:  3   Patient Instructions  Mismo medicina hoy.     Signed,   Merri Ray,  MD [place] 04/08/22 9:42 AM

## 2022-04-08 NOTE — Patient Instructions (Addendum)
Mismo medicina hoy.

## 2022-04-28 ENCOUNTER — Ambulatory Visit
Admission: RE | Admit: 2022-04-28 | Discharge: 2022-04-28 | Disposition: A | Discharge status: Home / Self Care | Payer: No Typology Code available for payment source | Source: Ambulatory Visit | Attending: Family Medicine | Admitting: Family Medicine

## 2022-04-28 ENCOUNTER — Ambulatory Visit: Payer: Self-pay | Admitting: Hematology and Oncology

## 2022-04-28 VITALS — BP 144/100 | Wt 148.6 lb

## 2022-04-28 DIAGNOSIS — Z1231 Encounter for screening mammogram for malignant neoplasm of breast: Secondary | ICD-10-CM

## 2022-04-28 NOTE — Patient Instructions (Signed)
[place] breast self awareness. Patient did not need a Pap smear today due to last Pap smear was in 2020 per patient. Let her know [facility] will cover Pap smears every 5 years unless has a history of abnormal Pap smears. Referred patient to the [facility] for diagnostic mammogram. Appointment scheduled for 04/28/22. Patient aware of appointment and will be there. Let patient know will follow up with her within the next couple weeks with results. Patient will return to clinic in one year for mammogram and 2025 for pap smear. [name] verbalized understanding.  Melodye Ped, NP @T @ 3:12 PM

## 2022-04-28 NOTE — Progress Notes (Signed)
Ms. Cyril Mourning Jakeia Carreras is a 51 y.o. female who presents to [facility] clinic today with no complaints .    Pap Smear: Pap not smear completed today. Last Pap smear was 05/14/2019 at [facility] clinic and was normal. Per patient has no history of an abnormal Pap smear. Last Pap smear result is not available in Epic.   Physical exam: Breasts Breasts symmetrical. No skin abnormalities bilateral breasts. No nipple retraction bilateral breasts. No nipple discharge bilateral breasts. No lymphadenopathy. No lumps palpated bilateral breasts.       Pelvic/Bimanual Pap is not indicated today    Smoking History: Patient has never smoked and was not referred to quit line.    Patient Navigation: Patient education provided. Access to services provided for patient through [place] interpreter provided. No transportation provided   Colorectal Cancer Screening: Per patient has never had colonoscopy completed No complaints today.    Breast and Cervical Cancer Risk Assessment: Patient does not have family history of breast cancer, known genetic mutations, or radiation treatment to the chest before age 83. Patient does not have history of cervical dysplasia, immunocompromised, or DES exposure in-utero.  Risk Assessment     Risk Scores       04/28/2022 06/25/2020   Last edited by: Claretha Cooper, CMA Royston Bake, CMA   5-year risk: 0.7 % 0.7 %   Lifetime risk: 5.9 % 6.1 %            A: [facility] exam without pap smear No complaints with benign exam.   P: Referred patient to the [facility] for a screening mammogram. Appointment scheduled 04/28/22.  Dayton Scrape A, NP 04/28/2022 3:11 PM

## 2023-03-02 ENCOUNTER — Encounter (INDEPENDENT_AMBULATORY_CARE_PROVIDER_SITE_OTHER): Payer: Self-pay

## 2023-03-06 ENCOUNTER — Other Ambulatory Visit: Payer: Self-pay | Admitting: Family Medicine

## 2023-03-06 DIAGNOSIS — I10 Essential (primary) hypertension: Secondary | ICD-10-CM

## 2023-04-14 ENCOUNTER — Ambulatory Visit (INDEPENDENT_AMBULATORY_CARE_PROVIDER_SITE_OTHER): Payer: Medicaid Other | Admitting: Family Medicine

## 2023-04-14 ENCOUNTER — Encounter: Payer: Self-pay | Admitting: Family Medicine

## 2023-04-14 VITALS — BP 144/92 | HR 69 | Temp 97.8°F | Ht 63.0 in | Wt 144.6 lb

## 2023-04-14 DIAGNOSIS — I10 Essential (primary) hypertension: Secondary | ICD-10-CM | POA: Diagnosis not present

## 2023-04-14 DIAGNOSIS — E785 Hyperlipidemia, unspecified: Secondary | ICD-10-CM

## 2023-04-14 DIAGNOSIS — R7303 Prediabetes: Secondary | ICD-10-CM

## 2023-04-14 DIAGNOSIS — Z0000 Encounter for general adult medical examination without abnormal findings: Secondary | ICD-10-CM | POA: Diagnosis not present

## 2023-04-14 LAB — COMPREHENSIVE METABOLIC PANEL
ALT: 31 U/L (ref 0–35)
AST: 23 U/L (ref 0–37)
Albumin: 4.4 g/dL (ref 3.5–5.2)
Alkaline Phosphatase: 69 U/L (ref 39–117)
BUN: 10 mg/dL (ref 6–23)
CO2: 27 meq/L (ref 19–32)
Calcium: 9.1 mg/dL (ref 8.4–10.5)
Chloride: 106 meq/L (ref 96–112)
Creatinine, Ser: 0.59 mg/dL (ref 0.40–1.20)
GFR: 103.02 mL/min (ref >60.00)
Glucose, Bld: 89 mg/dL (ref 70–99)
Potassium: 4 meq/L (ref 3.5–5.1)
Sodium: 141 meq/L (ref 135–145)
Total Bilirubin: 0.4 mg/dL (ref 0.2–1.2)
Total Protein: 7.3 g/dL (ref 6.0–8.3)

## 2023-04-14 LAB — HEMOGLOBIN A1C: Hgb A1c MFr Bld: 5.7 % (ref 4.6–6.5)

## 2023-04-14 LAB — CBC
HCT: 39.1 % (ref 36.0–46.0)
Hemoglobin: 13.1 g/dL (ref 12.0–15.0)
MCHC: 33.4 g/dL (ref 30.0–36.0)
MCV: 87.4 fL (ref 78.0–100.0)
Platelets: 350 10*3/uL (ref 150.0–400.0)
RBC: 4.47 Mil/uL (ref 3.87–5.11)
RDW: 13.5 % (ref 11.5–15.5)
WBC: 5 10*3/uL (ref 4.0–10.5)

## 2023-04-14 LAB — LIPID PANEL
Cholesterol: 206 mg/dL — ABNORMAL HIGH (ref 0–200)
HDL: 45 mg/dL (ref >39.00)
LDL Cholesterol: 128 mg/dL — ABNORMAL HIGH (ref 0–99)
NonHDL: 160.69
Total CHOL/HDL Ratio: 5
Triglycerides: 161 mg/dL — ABNORMAL HIGH (ref 0.0–149.0)
VLDL: 32.2 mg/dL (ref 0.0–40.0)

## 2023-04-14 LAB — TSH: TSH: 2.42 u[IU]/mL (ref 0.35–5.50)

## 2023-04-14 MED ORDER — LISINOPRIL 20 MG PO TABS: 20.0000 mg | ORAL_TABLET | Freq: Every day | ORAL | 3 refills | Status: DC

## 2023-04-14 NOTE — Addendum Note (Signed)
Addended by: Eldred Manges on: 04/14/2023 10:23 AM   Modules accepted: Orders

## 2023-04-14 NOTE — Progress Notes (Signed)
Subjective:  Patient ID: Christy Hernandez, female    DOB: 1970/11/02  Age: 52 y.o. MRN: [id number]  CC:  Chief Complaint  Patient presents with   Annual Exam    Refills     HPI Christy Hernandez presents for Annual Exam Here with spanish interpreter.  PCP: me  Hypertension: Lisinopril 10 mg daily, last visit September 2023.  Stable at that time. Home readings: none.  No missed doses of meds.  Slight HA at times, soreness in neck. Mild, not persistent or worst HA of life. no other new symptoms.  BP Readings from Last 3 Encounters:  04/14/23 (!) 144/92  04/28/22 (!) 144/100  04/08/22 124/60   Lab Results  Component Value Date   CREATININE 0.63 04/08/2022    Hyperlipidemia: Diet/exercise approach planned given low ASCVD risk score, 3% last year.  No current statin. Walking more- walking daily. - 1 hour per day.  The 10-year ASCVD risk score (Arnett DK, et al., 2019) is: 4%   Values used to calculate the score:     Age: 9 years     Sex: Female     Is Non-Hispanic African American: No     Diabetic: No     Tobacco smoker: No     Systolic Blood Pressure: 144 mmHg     Is BP treated: Yes     HDL Cholesterol: 41.3 mg/dL     Total Cholesterol: 209 mg/dL  Lab Results  Component Value Date   CHOL 209 (H) 04/08/2022   HDL 41.30 04/08/2022   LDLCALC 138 (H) 04/08/2022   LDLDIRECT 141.0 04/08/2021   TRIG 146.0 04/08/2022   CHOLHDL 5 04/08/2022   Lab Results  Component Value Date   ALT 50 (H) 04/08/2022   AST 27 04/08/2022   ALKPHOS 76 04/08/2022   BILITOT 0.4 04/08/2022   Prediabetes: Increased walking as above.  Avoiding soda/sweet tea.  Fast food - none.  Lab Results  Component Value Date   HGBA1C 5.8 10/08/2021   Wt Readings from Last 3 Encounters:  04/14/23 144 lb 9.6 oz (65.6 kg)  04/28/22 148 lb 9.6 oz (67.4 kg)  04/08/22 148 lb (67.1 kg)           04/14/2023    9:37 AM 04/08/2022    9:10 AM 10/08/2021    8:16 AM  04/08/2021    2:01 PM 06/10/2019   10:27 AM  Depression screen PHQ 2/9  Decreased Interest 0 0 0 0 0  Down, Depressed, Hopeless 0 0 0 0 0  PHQ - 2 Score 0 0 0 0 0  Altered sleeping 0  0    Tired, decreased energy 0  0    Change in appetite 0  0    Feeling bad or failure about yourself  0  0    Trouble concentrating 0  0    Moving slowly or fidgety/restless 0  0    Suicidal thoughts 0  0    PHQ-9 Score 0  0      Health Maintenance  Topic Date Due   Hepatitis C Screening  Never done   Colonoscopy  Never done   Zoster Vaccines- Shingrix (1 of 2) Never done   COVID-19 Vaccine (1 - 2023-24 season) Never done   INFLUENZA VACCINE  10/16/2023 (Originally 02/16/2023)   MAMMOGRAM  04/28/2024   Cervical Cancer Screening (HPV/Pap Cotest)  05/13/2024   DTaP/Tdap/Td (3 - Td or Tdap) 08/26/2025   HIV Screening  Completed   HPV VACCINES  Aged Out  Negative Cologuard 10/20/21.   Mammogram 04/28/2022, recommended  Pap 04/24/19 - negative, neg HPV.  Postmenopausal, no bleeding.   Immunization History  Administered Date(s) Administered   Hepatitis A 04/15/2009, 06/04/2009   Hepatitis B 04/15/2009, 06/04/2009   MMR 05/20/2006   Td 01/09/2006   Tdap 08/27/2015  Flu vaccine - recommended, declined.  Covid vaccine - booster recommended at pharmacy.  Shingrix - declines.   No results found. No recent visit - recommended checkup.   Dental: checkup in [place], 2 years ago. Recommended appt - will be going in December.   Alcohol: none  Tobacco: none  Exercise: walking 3min-1 hr per week.    History Patient Active Problem List   Diagnosis Date Noted   Well woman exam with routine gynecological exam 05/14/2019   Flu-like symptoms 09/19/2017   Nonintractable headache 09/19/2017   Fever 09/19/2017   Generalized muscle ache 09/19/2017   Past Medical History:  Diagnosis Date   Anemia    Hypertension    Past Surgical History:  Procedure Laterality Date   BREAST BIOPSY Right 2019    CESAREAN SECTION     INCISION AND DRAINAGE ABSCESS Right 04/10/2018   Procedure: INCISION AND DRAINAGE OF 2 RIGHT BREAST ABSCESSES X2;  Surgeon: Manus Rudd, MD;  Location: [facility] OR;  Service: General;  Laterality: Right;   TUBAL LIGATION Bilateral 11/26/2015   Procedure: POST PARTUM TUBAL LIGATION;  Surgeon: Catalina Antigua, MD;  Location: [facility];  Service: Gynecology;  Laterality: Bilateral;   No Known Allergies Prior to Admission medications   Medication Sig Start Date End Date Taking? Authorizing Provider  lisinopril (ZESTRIL) 10 MG tablet Take 1 tablet by mouth once daily 03/06/23  Yes Shade Flood, MD   Social History   Socioeconomic History   Marital status: Married    Spouse name: Not on file   Number of children: Not on file   Years of education: Not on file   Highest education level: Not on file  Occupational History   Not on file  Tobacco Use   Smoking status: Never   Smokeless tobacco: Never  Vaping Use   Vaping status: Never Used  Substance and Sexual Activity   Alcohol use: No   Drug use: No   Sexual activity: Yes    Birth control/protection: Surgical  Other Topics Concern   Not on file  Social History Narrative   Not on file   Social Determinants of Health   Financial Resource Strain: Not on file  Food Insecurity: No Food Insecurity (04/28/2022)   Hunger Vital Sign    Worried About Running Out of Food in the Last Year: Never true    Ran Out of Food in the Last Year: Never true  Transportation Needs: No Transportation Needs (04/28/2022)   PRAPARE - Administrator, Civil Service (Medical): No    Lack of Transportation (Non-Medical): No  Physical Activity: Not on file  Stress: Not on file  Social Connections: Not on file  Intimate Partner Violence: Not on file    Review of Systems  13 point review of systems per patient health survey noted.  Negative other than as indicated above or in HPI.   Objective:   Vitals:   04/14/23 0930   BP: (!) 144/92  Pulse: 69  Temp: 97.8 F (36.6 C)  TempSrc: Temporal  SpO2: 96%  Weight: 144 lb 9.6 oz (65.6 kg)  Height: 5\' 3"  (1.6 m)  Physical Exam Constitutional:      Appearance: She is well-developed.  HENT:     Head: Normocephalic and atraumatic.     Right Ear: External ear normal.     Left Ear: External ear normal.  Eyes:     Conjunctiva/sclera: Conjunctivae normal.     Pupils: Pupils are equal, round, and reactive to light.  Neck:     Thyroid: No thyromegaly.  Cardiovascular:     Rate and Rhythm: Normal rate and regular rhythm.     Heart sounds: Normal heart sounds. No murmur heard. Pulmonary:     Effort: Pulmonary effort is normal. No respiratory distress.     Breath sounds: Normal breath sounds. No wheezing.  Abdominal:     General: Bowel sounds are normal.     Palpations: Abdomen is soft.     Tenderness: There is no abdominal tenderness.  Musculoskeletal:        General: No tenderness. Normal range of motion.     Cervical back: Normal range of motion and neck supple.  Lymphadenopathy:     Cervical: No cervical adenopathy.  Skin:    General: Skin is warm and dry.     Findings: No rash.  Neurological:     Mental Status: She is alert and oriented to person, place, and time.  Psychiatric:        Behavior: Behavior normal.        Thought Content: Thought content normal.        Assessment & Plan:  Christy Hernandez is a 52 y.o. female . Annual physical exam  - -anticipatory guidance as below in AVS, screening labs above. Health maintenance items as above in HPI discussed/recommended as applicable.   Essential hypertension - Plan: TSH, CBC, lisinopril (ZESTRIL) 20 MG tablet  Decreased control.  Increase lisinopril to 20 mg daily,/side effects discussed, handout given on management of hypertension, 1 month follow-up.  Check labs.  Hyperlipidemia, unspecified hyperlipidemia type - Plan: Comprehensive metabolic panel, Lipid panel  -Low  ASCVD risk score previously.  Commended on diet/exercise and weight loss.  Anticipate improved labs.  Prediabetes - Plan: Hemoglobin A1c  -Mild elevated A1c last year, weight has improved as above.  Anticipate improvement in levels.  Continue diet and exercise approach.  Meds ordered this encounter  Medications   lisinopril (ZESTRIL) 20 MG tablet    Sig: Take 1 tablet (20 mg total) by mouth daily.    Dispense:  90 tablet    Refill:  3   Patient Instructions  Schedule mammogram in October.  Dental visit when in [place] and eye exam also recommended.  Increase lisinopril to 20mg  pill per day and recheck in 1 month.  Keep up the good work with walking.  Take care!  Cmo controlar su hipertensin Managing Your Hypertension La hipertensin, tambin conocida como presin arterial alta, se produce cuando la sangre ejerce presin contra las paredes de las arterias con [place] fuerza. Las arterias son los vasos sanguneos que transportan la sangre desde el corazn hacia todas las partes del cuerpo. La hipertensin hace que el corazn haga ms esfuerzo para Marine scientist y puede provocar que las arterias se Armed forces training and education officer o Multimedia programmer. Qu significan las lecturas de la presin arterial Una lectura de la presin arterial consta de un nmero ms alto sobre un nmero ms bajo. El Gaffer, o nmero superior, es la presin sistlica. Es la medida de la presin de las arterias cuando el corazn late. El [place], o nmero  inferior, es la presin diastlica. Es la medida de la presin en las arterias cuando el corazn se relaja. Para la [employer], [place] presin arterial normal est por debajo de 120/80. La presin arterial deseada puede variar en funcin de las enfermedades, la edad y otros factores personales. La presin arterial se clasifica en cuatro etapas. Sobre la base de la lectura de su presin arterial, el mdico puede usar las siguientes etapas para determinar si necesita  tratamiento y de qu tipo. La presin sistlica y la presin diastlica se miden en una unidad llamada milmetros de mercurio (mm Hg). Normal Presin sistlica: por debajo de 120. Presin diastlica: por debajo de 80. Elev[place] Presin sistlica: 120-129. Presin diastlica: por debajo de 80. Etapa 1 de hipertensin Presin sistlica: 130-139. Presin diastlica: 80-89. Etapa 2 de hipertensin Presin sistlica: 140 o ms. Presin diastlica: 90 o ms. Cmo puede afectarme esta enfermedad? Controlar la hipertensin es Intel. Con el transcurso del [place], la hipertensin puede daar las arterias y Technical sales engineer el flujo de sangre hacia partes del cuerpo que incluyen el cerebro, el corazn y los riones. Tener hipertensin no tratada o no controlada puede causar: Infarto de miocardio. Accidente cerebrovascular. Debilitamiento de los vasos sanguneos (aneurisma). Insuficiencia cardaca. Dao renal. Dao ocular. Problemas de memoria y Librarian, academic. Demencia vascular. Qu medidas puedo tomar para controlar esta afeccin? La hipertensin se puede controlar haciendo [employer] estilo de vida y, posiblemente, tomando medicamentos. Su mdico le ayudar a crear un plan para bajar la presin arterial al rango normal. Es posible que lo deriven para que reciba asesoramiento sobre una dieta saludable y [place] fsica. Nutricin  Siga una dieta con alto contenido de fibras y [place], y con bajo contenido de sal (sodio), azcar agregada y Rosalin Hawking. Un ejemplo de plan de alimentacin se denomina dieta DASH. DASH es la sigla en ingls de "Enfoques alimentarios para detener la hipertensin". Para alimentarse de esta manera: Coma mucha fruta y verdura fresca. Trate de que la mitad del plato de cada comida sea de frutas y verduras. Coma cereales integrales, como pasta integral, arroz integral o pan integral. Llene aproximadamente un cuarto del plato con cereales integrales. Consuma productos  lcteos descremados. Evite la ingesta de cortes de carne grasa, carne procesada o curada, y carne de ave con piel. Llene aproximadamente un cuarto del plato con protenas magras, como pescado, pollo sin piel, frijoles, huevos o tofu. Evite ingerir alimentos prehechos y procesados. En general, estos tienen mayor cantidad de sodio, azcar agregada y Steffanie [place]. Reduzca su ingesta diaria de sodio. Muchas personas que tienen hipertensin deben comer menos de 1500 mg de [employer]. Estilo de vida  Trabaje con su mdico para mantener un peso saludable o Curator. Pregntele cul es el peso recomendado para usted. Realice al menos 30 minutos de ejercicio que haga que se acelere su corazn (ejercicio Magazine features editor) la DIRECTV de la [place]. Estas actividades pueden incluir caminar, nadar o andar en bicicleta. Incluya ejercicios para fortalecer sus msculos (ejercicios de resistencia), como levantamiento de pesas, como parte de su rutina semanal de ejercicios. Intente realizar 30 minutos de este tipo de ejercicios al Kellogg a la [place]. No consuma ningn producto que contenga nicotina o tabaco. Estos productos incluyen cigarrillos, tabaco para Theatre manager y aparatos de vapeo, como los Administrator, Civil Service. Si necesita ayuda para dejar de consumir estos productos, consulte al American Express. Controle las enfermedades a largo plazo (crnicas), como el colesterol alto o la diabetes. Identifique sus  causas de estrs y encuentre maneras de controlar el estrs. Esto puede incluir meditacin, respiracin profunda o hacerse tiempo para Arts development officer divertidas. Consumo de alcohol No beba alcohol si: Su mdico le indica no hacerlo. Est embarazada, puede estar embarazada o est tratando de [place]. Si bebe alcohol: Limite la cantidad que bebe a lo siguiente: De 0 a 1 medida por da para las mujeres. De 0 a 2 medidas por da para los hombres. Sepa cunta cantidad de alcohol hay en las  bebidas que toma. En los [address], una medida equivale a una botella de cerveza de 12 oz (355 ml), un vaso de vino de 5 oz (148 ml) o un vaso de una bebida alcohlica de alta graduacin de 1 oz (44 ml). Medicamentos El mdico puede recetarle medicamentos si los cambios en el estilo de vida no son suficientes para Museum/gallery curator la presin arterial y si: Su presin arterial sistlica es de 130 o ms. Su presin arterial diastlica es de 80 o ms. Use los medicamentos solamente como se lo haya indicado el mdico. Siga cuidadosamente las indicaciones. Los medicamentos para la presin arterial deben tomarse como se lo haya indicado el mdico. Los medicamentos pierden eficacia al omitir las dosis. El hecho de omitir las dosis tambin [place] el riesgo de otros problemas. Monitoreo Antes de [employer] presin arterial: No fume, no consuma bebidas con cafena ni haga ejercicio dentro de los 30 minutos antes de tomar la medicin. Vaya al bao y vace la vejiga (orine). Permanezca sentado tranquilamente durante al menos 5 minutos antes de tomar las mediciones. Contrlese la presin arterial en su casa segn las indicaciones del mdico. Para hacer esto: Sintese con la espalda recta y con apoyo. Coloque los pies planos en el piso. No se cruce de piernas. Apoye el brazo sobre una superficie plana, como una mesa. Asegrese de que la parte superior del brazo est al nivel del corazn. Cada vez que tome una medicin, tome dos o tres lecturas con un minuto de separacin y [employer] Brands. Posiblemente tambin sea necesario que el mdico le controle la presin arterial de [place] regular. Informacin general Hable con su mdico acerca de la dieta, hbitos de ejercicio y otros factores del estilo de vida que pueden contribuir a la hipertensin. Revise con su mdico todos los medicamentos que toma ya que puede haber efectos secundarios o interacciones. Concurra a todas las visitas de seguimiento.  El mdico puede ayudarle a crear y Dawayne Patricia su plan para controlar la presin arterial alta. Dnde obtener ms informacin [employer]'s, Lung, and Blood Institute (Instituto Nacional del [place], los Pulmones y Risk manager): PopSteam.is American Heart Association (Asociacin Estadounidense del Corazn): www.heart.org Comunquese con un mdico si: Piensa que tiene Runner, broadcasting/film/video a los medicamentos que ha tomado. Tiene dolores de cabeza frecuentes (recurrentes). Siente mareos. Tiene hinchazn en los tobillos. Tiene problemas de visin. Solicite ayuda de inmediato si: Siente un dolor de cabeza intenso o confusin. Siente debilidad inusual, adormecimiento o que Hospital doctor. Siente un dolor intenso en el pecho o el abdomen. Vomita repetidas veces. Tiene dificultad para respirar. Estos sntomas pueden Customer service manager. Solicite ayuda de inmediato. Llame al 911. No espere a ver si los sntomas desaparecen. No conduzca por sus propios medios [employer] hospital. Resumen La hipertensin se produce cuando la sangre bombea en las arterias con mucha fuerza. Si esta afeccin no se controla, podra correr riesgo de tener complicaciones graves. La presin arterial deseada puede variar en funcin de  las enfermedades, la edad y otros factores personales. Para la [employer], una presin arterial normal es menor que 120/80. La hipertensin se puede controlar mediante cambios en el estilo de vida, tomando medicamentos, o ambas cosas. Los [employer] Corporation estilo de vida para ayudar a Chief Operating Officer la hipertensin incluyen prdida de [place], seguir una dieta saludable, con bajo contenido de sodio, hacer ms ejercicio, dejar de fumar y limitar el consumo de alcohol. Esta informacin no tiene Theme park manager el consejo del mdico. Asegrese de hacerle al mdico cualquier pregunta que tenga. Document Revised: 04/12/2021 Document Reviewed: 04/12/2021 Elsevier Patient Education  2024  Elsevier Inc.   Cuidados preventivos en las mujeres de 40 a 64 aos de edad Preventive Care 51-34 Years Old, Female Los cuidados preventivos hacen referencia a las opciones en cuanto al estilo de vida y a las visitas al mdico, las cuales pueden promover la salud y Counsellor. Las visitas de cuidado preventivo tambin se denominan exmenes de Health visitor. Qu puedo esperar para mi visita de cuidado preventivo? Asesoramiento Su mdico puede preguntarle acerca de: Antecedentes mdicos, incluidos los siguientes: Problemas mdicos pasados. Antecedentes mdicos familiares. Antecedentes de embarazo. Salud actual, incluido lo siguiente: [place] menstrual. Mtodos anticonceptivos. Su bienestar emocional. Training and development officer y las relaciones personales. Actividad sexual y salud sexual. Doran Clay de vida, incluido lo siguiente: Consumo de alcohol, nicotina, tabaco o drogas. Acceso a armas de fuego. Hbitos de alimentacin, ejercicio y sueo. Su trabajo y [place] laboral. Uso de pantalla solar. Cuestiones de seguridad, como el uso de cinturn de seguridad y casco de Scientist, research (physical sciences). Examen fsico El mdico revisar lo siguiente: Diplomatic Services operational officer y [place]. Estos pueden usarse para calcular el IMC (ndice de masa corporal). El [facility] es una medicin que indica si tiene un peso saludable. Circunferencia de la cintura. Es Neomia Dear medicin alrededor de Lobbyist. Esta medicin tambin indica si tiene un peso saludable y puede ayudar a predecir su riesgo de padecer ciertas enfermedades, como diabetes tipo 2 y presin arterial alta. Frecuencia cardaca y presin arterial. Temperatura corporal. Piel para detectar manchas anormales. Qu vacunas necesito?  Las vacunas se aplican a varias edades, segn un cronograma. El [employer] recomendar vacunas segn su edad, sus antecedentes mdicos, su estilo de vida y [address], como los viajes o el lugar donde trabaja. Qu pruebas necesito? Pruebas de deteccin El mdico  puede recomendar pruebas de deteccin de ciertas afecciones. Esto puede incluir: Niveles de lpidos y colesterol. Pruebas de deteccin de la diabetes. Esto se Physiological scientist un control del azcar en la sangre (glucosa) despus de no haber comido durante un periodo de tiempo (ayuno). Examen plvico y prueba de Papanicolaou. Prueba de hepatitis B. Prueba de hepatitis C. Prueba del VIH (virus de inmunodeficiencia humana). Pruebas de infecciones de transmisin sexual (ITS), si est en riesgo. Pruebas de deteccin de cncer de pulmn. Pruebas de deteccin de cncer colorrectal. Mamografa. Hable con su mdico sobre cundo debe comenzar a Health and safety inspector de [place] regular. Esto depende de si tiene antecedentes familiares de cncer de mama o no. Pruebas de deteccin de cncer relacionado con las mutaciones del BRCA. Es posible que se las deba realizar si tiene antecedentes de cncer de mama, de ovario, de trompas o peritoneal. Densitometra sea. Esto se realiza para detectar osteoporosis. Hable con su mdico [employer], las opciones de tratamiento y, si corresponde, la necesidad de Education officer, environmental ms pruebas. Siga estas instrucciones en su casa: Comida y bebida  Siga una dieta que  incluya frutas y verduras frescas, cereales integrales, protenas magras y productos lcteos descremados. Tome los suplementos vitamnicos y [employer] se lo haya indicado el mdico. No beba alcohol si: Su mdico le indica no hacerlo. Est embarazada, puede estar embarazada o est tratando de [place]. Si bebe alcohol: Limite la cantidad que consume de 0 a 1 medida por da. Sepa cunta cantidad de alcohol hay en las bebidas que toma. En los [address], una medida equivale a una botella de cerveza de 12 oz (355 ml), un vaso de vino de 5 oz (148 ml) o un vaso de una bebida alcohlica de alta graduacin de 1 oz (44 ml). Estilo de [employer] dientes a la maana y a la  noche con Conservator, museum/gallery con fluoruro. Use hilo dental una vez al da. Haga al menos 30 minutos de ejercicio, 5 o ms [address]. No consuma ningn producto que contenga nicotina o tabaco. Estos productos incluyen cigarrillos, tabaco para Theatre manager y aparatos de vapeo, como los Administrator, Civil Service. Si necesita ayuda para dejar de fumar, consulte al mdico. No consuma drogas. Si es sexualmente activa, practique sexo seguro. Use un condn u otra forma de proteccin para prevenir las infecciones de transmisin sexual (ITS). Si no desea quedar embarazada, use un mtodo anticonceptivo. Si busca un embarazo, realice una consulta previa al [employer] con el mdico. Tome aspirina nicamente como se lo haya indicado el mdico. Asegrese de que comprende qu cantidad y cul presentacin debe tomar. Trabaje con el mdico para averiguar si es seguro y beneficioso para usted tomar aspirina a diario. Busque maneras saludables de Charity fundraiser, tales como: Meditacin, yoga o Optometrist. Lleve un diario personal. Hable con una persona confiable. Pase tiempo con amigos y familiares. Minimice la exposicin a la radiacin UV para reducir el riesgo de cncer de piel. Seguridad [place] siempre el cinturn de seguridad al conducir o viajar en un vehculo. No conduzca: Si ha estado bebiendo alcohol. No viaje con un conductor que ha estado bebiendo. Si est cansada o distrada. Mientras est enviando mensajes de texto. Si ha estado usando sustancias o drogas que alteran la funcin mental. Use un casco y otros equipos de proteccin durante las actividades deportivas. Si tiene armas de fuego en su casa, asegrese de seguir todos los procedimientos de seguridad correspondientes. Busque ayuda si fue vctima de abuso fsico o abuso sexual. Cundo volver? Visite al mdico una vez al ao para una visita anual de control de bienestar. Pregntele al mdico con qu frecuencia debe realizarse un control de la  vista y los dientes. Mantenga su esquema de vacunacin al da. Esta informacin no tiene Theme park manager el consejo del mdico. Asegrese de hacerle al mdico cualquier pregunta que tenga. Document Revised: 01/21/2021 Document Reviewed: 01/21/2021 Elsevier Patient Education  2024 Elsevier Inc.      Signed,   Meredith Staggers, MD [facility], [facility] 04/14/23 10:16 AM

## 2023-04-14 NOTE — Patient Instructions (Addendum)
Schedule mammogram in October.  Dental visit when in [place] and eye exam also recommended.  Increase lisinopril to 20mg  pill per day and recheck in 1 month.  Keep up the good work with walking.  Take care!  Cmo controlar su hipertensin Managing Your Hypertension La hipertensin, tambin conocida como presin arterial alta, se produce cuando la sangre ejerce presin contra las paredes de las arterias con [place] fuerza. Las arterias son los vasos sanguneos que transportan la sangre desde el corazn hacia todas las partes del cuerpo. La hipertensin hace que el corazn haga ms esfuerzo para Marine scientist y puede provocar que las arterias se Armed forces training and education officer o Multimedia programmer. Qu significan las lecturas de la presin arterial Una lectura de la presin arterial consta de un nmero ms alto sobre un nmero ms bajo. El Gaffer, o nmero superior, es la presin sistlica. Es la medida de la presin de las arterias cuando el corazn late. El segundo nmero, o nmero inferior, es la presin diastlica. Es la medida de la presin en las arterias cuando el corazn se relaja. Para la [employer], [place] presin arterial normal est por debajo de 120/80. La presin arterial deseada puede variar en funcin de las enfermedades, la edad y otros factores personales. La presin arterial se clasifica en cuatro etapas. Sobre la base de la lectura de su presin arterial, el mdico puede usar las siguientes etapas para determinar si necesita tratamiento y de qu tipo. La presin sistlica y la presin diastlica se miden en una unidad llamada milmetros de mercurio (mm Hg). Normal Presin sistlica: por debajo de 120. Presin diastlica: por debajo de 80. Elevada Presin sistlica: 120-129. Presin diastlica: por debajo de 80. Etapa 1 de hipertensin Presin sistlica: 130-139. Presin diastlica: 80-89. Etapa 2 de hipertensin Presin sistlica: 140 o ms. Presin diastlica: 90 o ms. Cmo  puede afectarme esta enfermedad? Controlar la hipertensin es Intel. Con el transcurso del [place], la hipertensin puede daar las arterias y Technical sales engineer el flujo de sangre hacia partes del cuerpo que incluyen el cerebro, el corazn y los riones. Tener hipertensin no tratada o no controlada puede causar: Infarto de miocardio. Accidente cerebrovascular. Debilitamiento de los vasos sanguneos (aneurisma). Insuficiencia cardaca. Dao renal. Dao ocular. Problemas de memoria y Librarian, academic. Demencia vascular. Qu medidas puedo tomar para controlar esta afeccin? La hipertensin se puede controlar haciendo [employer] estilo de vida y, posiblemente, tomando medicamentos. Su mdico le ayudar a crear un plan para bajar la presin arterial al rango normal. Es posible que lo deriven para que reciba asesoramiento sobre una dieta saludable y [place] fsica. Nutricin  Siga una dieta con alto contenido de fibras y [place], y con bajo contenido de sal (sodio), azcar agregada y Rosalin Hawking. Un ejemplo de plan de alimentacin se denomina dieta DASH. DASH es la sigla en ingls de "Enfoques alimentarios para detener la hipertensin". Para alimentarse de esta manera: Coma mucha fruta y verdura fresca. Trate de que la mitad del plato de cada comida sea de frutas y verduras. Coma cereales integrales, como pasta integral, arroz integral o pan integral. Llene aproximadamente un cuarto del plato con cereales integrales. Consuma productos lcteos descremados. Evite la ingesta de cortes de carne grasa, carne procesada o curada, y carne de ave con piel. Llene aproximadamente un cuarto del plato con protenas magras, como pescado, pollo sin piel, frijoles, huevos o tofu. Evite ingerir alimentos prehechos y procesados. En general, estos tienen mayor cantidad de sodio, azcar agregada y Steffanie Rain[place]. Reduzca su ingesta  diaria de sodio. Muchas personas que tienen hipertensin deben comer menos de 1500 mg de [employer]. Estilo de vida  Trabaje con su mdico para mantener un peso saludable o Curator. Pregntele cul es el peso recomendado para usted. Realice al menos 30 minutos de ejercicio que haga que se acelere su corazn (ejercicio Magazine features editor) la DIRECTV de la [place]. Estas actividades pueden incluir caminar, nadar o andar en bicicleta. Incluya ejercicios para fortalecer sus msculos (ejercicios de resistencia), como levantamiento de pesas, como parte de su rutina semanal de ejercicios. Intente realizar 30 minutos de este tipo de ejercicios al Kellogg a la [place]. No consuma ningn producto que contenga nicotina o tabaco. Estos productos incluyen cigarrillos, tabaco para Theatre manager y aparatos de vapeo, como los Administrator, Civil Service. Si necesita ayuda para dejar de consumir estos productos, consulte al American [employer]. Controle las enfermedades a largo plazo (crnicas), como el colesterol alto o la diabetes. Identifique sus causas de estrs y encuentre maneras de Charity fundraiser. Esto puede incluir meditacin, respiracin profunda o hacerse tiempo para Arts development officer divertidas. Consumo de alcohol No beba alcohol si: Su mdico le indica no hacerlo. Est embarazada, puede estar embarazada o est tratando de [place]. Si bebe alcohol: Limite la cantidad que bebe a lo siguiente: De 0 a 1 medida por da para las mujeres. De 0 a 2 medidas por da para los hombres. Sepa cunta cantidad de alcohol hay en las bebidas que toma. En los [address], una medida equivale a una botella de cerveza de 12 oz (355 ml), un vaso de vino de 5 oz (148 ml) o un vaso de una bebida alcohlica de alta graduacin de 1 oz (44 ml). Medicamentos El mdico puede recetarle medicamentos si los cambios en el estilo de vida no son suficientes para Museum/gallery curator la presin arterial y si: Su presin arterial sistlica es de 130 o ms. Su presin arterial diastlica es de 80 o ms. Use los medicamentos  solamente como se lo haya indicado el mdico. Siga cuidadosamente las indicaciones. Los medicamentos para la presin arterial deben tomarse como se lo haya indicado el mdico. Los medicamentos pierden eficacia al omitir las dosis. El hecho de omitir las dosis tambin [place] el riesgo de otros problemas. Monitoreo Antes de [employer] presin arterial: No fume, no consuma bebidas con cafena ni haga ejercicio dentro de los 30 minutos antes de tomar la medicin. Vaya al bao y vace la vejiga (orine). Permanezca sentado tranquilamente durante al menos 5 minutos antes de tomar las mediciones. Contrlese la presin arterial en su casa segn las indicaciones del mdico. Para hacer esto: Sintese con la espalda recta y con apoyo. Coloque los pies planos en el piso. No se cruce de piernas. Apoye el brazo sobre una superficie plana, como una mesa. Asegrese de que la parte superior del brazo est al nivel del corazn. Cada vez que tome una medicin, tome dos o tres lecturas con un minuto de separacin y [employer] Brands. Posiblemente tambin sea necesario que el mdico le controle la presin arterial de [place] regular. Informacin general Hable con su mdico acerca de la dieta, hbitos de ejercicio y otros factores del estilo de vida que pueden contribuir a la hipertensin. Revise con su mdico todos los medicamentos que toma ya que puede haber efectos secundarios o interacciones. Concurra a todas las visitas de seguimiento. El mdico puede ayudarle a crear y Dawayne Patricia su plan para controlar la presin arterial alta. Dnde obtener  ms informacin National Heart, Lung, and Blood Institute (Instituto Nacional del [place], los Pulmones y Risk manager): PopSteam.is American Heart Association (Asociacin Estadounidense del Corazn): www.heart.org Comunquese con un mdico si: Piensa que tiene Runner, broadcasting/film/video a los medicamentos que ha tomado. Tiene dolores de cabeza frecuentes  (recurrentes). Siente mareos. Tiene hinchazn en los tobillos. Tiene problemas de visin. Solicite ayuda de inmediato si: Siente un dolor de cabeza intenso o confusin. Siente debilidad inusual, adormecimiento o que Hospital doctor. Siente un dolor intenso en el pecho o el abdomen. Vomita repetidas veces. Tiene dificultad para respirar. Estos sntomas pueden Customer service manager. Solicite ayuda de inmediato. Llame al 911. No espere a ver si los sntomas desaparecen. No conduzca por sus propios medios [employer] hospital. Resumen La hipertensin se produce cuando la sangre bombea en las arterias con mucha fuerza. Si esta afeccin no se controla, podra correr riesgo de tener complicaciones graves. La presin arterial deseada puede variar en funcin de las enfermedades, la edad y otros factores personales. Para la [employer], una presin arterial normal es menor que 120/80. La hipertensin se puede controlar mediante cambios en el estilo de vida, tomando medicamentos, o ambas cosas. Los [employer] estilo de vida para ayudar a Chief Operating Officer la hipertensin incluyen prdida de [place], seguir una dieta saludable, con bajo contenido de sodio, hacer ms ejercicio, dejar de fumar y limitar el consumo de alcohol. Esta informacin no tiene Theme park manager el consejo del mdico. Asegrese de hacerle al mdico cualquier pregunta que tenga. Document Revised: 04/12/2021 Document Reviewed: 04/12/2021 Elsevier Patient Education  2024 Elsevier Inc.   Cuidados preventivos en las mujeres de 40 a 64 aos de edad Preventive Care 79-51 Years Old, Female Los cuidados preventivos hacen referencia a las opciones en cuanto al estilo de vida y a las visitas al mdico, las cuales pueden promover la salud y Counsellor. Las visitas de cuidado preventivo tambin se denominan exmenes de Health visitor. Qu puedo esperar para mi visita de cuidado preventivo? Asesoramiento Su mdico puede preguntarle acerca  de: Antecedentes mdicos, incluidos los siguientes: Problemas mdicos pasados. Antecedentes mdicos familiares. Antecedentes de embarazo. Salud actual, incluido lo siguiente: [place] menstrual. Mtodos anticonceptivos. Su bienestar emocional. Training and development officer y las relaciones personales. Actividad sexual y salud sexual. Doran Clay de vida, incluido lo siguiente: Consumo de alcohol, nicotina, tabaco o drogas. Acceso a armas de fuego. Hbitos de alimentacin, ejercicio y sueo. Su trabajo y [place] laboral. Uso de pantalla solar. Cuestiones de seguridad, como el uso de cinturn de seguridad y casco de Scientist, research (physical sciences). Examen fsico El mdico revisar lo siguiente: Diplomatic Services operational officer y [place]. Estos pueden usarse para calcular el IMC (ndice de masa corporal). El [facility] es una medicin que indica si tiene un peso saludable. Circunferencia de la cintura. Es Neomia Dear medicin alrededor de Lobbyist. Esta medicin tambin indica si tiene un peso saludable y puede ayudar a predecir su riesgo de padecer ciertas enfermedades, como diabetes tipo 2 y presin arterial alta. Frecuencia cardaca y presin arterial. Temperatura corporal. Piel para detectar manchas anormales. Qu vacunas necesito?  Las vacunas se aplican a varias edades, segn un cronograma. El [employer] recomendar vacunas segn su edad, sus antecedentes mdicos, su estilo de vida y [address], como los viajes o el lugar donde trabaja. Qu pruebas necesito? Pruebas de deteccin El mdico puede recomendar pruebas de deteccin de ciertas afecciones. Esto puede incluir: Niveles de lpidos y colesterol. Pruebas de deteccin de la diabetes. Esto se realiza mediante un control del  azcar en la sangre (glucosa) despus de no haber comido durante un periodo de tiempo (ayuno). Examen plvico y prueba de Papanicolaou. Prueba de hepatitis B. Prueba de hepatitis C. Prueba del VIH (virus de inmunodeficiencia humana). Pruebas de infecciones de transmisin  sexual (ITS), si est en riesgo. Pruebas de deteccin de cncer de pulmn. Pruebas de deteccin de cncer colorrectal. Mamografa. Hable con su mdico sobre cundo debe comenzar a Health and safety inspector de [place] regular. Esto depende de si tiene antecedentes familiares de cncer de mama o no. Pruebas de deteccin de cncer relacionado con las mutaciones del BRCA. Es posible que se las deba realizar si tiene antecedentes de cncer de mama, de ovario, de trompas o peritoneal. Densitometra sea. Esto se realiza para detectar osteoporosis. Hable con su mdico [employer], las opciones de tratamiento y, si corresponde, la necesidad de Education officer, environmental ms pruebas. Siga estas instrucciones en su casa: Comida y bebida  Siga una dieta que incluya frutas y verduras frescas, cereales integrales, protenas magras y productos lcteos descremados. Tome los suplementos vitamnicos y [employer] se lo haya indicado el mdico. No beba alcohol si: Su mdico le indica no hacerlo. Est embarazada, puede estar embarazada o est tratando de [place]. Si bebe alcohol: Limite la cantidad que consume de 0 a 1 medida por da. Sepa cunta cantidad de alcohol hay en las bebidas que toma. En los [address], una medida equivale a una botella de cerveza de 12 oz (355 ml), un vaso de vino de 5 oz (148 ml) o un vaso de una bebida alcohlica de alta graduacin de 1 oz (44 ml). Estilo de [employer] dientes a la maana y a la noche con Conservator, museum/gallery con fluoruro. Use hilo dental una vez al da. Haga al menos 30 minutos de ejercicio, 5 o ms [address]. No consuma ningn producto que contenga nicotina o tabaco. Estos productos incluyen cigarrillos, tabaco para Theatre manager y aparatos de vapeo, como los Administrator, Civil Service. Si necesita ayuda para dejar de fumar, consulte al mdico. No consuma drogas. Si es sexualmente activa, practique sexo seguro. Use un condn u otra forma de  proteccin para prevenir las infecciones de transmisin sexual (ITS). Si no desea quedar embarazada, use un mtodo anticonceptivo. Si busca un embarazo, realice una consulta previa al [employer] con el mdico. Tome aspirina nicamente como se lo haya indicado el mdico. Asegrese de que comprende qu cantidad y cul presentacin debe tomar. Trabaje con el mdico para averiguar si es seguro y beneficioso para usted tomar aspirina a diario. Busque maneras saludables de Charity fundraiser, tales como: Meditacin, yoga o Optometrist. Lleve un diario personal. Hable con una persona confiable. Pase tiempo con amigos y familiares. Minimice la exposicin a la radiacin UV para reducir el riesgo de cncer de piel. Seguridad [place] siempre el cinturn de seguridad al conducir o viajar en un vehculo. No conduzca: Si ha estado bebiendo alcohol. No viaje con un conductor que ha estado bebiendo. Si est cansada o distrada. Mientras est enviando mensajes de texto. Si ha estado usando sustancias o drogas que alteran la funcin mental. Use un casco y otros equipos de proteccin durante las actividades deportivas. Si tiene armas de fuego en su casa, asegrese de seguir todos los procedimientos de seguridad correspondientes. Busque ayuda si fue vctima de abuso fsico o abuso sexual. Cundo volver? Visite al mdico una vez al ao para una visita anual de control de bienestar. Pregntele al mdico con qu frecuencia debe  realizarse un control de la vista y los dientes. Mantenga su esquema de vacunacin al da. Esta informacin no tiene Theme park manager el consejo del mdico. Asegrese de hacerle al mdico cualquier pregunta que tenga. Document Revised: 01/21/2021 Document Reviewed: 01/21/2021 Elsevier Patient Education  2024 [employer].

## 2023-04-18 ENCOUNTER — Telehealth: Payer: Self-pay

## 2023-04-18 NOTE — Telephone Encounter (Signed)
Patient daughter called back and we discussed, no concerns

## 2023-04-18 NOTE — Telephone Encounter (Signed)
-----   Message from Shade Flood sent at 04/18/2023  1:53 PM EDT ----- Call patient with Spanish interpreter  blood counts, thyroid test, electrolytes, kidney, liver tests looked okay.  Cholesterol levels were slightly elevated but a few points better than last year.  At current levels I do not recommend any new medicines but let me know if there are questions.

## 2023-05-18 ENCOUNTER — Ambulatory Visit (INDEPENDENT_AMBULATORY_CARE_PROVIDER_SITE_OTHER): Payer: Medicaid Other | Admitting: Family Medicine

## 2023-05-18 VITALS — BP 134/78 | HR 77 | Temp 98.5°F | Ht 63.0 in | Wt 145.0 lb

## 2023-05-18 DIAGNOSIS — I10 Essential (primary) hypertension: Secondary | ICD-10-CM | POA: Diagnosis not present

## 2023-05-18 NOTE — Progress Notes (Signed)
Subjective:  Patient ID: Christy Hernandez, female    DOB: [birth year]  Age: 52 y.o. MRN: [id number]  CC:  Chief Complaint  Patient presents with   Hypertension    Pt notes doing okay no physical sxs this month     HPI Christy Hernandez presents for   Hypertension: Follow up from last visit.  No side effects with lisinopril 20mg . Higher dose since 9/27 visit. Home readings: BP Readings from Last 3 Encounters:  05/18/23 134/78  04/14/23 (!) 144/92  04/28/22 (!) 144/100   Lab Results  Component Value Date   CREATININE 0.59 04/14/2023    History Patient Active Problem List   Diagnosis Date Noted   Well woman exam with routine gynecological exam 05/14/2019   Flu-like symptoms 09/19/2017   Nonintractable headache 09/19/2017   Fever 09/19/2017   Generalized muscle ache 09/19/2017   Past Medical History:  Diagnosis Date   Anemia    Hypertension    Past Surgical History:  Procedure Laterality Date   BREAST BIOPSY Right 2019   CESAREAN SECTION     INCISION AND DRAINAGE ABSCESS Right 04/10/2018   Procedure: INCISION AND DRAINAGE OF 2 RIGHT BREAST ABSCESSES X2;  Surgeon: Manus Rudd, MD;  Location: [facility] OR;  Service: General;  Laterality: Right;   TUBAL LIGATION Bilateral 11/26/2015   Procedure: POST PARTUM TUBAL LIGATION;  Surgeon: Catalina Antigua, MD;  Location: [facility];  Service: Gynecology;  Laterality: Bilateral;   No Known Allergies Prior to Admission medications   Medication Sig Start Date End Date Taking? Authorizing Provider  lisinopril (ZESTRIL) 20 MG tablet Take 1 tablet (20 mg total) by mouth daily. 04/14/23  Yes Shade Flood, MD   Social History   Socioeconomic History   Marital status: Married    Spouse name: Not on file   Number of children: Not on file   Years of education: Not on file   Highest education level: Not on file  Occupational History   Not on file  Tobacco Use   Smoking status: Never   Smokeless tobacco: Never   Vaping Use   Vaping status: Never Used  Substance and Sexual Activity   Alcohol use: No   Drug use: No   Sexual activity: Yes    Birth control/protection: Surgical  Other Topics Concern   Not on file  Social History Narrative   Not on file   Social Determinants of Health   Financial Resource Strain: Not on file  Food Insecurity: No Food Insecurity (04/28/2022)   Hunger Vital Sign    Worried About Running Out of Food in the Last Year: Never true    Ran Out of Food in the Last Year: Never true  Transportation Needs: No Transportation Needs (04/28/2022)   PRAPARE - Administrator, Civil Service (Medical): No    Lack of Transportation (Non-Medical): No  Physical Activity: Not on file  Stress: Not on file  Social Connections: Not on file  Intimate Partner Violence: Not on file    Review of Systems  Constitutional:  Negative for fatigue and unexpected weight change.  Respiratory:  Negative for chest tightness and shortness of breath.   Cardiovascular:  Negative for chest pain, palpitations and leg swelling.  Gastrointestinal:  Negative for abdominal pain and blood in stool.  Neurological:  Negative for dizziness, syncope, light-headedness and headaches.     Objective:   Vitals:   05/18/23 1014  BP: 134/78  Pulse: 77  Temp: 98.5  F (36.9 C)  TempSrc: Temporal  SpO2: 98%  Weight: 145 lb (65.8 kg)  Height: 5\' 3"  (1.6 m)     Physical Exam Vitals reviewed.  Constitutional:      Appearance: Normal appearance. She is well-developed.  HENT:     Head: Normocephalic and atraumatic.  Eyes:     Conjunctiva/sclera: Conjunctivae normal.     Pupils: Pupils are equal, round, and reactive to light.  Neck:     Vascular: No carotid bruit.  Cardiovascular:     Rate and Rhythm: Normal rate and regular rhythm.     Heart sounds: Normal heart sounds.  Pulmonary:     Effort: Pulmonary effort is normal.     Breath sounds: Normal breath sounds.  Abdominal:      Palpations: Abdomen is soft. There is no pulsatile mass.     Tenderness: There is no abdominal tenderness.  Musculoskeletal:     Right lower leg: No edema.     Left lower leg: No edema.  Skin:    General: Skin is warm and dry.  Neurological:     Mental Status: She is alert and oriented to person, place, and time.  Psychiatric:        Mood and Affect: Mood normal.        Behavior: Behavior normal.        Assessment & Plan:  Christy Hernandez is a 52 y.o. female . Essential hypertension Tolerating higher dose lisinopril with improved control, continue same, check labs next visit.  No orders of the defined types were placed in this encounter.  There are no Patient Instructions on file for this visit.    Signed,   Meredith Staggers, MD [facility], [facility] 05/18/23 11:03 AM

## 2023-06-02 ENCOUNTER — Telehealth: Payer: Self-pay

## 2023-06-02 NOTE — Telephone Encounter (Signed)
Telephoned patient using interpreter#[phone]. Patient has private insurance, advised patient to call [facility] and schedule mammogram appointment. [facility]

## 2023-06-06 ENCOUNTER — Other Ambulatory Visit: Payer: Self-pay | Admitting: Family Medicine

## 2023-06-06 DIAGNOSIS — I10 Essential (primary) hypertension: Secondary | ICD-10-CM

## 2023-06-10 ENCOUNTER — Encounter (HOSPITAL_COMMUNITY): Payer: Self-pay

## 2023-06-10 ENCOUNTER — Ambulatory Visit (HOSPITAL_COMMUNITY)
Admission: EM | Admit: 2023-06-10 | Discharge: 2023-06-10 | Disposition: A | Discharge status: Home / Self Care | Payer: Medicaid Other | Attending: Emergency Medicine | Admitting: Emergency Medicine

## 2023-06-10 DIAGNOSIS — M436 Torticollis: Secondary | ICD-10-CM

## 2023-06-10 MED ORDER — TRIAMCINOLONE ACETONIDE 40 MG/ML IJ SUSP
40.0000 mg | Freq: Once | INTRAMUSCULAR | Status: AC
  Administered 2023-06-10: 40 mg via INTRAMUSCULAR

## 2023-06-10 MED ORDER — TRIAMCINOLONE ACETONIDE 40 MG/ML IJ SUSP
INTRAMUSCULAR | Status: AC
  Filled 2023-06-10: qty 1

## 2023-06-10 MED ORDER — METHOCARBAMOL 500 MG PO TABS: 500.0000 mg | ORAL_TABLET | Freq: Two times a day (BID) | ORAL | 0 refills | Status: DC

## 2023-06-10 NOTE — ED Triage Notes (Signed)
Patient here today with c/o neck pain X 2 months. Worse on the left side but both side hurt. She has tried taking Advil with no relief.

## 2023-06-10 NOTE — Discharge Instructions (Addendum)
Le hemos administrado una inyeccin de esteroides en la clnica para Engineer, materials y los espasmos. Tambin puede tomar Art therapist veces al da. No beba ni conduzca mientras toma este medicamento, ya que puede causar somnolencia. Contine con las compresas tibias y los estiramientos suaves. Si el dolor de cuello no mejora con estas intervenciones, consulte con su mdico de cabecera o con un ortopedista. Su presin arterial estaba elevada hoy en la clnica, esto podra deberse al dolor. Consulte con su mdico de cabecera para controlar su hipertensin.  We have given you a steroid injection in clinic to help with your pain and spasming.  You can also take the muscle relaxer twice daily, do not drink or drive on this medication as it may cause drowsiness.  Continue warm compress and gentle stretching.  If the neck pain does not improve with these interventions please follow-up with your primary care provider or an orthopedic.  Your blood pressure was elevated today in clinic, this could be due to pain.  Follow-up with your primary care provider regarding your hypertension management.

## 2023-06-10 NOTE — ED Provider Notes (Addendum)
[facility]    CSN: [id number] Arrival date & time: 06/10/23  1126      History   Chief Complaint Chief Complaint  Patient presents with   Torticollis    HPI Christy Hernandez is a 52 y.o. female.   Patient presents to clinic complaining of severe left-sided neck pain that started upon wakening this morning.  She has been having neck pain and stiffness for the past few months, but it recently got much worse.  At home she has been doing gentle stretching and using warm rags without much relief.  She has not had any recent trauma, injuries or falls.  She is able to move her neck, but this causes pain.  No numbness, weakness or tingling.  Denies any fevers or recent illnesses.  Reports compliance with her lisinopril, took it this morning prior to arrival.  Daughter to provide interpretation, declined medical language interpretor.   The history is provided by the patient and medical records. The history is limited by a language barrier.    Past Medical History:  Diagnosis Date   Anemia    Hypertension     Patient Active Problem List   Diagnosis Date Noted   Well woman exam with routine gynecological exam 05/14/2019   Flu-like symptoms 09/19/2017   Nonintractable headache 09/19/2017   Fever 09/19/2017   Generalized muscle ache 09/19/2017    Past Surgical History:  Procedure Laterality Date   BREAST BIOPSY Right 2019   CESAREAN SECTION     INCISION AND DRAINAGE ABSCESS Right 04/10/2018   Procedure: INCISION AND DRAINAGE OF 2 RIGHT BREAST ABSCESSES X2;  Surgeon: Manus Rudd, MD;  Location: [facility];  Service: General;  Laterality: Right;   TUBAL LIGATION Bilateral 11/26/2015   Procedure: POST PARTUM TUBAL LIGATION;  Surgeon: Catalina Antigua, MD;  Location: [facility];  Service: Gynecology;  Laterality: Bilateral;    OB History     Gravida  6   Para  5   Term  5   Preterm  0   AB  1   Living  5      SAB  1   IAB  0   Ectopic  0    Multiple  0   Live Births  5            Home Medications    Prior to Admission medications   Medication Sig Start Date End Date Taking? Authorizing Provider  lisinopril (ZESTRIL) 20 MG tablet Take 1 tablet (20 mg total) by mouth daily. 04/14/23  Yes Shade Flood, MD  methocarbamol (ROBAXIN) 500 MG tablet Take 1 tablet (500 mg total) by mouth 2 (two) times daily. 06/10/23  Yes Chauncey Sciulli, [place] N, FNP    Family History Family History  Problem Relation Age of Onset   Hypertension Mother    Hypertension Father    Hypertension Sister    Breast cancer Neg Hx     Social History Social History   Tobacco Use   Smoking status: Never   Smokeless tobacco: Never  Vaping Use   Vaping status: Never Used  Substance Use Topics   Alcohol use: No   Drug use: No     Allergies   Patient has no known allergies.   Review of Systems Review of Systems  Per HPI   Physical Exam Triage Vital Signs ED Triage Vitals  Encounter Vitals Group     BP 06/10/23 1241 (!) 161/104     Systolic BP Percentile --  Diastolic BP Percentile --      Pulse Rate 06/10/23 1241 99     Resp 06/10/23 1241 16     Temp 06/10/23 1241 98.8 F (37.1 C)     Temp Source 06/10/23 1241 Oral     SpO2 06/10/23 1241 97 %     Weight 06/10/23 1240 145 lb (65.8 kg)     Height 06/10/23 1240 5' (1.524 m)     Head Circumference --      Peak Flow --      Pain Score 06/10/23 1239 10     Pain Loc --      Pain Education --      Exclude from Growth Chart --    No data found.  Updated Vital Signs BP (!) 161/104 (BP Location: Left Arm)   Pulse 99   Temp 98.8 F (37.1 C) (Oral)   Resp 16   Ht 5' (1.524 m)   Wt 145 lb (65.8 kg)   LMP 09/09/2017   SpO2 97%   BMI 28.32 kg/m   Visual Acuity Right Eye Distance:   Left Eye Distance:   Bilateral Distance:    Right Eye Near:   Left Eye Near:    Bilateral Near:     Physical Exam Vitals and nursing note reviewed.  Constitutional:       Appearance: Normal appearance.  HENT:     Head: Normocephalic and atraumatic.     Right Ear: External ear normal.     Left Ear: External ear normal.     Nose: Nose normal.     Mouth/Throat:     Mouth: Mucous membranes are moist.  Eyes:     Conjunctiva/sclera: Conjunctivae normal.  Neck:      Comments: Cervical spine without step-off or deformity.  No weakness, numbness or tingling.  Left-sided muscular tenderness to palpation and with range of motion. Cardiovascular:     Rate and Rhythm: Normal rate and regular rhythm.     Heart sounds: Normal heart sounds. No murmur heard. Pulmonary:     Effort: Pulmonary effort is normal. No respiratory distress.     Breath sounds: Normal breath sounds.  Musculoskeletal:        General: Tenderness present. Normal range of motion.     Cervical back: Torticollis and tenderness present. Pain with movement and muscular tenderness present. No spinous process tenderness.  Skin:    General: Skin is warm and dry.  Neurological:     General: No focal deficit present.     Mental Status: She is alert.  Psychiatric:        Mood and Affect: Mood normal.        Behavior: Behavior is cooperative.      UC Treatments / Results  Labs (all labs ordered are listed, but only abnormal results are displayed) Labs Reviewed - No data to display  EKG   Radiology No results found.  Procedures Procedures (including critical care time)  Medications Ordered in UC Medications  triamcinolone acetonide (KENALOG-40) injection 40 mg (40 mg Intramuscular Given 06/10/23 1320)    Initial Impression / Assessment and Plan / UC Course  I have reviewed the triage vital signs and the nursing notes.  Pertinent labs & imaging results that were available during my care of the patient were reviewed by me and considered in my medical decision making (see chart for details).  Vitals and triage reviewed, patient is hemodynamically stable.  Left sided neck pain appears  muscular in nature.  Without red flag symptoms of trauma, cervical spine step-off or deformity.  No numbness or weakness.  Will trial IM steroid injection and muscle relaxers. Encouraged to continue symptomatic management.  Patient's blood pressure remains elevated on recheck, has been well-controlled at her PCP visits on the 20 mg of lisinopril.  Suspect elevation due to pain since patient took blood pressure medication this morning.  Encourage PCP follow-up.  Plan of care, follow-up care return precautions given, no questions at this time.    Final Clinical Impressions(s) / UC Diagnoses   Final diagnoses:  Torticollis     Discharge Instructions      Le hemos administrado una inyeccin de esteroides en la clnica para Engineer, materials y los espasmos. Tambin puede tomar Art therapist veces al da. No beba ni conduzca mientras toma este medicamento, ya que puede causar somnolencia. Contine con las compresas tibias y los estiramientos suaves. Si el dolor de cuello no mejora con estas intervenciones, consulte con su mdico de cabecera o con un ortopedista. Su presin arterial estaba elevada hoy en la clnica, esto podra deberse al dolor. Consulte con su mdico de cabecera para controlar su hipertensin.  We have given you a steroid injection in clinic to help with your pain and spasming.  You can also take the muscle relaxer twice daily, do not drink or drive on this medication as it may cause drowsiness.  Continue warm compress and gentle stretching.  If the neck pain does not improve with these interventions please follow-up with your primary care provider or an orthopedic.  Your blood pressure was elevated today in clinic, this could be due to pain.  Follow-up with your primary care provider regarding your hypertension management.      ED Prescriptions     Medication Sig Dispense Auth. Provider   methocarbamol (ROBAXIN) 500 MG tablet Take 1 tablet (500 mg total) by mouth 2  (two) times daily. 20 tablet Deke Tilghman, [place] N, [place]      PDMP not reviewed this encounter.   Finch Costanzo, [place] N, FNP 06/10/23 1320    Nova Evett, [place], [place] 06/10/23 1331

## 2023-08-16 ENCOUNTER — Other Ambulatory Visit: Payer: Self-pay | Admitting: Family Medicine

## 2023-08-16 DIAGNOSIS — Z1231 Encounter for screening mammogram for malignant neoplasm of breast: Secondary | ICD-10-CM

## 2023-08-18 ENCOUNTER — Ambulatory Visit
Admission: RE | Admit: 2023-08-18 | Discharge: 2023-08-18 | Disposition: A | Discharge status: Home / Self Care | Payer: Medicaid Other | Source: Ambulatory Visit | Attending: Family Medicine | Admitting: Family Medicine

## 2023-08-18 DIAGNOSIS — Z1231 Encounter for screening mammogram for malignant neoplasm of breast: Secondary | ICD-10-CM

## 2023-10-20 ENCOUNTER — Ambulatory Visit (INDEPENDENT_AMBULATORY_CARE_PROVIDER_SITE_OTHER): Payer: Medicaid Other | Admitting: Family Medicine

## 2023-10-20 ENCOUNTER — Telehealth: Payer: Self-pay

## 2023-10-20 ENCOUNTER — Encounter: Payer: Self-pay | Admitting: Family Medicine

## 2023-10-20 VITALS — BP 154/88 | HR 93 | Temp 98.6°F | Ht 60.0 in | Wt 142.6 lb

## 2023-10-20 DIAGNOSIS — E785 Hyperlipidemia, unspecified: Secondary | ICD-10-CM | POA: Diagnosis not present

## 2023-10-20 DIAGNOSIS — R7303 Prediabetes: Secondary | ICD-10-CM | POA: Diagnosis not present

## 2023-10-20 DIAGNOSIS — I10 Essential (primary) hypertension: Secondary | ICD-10-CM | POA: Diagnosis not present

## 2023-10-20 LAB — COMPREHENSIVE METABOLIC PANEL WITH GFR
ALT: 23 U/L (ref 0–35)
AST: 19 U/L (ref 0–37)
Albumin: 4.6 g/dL (ref 3.5–5.2)
Alkaline Phosphatase: 64 U/L (ref 39–117)
BUN: 9 mg/dL (ref 6–23)
CO2: 29 meq/L (ref 19–32)
Calcium: 9.2 mg/dL (ref 8.4–10.5)
Chloride: 104 meq/L (ref 96–112)
Creatinine, Ser: 0.54 mg/dL (ref 0.40–1.20)
GFR: 104.86 mL/min (ref >60.00)
Glucose, Bld: 86 mg/dL (ref 70–99)
Potassium: 4.1 meq/L (ref 3.5–5.1)
Sodium: 141 meq/L (ref 135–145)
Total Bilirubin: 0.4 mg/dL (ref 0.2–1.2)
Total Protein: 7.2 g/dL (ref 6.0–8.3)

## 2023-10-20 LAB — LIPID PANEL
Cholesterol: 200 mg/dL (ref 0–200)
HDL: 41.4 mg/dL (ref >39.00)
LDL Cholesterol: 119 mg/dL — ABNORMAL HIGH (ref 0–99)
NonHDL: 158.98
Total CHOL/HDL Ratio: 5
Triglycerides: 198 mg/dL — ABNORMAL HIGH (ref 0.0–149.0)
VLDL: 39.6 mg/dL (ref 0.0–40.0)

## 2023-10-20 LAB — HEMOGLOBIN A1C: Hgb A1c MFr Bld: 5.7 % (ref 4.6–6.5)

## 2023-10-20 MED ORDER — LISINOPRIL-HYDROCHLOROTHIAZIDE 20-12.5 MG PO TABS: 1.0000 | ORAL_TABLET | Freq: Every day | ORAL | 1 refills | Status: DC

## 2023-10-20 NOTE — Telephone Encounter (Signed)
 Discussed with pharmacist and this has been completed

## 2023-10-20 NOTE — Patient Instructions (Addendum)
 New medicine - lisinopril HCT 20/12.5mg  per day.  Recheck in 1 month  Return to the clinic or go to the nearest emergency room if any of your symptoms worsen or new symptoms occur.  Cmo controlar su hipertensin Managing Your Hypertension La hipertensin, tambin conocida como presin arterial alta, se produce cuando la sangre ejerce presin contra las paredes de las arterias con [place] fuerza. Las arterias son los vasos sanguneos que transportan la sangre desde el corazn hacia todas las partes del cuerpo. La hipertensin hace que el corazn haga ms esfuerzo para Marine scientist y puede provocar que las arterias se Armed forces training and education officer o Multimedia programmer. Qu significan las lecturas de la presin arterial Una lectura de la presin arterial consta de un nmero ms alto sobre un nmero ms bajo. El Gaffer, o nmero superior, es la presin sistlica. Es la medida de la presin de las arterias cuando el corazn late. El segundo nmero, o nmero inferior, es la presin diastlica. Es la medida de la presin en las arterias cuando el corazn se relaja. Para la [employer], Ivan presin arterial normal est por debajo de 120/80. La presin arterial deseada puede variar en funcin de las enfermedades, la edad y otros factores personales. La presin arterial se clasifica en cuatro etapas. Sobre la base de la lectura de su presin arterial, el mdico puede usar las siguientes etapas para determinar si necesita tratamiento y de qu tipo. La presin sistlica y la presin diastlica se miden en una unidad llamada milmetros de mercurio (mm Hg). Normal Presin sistlica: por debajo de 120. Presin diastlica: por debajo de 80. Elevada Presin sistlica: 120-129. Presin diastlica: por debajo de 80. Etapa 1 de hipertensin Presin sistlica: 130-139. Presin diastlica: 80-89. Etapa 2 de hipertensin Presin sistlica: 140 o ms. Presin diastlica: 90 o ms. Cmo puede afectarme esta  enfermedad? Controlar la hipertensin es Intel. Con el transcurso del [place], la hipertensin puede daar las arterias y Technical sales engineer el flujo de sangre hacia partes del cuerpo que incluyen el cerebro, el corazn y los riones. Tener hipertensin no tratada o no controlada puede causar: Infarto de miocardio. Accidente cerebrovascular. Debilitamiento de los vasos sanguneos (aneurisma). Insuficiencia cardaca. Dao renal. Dao ocular. Problemas de memoria y Librarian, academic. Demencia vascular. Qu medidas puedo tomar para controlar esta afeccin? La hipertensin se puede controlar haciendo [employer] estilo de vida y, posiblemente, tomando medicamentos. Su mdico le ayudar a crear un plan para bajar la presin arterial al rango normal. Es posible que lo deriven para que reciba asesoramiento sobre una dieta saludable y [place] fsica. Nutricin  Siga una dieta con alto contenido de fibras y [place], y con bajo contenido de sal (sodio), azcar agregada y Rosalin Hawking. Un ejemplo de plan de alimentacin se denomina dieta DASH. DASH es la sigla en ingls de "Enfoques alimentarios para detener la hipertensin". Para alimentarse de esta manera: Coma mucha fruta y verdura fresca. Trate de que la mitad del plato de cada comida sea de frutas y verduras. Coma cereales integrales, como pasta integral, arroz integral o pan integral. Llene aproximadamente un cuarto del plato con cereales integrales. Consuma productos lcteos descremados. Evite la ingesta de cortes de carne grasa, carne procesada o curada, y carne de ave con piel. Llene aproximadamente un cuarto del plato con protenas magras, como pescado, pollo sin piel, frijoles, huevos o tofu. Evite ingerir alimentos prehechos y procesados. En general, estos tienen mayor cantidad de sodio, azcar agregada y Steffanie [place]. Reduzca su ingesta diaria de sodio. Muchas  personas que tienen hipertensin deben comer menos de 1500 mg de [employer]. Estilo de  vida  Trabaje con su mdico para mantener un peso saludable o Curator. Pregntele cul es el peso recomendado para usted. Realice al menos 30 minutos de ejercicio que haga que se acelere su corazn (ejercicio Magazine features editor) la DIRECTV de la [place]. Estas actividades pueden incluir caminar, nadar o andar en bicicleta. Incluya ejercicios para fortalecer sus msculos (ejercicios de resistencia), como levantamiento de pesas, como parte de su rutina semanal de ejercicios. Intente realizar 30 minutos de este tipo de ejercicios al Kellogg a la [place]. No consuma ningn producto que contenga nicotina o tabaco. Estos productos incluyen cigarrillos, tabaco para Theatre manager y aparatos de vapeo, como los Administrator, Civil Service. Si necesita ayuda para dejar de consumir estos productos, consulte al American Express. Controle las enfermedades a largo plazo (crnicas), como el colesterol alto o la diabetes. Identifique sus causas de estrs y encuentre maneras de Charity fundraiser. Esto puede incluir meditacin, respiracin profunda o hacerse tiempo para Arts development officer divertidas. Consumo de alcohol No beba alcohol si: Su mdico le indica no hacerlo. Est embarazada, puede estar embarazada o est tratando de [place]. Si bebe alcohol: Limite la cantidad que bebe a lo siguiente: De 0 a 1 medida por da para las mujeres. De 0 a 2 medidas por da para los hombres. Sepa cunta cantidad de alcohol hay en las bebidas que toma. En los [address], una medida equivale a una botella de cerveza de 12 oz (355 ml), un vaso de vino de 5 oz (148 ml) o un vaso de una bebida alcohlica de alta graduacin de 1 oz (44 ml). Medicamentos El mdico puede recetarle medicamentos si los cambios en el estilo de vida no son suficientes para Museum/gallery curator la presin arterial y si: Su presin arterial sistlica es de 130 o ms. Su presin arterial diastlica es de 80 o ms. Use los medicamentos solamente como  se lo haya indicado el mdico. Siga cuidadosamente las indicaciones. Los medicamentos para la presin arterial deben tomarse como se lo haya indicado el mdico. Los medicamentos pierden eficacia al omitir las dosis. El hecho de omitir las dosis tambin [place] el riesgo de otros problemas. Monitoreo Antes de [facility] [facility] presin arterial: No fume, no consuma bebidas con cafena ni haga ejercicio dentro de los 30 minutos antes de tomar la medicin. Vaya al bao y vace la vejiga (orine). Permanezca sentado tranquilamente durante al menos 5 minutos antes de tomar las mediciones. Contrlese la presin arterial en su casa segn las indicaciones del mdico. Para hacer esto: Sintese con la espalda recta y con apoyo. Coloque los pies planos en el piso. No se cruce de piernas. Apoye el brazo sobre una superficie plana, como una mesa. Asegrese de que la parte superior del brazo est al nivel del corazn. Cada vez que tome una medicin, tome dos o tres lecturas con un minuto de separacin y [employer] Brands. Posiblemente tambin sea necesario que el mdico le controle la presin arterial de [place] regular. Informacin general Hable con su mdico acerca de la dieta, hbitos de ejercicio y otros factores del estilo de vida que pueden contribuir a la hipertensin. Revise con su mdico todos los medicamentos que toma ya que puede haber efectos secundarios o interacciones. Concurra a todas las visitas de seguimiento. El mdico puede ayudarle a crear y Dawayne Patricia su plan para controlar la presin arterial alta. Dnde obtener ms informacin National Heart,  Lung, and Blood Institute ([employer] del [place], los Pulmones y la [place]): PopSteam.is American Heart Association (Asociacin Estadounidense del Corazn): www.heart.org Comunquese con un mdico si: Piensa que tiene Runner, broadcasting/film/video a los medicamentos que ha tomado. Tiene dolores de cabeza frecuentes (recurrentes). Siente  mareos. Tiene hinchazn en los tobillos. Tiene problemas de visin. Solicite ayuda de inmediato si: Siente un dolor de cabeza intenso o confusin. Siente debilidad inusual, adormecimiento o que Hospital doctor. Siente un dolor intenso en el pecho o el abdomen. Vomita repetidas veces. Tiene dificultad para respirar. Estos sntomas pueden Customer service manager. Solicite ayuda de inmediato. Llame al 911. No espere a ver si los sntomas desaparecen. No conduzca por sus propios medios [employer] hospital. Resumen La hipertensin se produce cuando la sangre bombea en las arterias con mucha fuerza. Si esta afeccin no se controla, podra correr riesgo de tener complicaciones graves. La presin arterial deseada puede variar en funcin de las enfermedades, la edad y otros factores personales. Para la [employer], una presin arterial normal es menor que 120/80. La hipertensin se puede controlar mediante cambios en el estilo de vida, tomando medicamentos, o ambas cosas. Los [employer] Corporation estilo de vida para ayudar a Chief Operating Officer la hipertensin incluyen prdida de [place], seguir una dieta saludable, con bajo contenido de sodio, hacer ms ejercicio, dejar de fumar y limitar el consumo de alcohol. Esta informacin no tiene Theme park manager el consejo del mdico. Asegrese de hacerle al mdico cualquier pregunta que tenga. Document Revised: 04/12/2021 Document Reviewed: 04/12/2021 Elsevier Patient Education  2024 [employer].

## 2023-10-20 NOTE — Telephone Encounter (Signed)
 Copied from CRM [id number]. Topic: Clinical - Medication Question >> Oct 20, 2023  1:32 PM Kathryne Eriksson wrote: Reason for CRM: lisinopril-hydrochlorothiazide (ZESTORETIC) 20-12.5 MG tablet >> Oct 20, 2023  1:34 PM Kathryne Eriksson wrote: [facility] [phone]  Called in regards to patient lisinopril-hydrochlorothiazide (ZESTORETIC) 20-12.5 MG tablet. States that patient has already picked up the lisinopril 20 MG March 15, 90 day supply. They're wanting to know if patient is to stop that dosage and start the new one, or take both.

## 2023-10-20 NOTE — Progress Notes (Signed)
 Subjective:  Patient ID: Christy Hernandez, female    DOB: [birth year]  Age: 53 y.o. MRN: [id number]  CC:  Chief Complaint  Patient presents with   Medical Management of Chronic Issues    Pt notes BP seems to be staying high recently about a week ago went to another clinic and it was high there     HPI Christy Hernandez presents for above, here with Spanish interpreter.  Hypertension: Last visit with me in October 2024, blood pressure normal at that time at 134/78, treated with lisinopril 20 mg that had been increased since her September 27 visit. Blood pressure has been running higher recently.  Noted elevated reading in November when seen in the ED for torticollis. Continues to take lisinopril 20mg  every day.  Not checking herself. Elevated at visit at Urgent Care 09/21/22, treated for herpetic rash. In pain at that time. BP 150/87 per chart review on 09/21/23 - [facility].  Walking daily, no missed doses of meds.   Home readings:none BP Readings from Last 3 Encounters:  10/20/23 (!) 154/88  06/10/23 (!) 161/104  05/18/23 134/78   Lab Results  Component Value Date   CREATININE 0.59 04/14/2023   Prediabetes: Borderline prior. Weight improved.  Lab Results  Component Value Date   HGBA1C 5.7 04/14/2023   Wt Readings from Last 3 Encounters:  10/20/23 142 lb 9.6 oz (64.7 kg)  06/10/23 145 lb (65.8 kg)  05/18/23 145 lb (65.8 kg)   Hyperlipidemia: Borderline elevation with low ascvd score prior.  The 10-year ASCVD risk score (Arnett DK, et al., 2019) is: 4.1%   Values used to calculate the score:     Age: 26 years     Sex: Female     Is Non-Hispanic African American: No     Diabetic: No     Tobacco smoker: No     Systolic Blood Pressure: 154 mmHg     Is BP treated: Yes     HDL Cholesterol: 45 mg/dL     Total Cholesterol: 206 mg/dL  Lab Results  Component Value Date   CHOL 206 (H) 04/14/2023   HDL 45.00 04/14/2023   LDLCALC 128 (H) 04/14/2023    LDLDIRECT 141.0 04/08/2021   TRIG 161.0 (H) 04/14/2023   CHOLHDL 5 04/14/2023   Lab Results  Component Value Date   ALT 31 04/14/2023   AST 23 04/14/2023   ALKPHOS 69 04/14/2023   BILITOT 0.4 04/14/2023       History Patient Active Problem List   Diagnosis Date Noted   Well woman exam with routine gynecological exam 05/14/2019   Flu-like symptoms 09/19/2017   Nonintractable headache 09/19/2017   Fever 09/19/2017   Generalized muscle ache 09/19/2017   Past Medical History:  Diagnosis Date   Anemia    Hypertension    Past Surgical History:  Procedure Laterality Date   BREAST BIOPSY Right 2019   CESAREAN SECTION     INCISION AND DRAINAGE ABSCESS Right 04/10/2018   Procedure: INCISION AND DRAINAGE OF 2 RIGHT BREAST ABSCESSES X2;  Surgeon: Manus Rudd, MD;  Location: [facility] OR;  Service: General;  Laterality: Right;   TUBAL LIGATION Bilateral 11/26/2015   Procedure: POST PARTUM TUBAL LIGATION;  Surgeon: Catalina Antigua, MD;  Location: [facility];  Service: Gynecology;  Laterality: Bilateral;   No Known Allergies Prior to Admission medications   Medication Sig Start Date End Date Taking? Authorizing Provider  lisinopril (ZESTRIL) 20 MG tablet Take 1 tablet (20 mg  total) by mouth daily. 04/14/23  Yes Shade Flood, MD  methocarbamol (ROBAXIN) 500 MG tablet Take 1 tablet (500 mg total) by mouth 2 (two) times daily. Patient not taking: Reported on 10/20/2023 06/10/23   Garrison, [place] N, FNP   Social History   Socioeconomic History   Marital status: Married    Spouse name: Not on file   Number of children: Not on file   Years of education: Not on file   Highest education level: Not on file  Occupational History   Not on file  Tobacco Use   Smoking status: Never   Smokeless tobacco: Never  Vaping Use   Vaping status: Never Used  Substance and Sexual Activity   Alcohol use: No   Drug use: No   Sexual activity: Yes    Birth control/protection: Surgical  Other Topics  Concern   Not on file  Social History Narrative   Not on file   Social Drivers of Health   Financial Resource Strain: Not on file  Food Insecurity: No Food Insecurity (04/28/2022)   Hunger Vital Sign    Worried About Running Out of Food in the Last Year: Never true    Ran Out of Food in the Last Year: Never true  Transportation Needs: No Transportation Needs (04/28/2022)   PRAPARE - Administrator, Civil Service (Medical): No    Lack of Transportation (Non-Medical): No  Physical Activity: Not on file  Stress: Not on file  Social Connections: Not on file  Intimate Partner Violence: Not on file    Review of Systems  Constitutional:  Negative for fatigue and unexpected weight change.  Respiratory:  Negative for chest tightness and shortness of breath.   Cardiovascular:  Negative for chest pain, palpitations and leg swelling.  Gastrointestinal:  Negative for abdominal pain and blood in stool.  Neurological:  Negative for dizziness, syncope, light-headedness and headaches.     Objective:   Vitals:   10/20/23 1001  BP: (!) 154/88  Pulse: 93  Temp: 98.6 F (37 C)  TempSrc: Temporal  SpO2: 98%  Weight: 142 lb 9.6 oz (64.7 kg)  Height: 5' (1.524 m)     Physical Exam Vitals reviewed.  Constitutional:      Appearance: Normal appearance. She is well-developed.  HENT:     Head: Normocephalic and atraumatic.  Eyes:     Conjunctiva/sclera: Conjunctivae normal.     Pupils: Pupils are equal, round, and reactive to light.  Neck:     Vascular: No carotid bruit.  Cardiovascular:     Rate and Rhythm: Normal rate and regular rhythm.     Heart sounds: Normal heart sounds.  Pulmonary:     Effort: Pulmonary effort is normal.     Breath sounds: Normal breath sounds.  Abdominal:     Palpations: Abdomen is soft. There is no pulsatile mass.     Tenderness: There is no abdominal tenderness.  Musculoskeletal:     Right lower leg: No edema.     Left lower leg: No edema.   Skin:    General: Skin is warm and dry.  Neurological:     Mental Status: She is alert and oriented to person, place, and time.  Psychiatric:        Mood and Affect: Mood normal.        Behavior: Behavior normal.      Assessment & Plan:  Christy Hernandez is a 53 y.o. female . Hyperlipidemia, unspecified hyperlipidemia type -  Plan: Lipid panel, Comprehensive metabolic panel with GFR  Prediabetes - Plan: Hemoglobin A1c  Essential hypertension - Plan: lisinopril-hydrochlorothiazide (ZESTORETIC) 20-12.5 MG tablet, Comprehensive metabolic panel with GFR  Decreased hypertension control.  Will add hydrochlorothiazide for combination of lisinopril 20, HCTZ 12.5 mg.  She has been on combination pill previously.  Potential side effects discussed, recheck in 1 month with labs, baseline labs today.  No current meds for her cholesterol, check updated labs, weight has improved.  Anticipate improved lipids and A1c with prior prediabetes.  Borderline previously.  1 month recheck.  Meds ordered this encounter  Medications   lisinopril-hydrochlorothiazide (ZESTORETIC) 20-12.5 MG tablet    Sig: Take 1 tablet by mouth daily.    Dispense:  90 tablet    Refill:  1   Patient Instructions  New medicine - lisinopril HCT 20/12.5mg  per day.  Recheck in 1 month  Return to the clinic or go to the nearest emergency room if any of your symptoms worsen or new symptoms occur.  Cmo controlar su hipertensin Managing Your Hypertension La hipertensin, tambin conocida como presin arterial alta, se produce cuando la sangre ejerce presin contra las paredes de las arterias con [place] fuerza. Las arterias son los vasos sanguneos que transportan la sangre desde el corazn hacia todas las partes del cuerpo. La hipertensin hace que el corazn haga ms esfuerzo para Marine scientist y puede provocar que las arterias se Armed forces training and education officer o Multimedia programmer. Qu significan las lecturas de la presin arterial Una  lectura de la presin arterial consta de un nmero ms alto sobre un nmero ms bajo. El Gaffer, o nmero superior, es la presin sistlica. Es la medida de la presin de las arterias cuando el corazn late. El segundo nmero, o nmero inferior, es la presin diastlica. Es la medida de la presin en las arterias cuando el corazn se relaja. Para la [employer], [place] presin arterial normal est por debajo de 120/80. La presin arterial deseada puede variar en funcin de las enfermedades, la edad y otros factores personales. La presin arterial se clasifica en cuatro etapas. Sobre la base de la lectura de su presin arterial, el mdico puede usar las siguientes etapas para determinar si necesita tratamiento y de qu tipo. La presin sistlica y la presin diastlica se miden en una unidad llamada milmetros de mercurio (mm Hg). Normal Presin sistlica: por debajo de 120. Presin diastlica: por debajo de 80. Elevada Presin sistlica: 120-129. Presin diastlica: por debajo de 80. Etapa 1 de hipertensin Presin sistlica: 130-139. Presin diastlica: 80-89. Etapa 2 de hipertensin Presin sistlica: 140 o ms. Presin diastlica: 90 o ms. Cmo puede afectarme esta enfermedad? Controlar la hipertensin es [employer]. Con el transcurso del [place], la hipertensin puede daar las arterias y Technical sales engineer el flujo de sangre hacia partes del cuerpo que incluyen el cerebro, el corazn y los riones. Tener hipertensin no tratada o no controlada puede causar: Infarto de miocardio. Accidente cerebrovascular. Debilitamiento de los vasos sanguneos (aneurisma). Insuficiencia cardaca. Dao renal. Dao ocular. Problemas de memoria y Librarian, academic. Demencia vascular. Qu medidas puedo tomar para controlar esta afeccin? La hipertensin se puede controlar haciendo [employer] estilo de vida y, posiblemente, tomando medicamentos. Su mdico le ayudar a crear un plan para bajar  la presin arterial al rango normal. Es posible que lo deriven para que reciba asesoramiento sobre una dieta saludable y [place] fsica. Nutricin  Siga una dieta con alto contenido de fibras y [place], y con bajo contenido de sal (  sodio), azcar agregada y grasas. Un ejemplo de plan de alimentacin se denomina dieta DASH. DASH es la sigla en ingls de "Enfoques alimentarios para detener la hipertensin". Para alimentarse de esta manera: Coma mucha fruta y verdura fresca. Trate de que la mitad del plato de cada comida sea de frutas y verduras. Coma cereales integrales, como pasta integral, arroz integral o pan integral. Llene aproximadamente un cuarto del plato con cereales integrales. Consuma productos lcteos descremados. Evite la ingesta de cortes de carne grasa, carne procesada o curada, y carne de ave con piel. Llene aproximadamente un cuarto del plato con protenas magras, como pescado, pollo sin piel, frijoles, huevos o tofu. Evite ingerir alimentos prehechos y procesados. En general, estos tienen mayor cantidad de sodio, azcar agregada y Steffanie [place]. Reduzca su ingesta diaria de sodio. Muchas personas que tienen hipertensin deben comer menos de 1500 mg de [employer]. Estilo de vida  Trabaje con su mdico para mantener un peso saludable o Curator. Pregntele cul es el peso recomendado para usted. Realice al menos 30 minutos de ejercicio que haga que se acelere su corazn (ejercicio Magazine features editor) la DIRECTV de la [place]. Estas actividades pueden incluir caminar, nadar o andar en bicicleta. Incluya ejercicios para fortalecer sus msculos (ejercicios de resistencia), como levantamiento de pesas, como parte de su rutina semanal de ejercicios. Intente realizar 30 minutos de este tipo de ejercicios al Kellogg a la [place]. No consuma ningn producto que contenga nicotina o tabaco. Estos productos incluyen cigarrillos, tabaco para Theatre manager y aparatos de vapeo, como los Soil scientist. Si necesita ayuda para dejar de consumir estos productos, consulte al American Express. Controle las enfermedades a largo plazo (crnicas), como el colesterol alto o la diabetes. Identifique sus causas de estrs y encuentre maneras de Charity fundraiser. Esto puede incluir meditacin, respiracin profunda o hacerse tiempo para Arts development officer divertidas. Consumo de alcohol No beba alcohol si: Su mdico le indica no hacerlo. Est embarazada, puede estar embarazada o est tratando de [place]. Si bebe alcohol: Limite la cantidad que bebe a lo siguiente: De 0 a 1 medida por da para las mujeres. De 0 a 2 medidas por da para los hombres. Sepa cunta cantidad de alcohol hay en las bebidas que toma. En los [address], una medida equivale a una botella de cerveza de 12 oz (355 ml), un vaso de vino de 5 oz (148 ml) o un vaso de una bebida alcohlica de alta graduacin de 1 oz (44 ml). Medicamentos El mdico puede recetarle medicamentos si los cambios en el estilo de vida no son suficientes para Museum/gallery curator la presin arterial y si: Su presin arterial sistlica es de 130 o ms. Su presin arterial diastlica es de 80 o ms. Use los medicamentos solamente como se lo haya indicado el mdico. Siga cuidadosamente las indicaciones. Los medicamentos para la presin arterial deben tomarse como se lo haya indicado el mdico. Los medicamentos pierden eficacia al omitir las dosis. El hecho de omitir las dosis tambin [place] el riesgo de otros problemas. Monitoreo Antes de [facility] presin arterial: No fume, no consuma bebidas con cafena ni haga ejercicio dentro de los 30 minutos antes de tomar la medicin. Vaya al bao y vace la vejiga (orine). Permanezca sentado tranquilamente durante al menos 5 minutos antes de tomar las mediciones. Contrlese la presin arterial en su casa segn las indicaciones del mdico. Para hacer esto: Sintese con la espalda recta y con  apoyo. Coloque los pies  planos en el piso. No se cruce de piernas. Apoye el brazo sobre una superficie plana, como una mesa. Asegrese de que la parte superior del brazo est al nivel del corazn. Cada vez que tome una medicin, tome dos o tres lecturas con un minuto de separacin y [employer] Brands. Posiblemente tambin sea necesario que el mdico le controle la presin arterial de [place] regular. Informacin general Hable con su mdico acerca de la dieta, hbitos de ejercicio y otros factores del estilo de vida que pueden contribuir a la hipertensin. Revise con su mdico todos los medicamentos que toma ya que puede haber efectos secundarios o interacciones. Concurra a todas las visitas de seguimiento. El mdico puede ayudarle a crear y Dawayne Patricia su plan para controlar la presin arterial alta. Dnde obtener ms informacin [employer], Lung, and Blood Institute ([facility] del [place], los Pulmones y Risk manager): PopSteam.is American Heart Association (Asociacin Estadounidense del Corazn): www.heart.org Comunquese con un mdico si: Piensa que tiene Runner, broadcasting/film/video a los medicamentos que ha tomado. Tiene dolores de cabeza frecuentes (recurrentes). Siente mareos. Tiene hinchazn en los tobillos. Tiene problemas de visin. Solicite ayuda de inmediato si: Siente un dolor de cabeza intenso o confusin. Siente debilidad inusual, adormecimiento o que Hospital doctor. Siente un dolor intenso en el pecho o el abdomen. Vomita repetidas veces. Tiene dificultad para respirar. Estos sntomas pueden Customer service manager. Solicite ayuda de inmediato. Llame al 911. No espere a ver si los sntomas desaparecen. No conduzca por sus propios medios [employer] hospital. Resumen La hipertensin se produce cuando la sangre bombea en las arterias con mucha fuerza. Si esta afeccin no se controla, podra correr riesgo de tener complicaciones graves. La presin arterial deseada puede  variar en funcin de las enfermedades, la edad y otros factores personales. Para la [employer], una presin arterial normal es menor que 120/80. La hipertensin se puede controlar mediante cambios en el estilo de vida, tomando medicamentos, o ambas cosas. Los [employer] estilo de vida para ayudar a Chief Operating Officer la hipertensin incluyen prdida de [place], seguir una dieta saludable, con bajo contenido de sodio, hacer ms ejercicio, dejar de fumar y limitar el consumo de alcohol. Esta informacin no tiene Theme park manager el consejo del mdico. Asegrese de hacerle al mdico cualquier pregunta que tenga. Document Revised: 04/12/2021 Document Reviewed: 04/12/2021 Elsevier Patient Education  2024 Elsevier Inc.      Signed,   Meredith Staggers, MD [facility], [facility] 10/20/23 10:38 AM

## 2023-10-20 NOTE — Telephone Encounter (Signed)
 Reviewed AVS and this med is to replace previous medication, attempted call to the pharmacy but they are closed for lunch till 2

## 2023-10-25 ENCOUNTER — Telehealth: Payer: Self-pay

## 2023-10-25 NOTE — Telephone Encounter (Signed)
 Please call with Spanish interpreter   Electrolytes, kidney, liver test looked okay.  Prediabetes test was stable.  Cholesterol levels were mildly elevated but improved from previous readings.  With a low 10-year heart disease risk score I do not recommend any new medications at this time.  Let me know if there are questions.

## 2023-10-27 NOTE — Telephone Encounter (Signed)
 Tried to call patient with interpreter no answer and VM box was full, will call again later to relay labs

## 2023-10-30 NOTE — Telephone Encounter (Signed)
 Tried calling no answer, unable to LM

## 2023-10-31 ENCOUNTER — Encounter: Payer: Self-pay | Admitting: Family Medicine

## 2023-10-31 NOTE — Telephone Encounter (Signed)
 Tried to call patient with interpreter no answer and Left VM to return call.

## 2023-11-01 NOTE — Telephone Encounter (Signed)
 Letter out.

## 2023-11-24 ENCOUNTER — Ambulatory Visit: Admitting: Family Medicine

## 2023-11-24 ENCOUNTER — Encounter: Payer: Self-pay | Admitting: Family Medicine

## 2023-11-24 VITALS — BP 128/70 | HR 80 | Temp 97.8°F | Ht 60.0 in | Wt 140.6 lb

## 2023-11-24 DIAGNOSIS — R7303 Prediabetes: Secondary | ICD-10-CM | POA: Diagnosis not present

## 2023-11-24 DIAGNOSIS — B029 Zoster without complications: Secondary | ICD-10-CM

## 2023-11-24 DIAGNOSIS — E785 Hyperlipidemia, unspecified: Secondary | ICD-10-CM | POA: Diagnosis not present

## 2023-11-24 DIAGNOSIS — I10 Essential (primary) hypertension: Secondary | ICD-10-CM | POA: Diagnosis not present

## 2023-11-24 NOTE — Progress Notes (Signed)
 Subjective:  Patient ID: Christy Hernandez, female    DOB: Jan 22, 1971  Age: 53 y.o. MRN: [id number]  CC:  Chief Complaint  Patient presents with   Medical Management of Chronic Issues    Pt notes no questions or concerns today,      HPI Christy Hernandez presents for follow up - here with interpreter.   Hypertension: Follow-up from April visit.  Uncontrolled at that time, added hydrochlorothiazide  for combination of lisinopril  20 mg/HCTZ 12.5 mg daily. No side effects, no home readings: BP Readings from Last 3 Encounters:  11/24/23 128/70  10/20/23 (!) 154/88  06/10/23 (!) 161/104   Lab Results  Component Value Date   CREATININE 0.54 10/20/2023   Hyperlipidemia: Commended on weight loss, positive health changes at last visit.  LDL had improved from 141-119.  No new meds for now. Still walking an hour per day.  Lab Results  Component Value Date   CHOL 200 10/20/2023   HDL 41.40 10/20/2023   LDLCALC 119 (H) 10/20/2023   LDLDIRECT 141.0 04/08/2021   TRIG 198.0 (H) 10/20/2023   CHOLHDL 5 10/20/2023   Lab Results  Component Value Date   ALT 23 10/20/2023   AST 19 10/20/2023   ALKPHOS 64 10/20/2023   BILITOT 0.4 10/20/2023   Prediabetes: As above she had made some significant improvements with weight loss A1c stable, borderline prediabetes at 5.7 on most recent testing.  Weight has improved by another few pounds today.   Lab Results  Component Value Date   HGBA1C 5.7 10/20/2023   Wt Readings from Last 3 Encounters:  11/24/23 140 lb 9.6 oz (63.8 kg)  10/20/23 142 lb 9.6 oz (64.7 kg)  06/10/23 145 lb (65.8 kg)   The 10-year ASCVD risk score (Arnett DK, et al., 2019) is: 3%   Values used to calculate the score:     Age: 2 years     Sex: Female     Is Non-Hispanic African American: No     Diabetic: No     Tobacco smoker: No     Systolic Blood Pressure: 128 mmHg     Is BP treated: Yes     HDL Cholesterol: 41.4 mg/dL     Total Cholesterol:  200 mg/dL   Immunization History  Administered Date(s) Administered   Hepatitis A 04/15/2009, 06/04/2009   Hepatitis B 04/15/2009, 06/04/2009   MMR 05/20/2006   Moderna Sars-Covid-2 Vaccination 05/22/2020, 06/19/2020   Td 01/09/2006   Tdap 08/27/2015   Varicella 01/27/2016  Shingrix today discussed. 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  She declines vaccine at this time given possible recent disease.   History Patient Active Problem List   Diagnosis Date Noted   Well woman exam with routine gynecological exam 05/14/2019   Flu-like symptoms 09/19/2017   Nonintractable headache 09/19/2017   Fever 09/19/2017   Generalized muscle ache 09/19/2017   Past Medical History:  Diagnosis Date   Anemia    Hypertension    Past Surgical History:  Procedure Laterality Date   BREAST BIOPSY Right 2019   CESAREAN SECTION     INCISION AND DRAINAGE ABSCESS Right 04/10/2018   Procedure: INCISION AND DRAINAGE OF 2 RIGHT BREAST ABSCESSES X2;  Surgeon: Dareen Ebbing, MD;  Location: [facility];  Service: General;  Laterality: Right;   TUBAL LIGATION Bilateral 11/26/2015   Procedure: POST PARTUM TUBAL LIGATION;  Surgeon: Verlyn Goad, MD;  Location: [facility];  Service: Gynecology;  Laterality: Bilateral;   No Known Allergies Prior to Admission medications   Medication Sig Start Date End Date Taking? Authorizing Provider  lisinopril -hydrochlorothiazide  (ZESTORETIC ) 20-12.5 MG tablet Take 1 tablet by mouth daily. 10/20/23  Yes Benjiman Bras, MD  methocarbamol  (ROBAXIN ) 500 MG tablet Take 1 tablet (500 mg total) by mouth 2 (two) times daily. Patient not taking: Reported on 11/24/2023 06/10/23   Halbert Levee, FNP   Social History   Socioeconomic History   Marital status: Married    Spouse name: Not on file   Number of children: Not on file   Years of education: Not on file   Highest education level: Not on file   Occupational History   Not on file  Tobacco Use   Smoking status: Never   Smokeless tobacco: Never  Vaping Use   Vaping status: Never Used  Substance and Sexual Activity   Alcohol use: No   Drug use: No   Sexual activity: Yes    Birth control/protection: Surgical  Other Topics Concern   Not on file  Social History Narrative   Not on file   Social Drivers of Health   Financial Resource Strain: Not on file  Food Insecurity: No Food Insecurity (04/28/2022)   Hunger Vital Sign    Worried About Running Out of Food in the Last Year: Never true    Ran Out of Food in the Last Year: Never true  Transportation Needs: No Transportation Needs (04/28/2022)   PRAPARE - Administrator, Civil Service (Medical): No    Lack of Transportation (Non-Medical): No  Physical Activity: Not on file  Stress: Not on file  Social Connections: Not on file  Intimate Partner Violence: Not on file    Review of Systems  Constitutional:  Negative for fatigue and unexpected weight change.  Respiratory:  Negative for chest tightness and shortness of breath.   Cardiovascular:  Negative for chest pain, palpitations and leg swelling.  Gastrointestinal:  Negative for abdominal pain and blood in stool.  Neurological:  Negative for dizziness, syncope, light-headedness and headaches.     Objective:   Vitals:   11/24/23 1028  BP: 128/70  Pulse: 80  Temp: 97.8 F (36.6 C)  TempSrc: Temporal  SpO2: 97%  Weight: 140 lb 9.6 oz (63.8 kg)  Height: 5' (1.524 m)     Physical Exam Vitals reviewed.  Constitutional:      Appearance: Normal appearance. She is well-developed.  HENT:     Head: Normocephalic and atraumatic.  Eyes:     Conjunctiva/sclera: Conjunctivae normal.     Pupils: Pupils are equal, round, and reactive to light.  Neck:     Vascular: No carotid bruit.  Cardiovascular:     Rate and Rhythm: Normal rate and regular rhythm.     Heart sounds: Normal heart sounds.  Pulmonary:      Effort: Pulmonary effort is normal.     Breath sounds: Normal breath sounds.  Abdominal:     Palpations: Abdomen is soft. There is no pulsatile mass.     Tenderness: There is no abdominal tenderness.  Musculoskeletal:     Right lower leg: No edema.     Left lower leg: No edema.  Skin:    General: Skin is warm and dry.  Neurological:     Mental Status: She is alert and oriented to person, place, and time.  Psychiatric:        Mood and  Affect: Mood normal.        Behavior: Behavior normal.        Assessment & Plan:  Christy Hernandez is a 53 y.o. female . Hyperlipidemia, unspecified hyperlipidemia type  - Improving and low ASCVD risk score, continue exercise, no new meds for now.  Prediabetes  - Stable, barely at prediabetes range and expect this to continue to improve and likely will revert to normal if she continues walking, or other positive health changes.  Will recheck at 41-month follow-up.  Essential hypertension  - Tolerating med change, improved control, no new side effects.  Continue same with recheck in 6 months  Herpes zoster without complication  - Discussed shingles vaccine today but on discussion and review of chart she was seen by other office in March for vesicular rash on buttocks, unilateral, and without known exposure to genital HSV this certainly could have been shingles outbreak.  Given recent disease she has decided against shingles vaccine at this time.  We discussed likely temporary protection but would consider that vaccine within the next few years.  Understanding expressed with interpreter in office today.  No orders of the defined types were placed in this encounter.  Patient Instructions  Blood pressure looks ok today on new med. No changes at this time. Keep up the good work with walking.  We will recheck your labs and blood pressure in 6 months at follow-up visit.  Cholesterol is also improving, I do not think you need to be on any  medication.  Shingles vaccine is an option but as we discussed if you had a recent shingles outbreak, then it would be unlikely to have a repeat outbreak in the next few years.  We can update your shingles vaccine anytime though.  Please let us  know if there are questions and take care!    Signed,   Caro Christmas, MD [facility], [facility] 11/24/23 11:01 AM

## 2023-11-24 NOTE — Patient Instructions (Addendum)
 Blood pressure looks ok today on new med. No changes at this time. Keep up the good work with walking.  We will recheck your labs and blood pressure in 6 months at follow-up visit.  Cholesterol is also improving, I do not think you need to be on any medication.  Shingles vaccine is an option but as we discussed if you had a recent shingles outbreak, then it would be unlikely to have a repeat outbreak in the next few years.  We can update your shingles vaccine anytime though.  Please let us  know if there are questions and take care!

## 2024-04-10 ENCOUNTER — Other Ambulatory Visit: Payer: Self-pay | Admitting: Family Medicine

## 2024-04-10 DIAGNOSIS — I10 Essential (primary) hypertension: Secondary | ICD-10-CM

## 2024-05-27 ENCOUNTER — Ambulatory Visit: Admitting: Family Medicine

## 2024-06-05 ENCOUNTER — Encounter: Payer: Self-pay | Admitting: Family Medicine

## 2024-06-05 ENCOUNTER — Ambulatory Visit (INDEPENDENT_AMBULATORY_CARE_PROVIDER_SITE_OTHER): Admitting: Family Medicine

## 2024-06-05 VITALS — BP 128/88 | HR 80 | Temp 99.5°F | Resp 12 | Ht 60.0 in | Wt 138.6 lb

## 2024-06-05 DIAGNOSIS — R7303 Prediabetes: Secondary | ICD-10-CM

## 2024-06-05 DIAGNOSIS — E785 Hyperlipidemia, unspecified: Secondary | ICD-10-CM | POA: Diagnosis not present

## 2024-06-05 DIAGNOSIS — I10 Essential (primary) hypertension: Secondary | ICD-10-CM | POA: Diagnosis not present

## 2024-06-05 DIAGNOSIS — Z1159 Encounter for screening for other viral diseases: Secondary | ICD-10-CM | POA: Diagnosis not present

## 2024-06-05 LAB — COMPREHENSIVE METABOLIC PANEL WITH GFR
ALT: 26 U/L (ref 0–35)
AST: 20 U/L (ref 0–37)
Albumin: 4.5 g/dL (ref 3.5–5.2)
Alkaline Phosphatase: 70 U/L (ref 39–117)
BUN: 10 mg/dL (ref 6–23)
CO2: 30 meq/L (ref 19–32)
Calcium: 9.3 mg/dL (ref 8.4–10.5)
Chloride: 98 meq/L (ref 96–112)
Creatinine, Ser: 0.62 mg/dL (ref 0.40–1.20)
GFR: 100.98 mL/min (ref >60.00)
Glucose, Bld: 90 mg/dL (ref 70–99)
Potassium: 4.2 meq/L (ref 3.5–5.1)
Sodium: 134 meq/L — ABNORMAL LOW (ref 135–145)
Total Bilirubin: 0.3 mg/dL (ref 0.2–1.2)
Total Protein: 7.6 g/dL (ref 6.0–8.3)

## 2024-06-05 LAB — LIPID PANEL
Cholesterol: 205 mg/dL — ABNORMAL HIGH (ref 0–200)
HDL: 36.8 mg/dL — ABNORMAL LOW (ref >39.00)
NonHDL: 167.93
Total CHOL/HDL Ratio: 6
Triglycerides: 644 mg/dL — ABNORMAL HIGH (ref 0.0–149.0)
VLDL: 128.8 mg/dL — ABNORMAL HIGH (ref 0.0–40.0)

## 2024-06-05 LAB — LDL CHOLESTEROL, DIRECT: Direct LDL: 99 mg/dL

## 2024-06-05 MED ORDER — LISINOPRIL-HYDROCHLOROTHIAZIDE 20-12.5 MG PO TABS: 1.0000 | ORAL_TABLET | Freq: Every day | ORAL | 2 refills | Status: AC

## 2024-06-05 NOTE — Patient Instructions (Signed)
 Thank you for coming in today. No change in medications at this time. If there are any concerns on your bloodwork, I will let you know. Take care!

## 2024-06-05 NOTE — Progress Notes (Signed)
 Subjective:  Patient ID: Christy Hernandez, female    DOB: 03/17/1971  Age: 53 y.o. MRN: [id number]  CC:  Chief Complaint  Patient presents with   Hyperlipidemia   Hypertension    No questions or concerns.     HPI Christy Hernandez presents for follow-up of chronic conditions, here with Spanish interpreter.  Hypertension: Treated with lisinopril  HCTZ 20/12.5 mg daily.  No side effects.  Home readings: none, but at dentist looked okay.   BP Readings from Last 3 Encounters:  06/05/24 128/88  11/24/23 128/70  10/20/23 (!) 154/88   Lab Results  Component Value Date   CREATININE 0.54 10/20/2023   Hyperlipidemia: No current meds.  Low ASCVD risk score, diet/exercise approach.  She had made some significant improvements with weight loss at prior visits.  Weight has improved further today. Still walking every day.  Lab Results  Component Value Date   CHOL 200 10/20/2023   HDL 41.40 10/20/2023   LDLCALC 119 (H) 10/20/2023   LDLDIRECT 141.0 04/08/2021   TRIG 198.0 (H) 10/20/2023   CHOLHDL 5 10/20/2023   Lab Results  Component Value Date   ALT 23 10/20/2023   AST 19 10/20/2023   ALKPHOS 64 10/20/2023   BILITOT 0.4 10/20/2023  The 10-year ASCVD risk score (Arnett DK, et al., 2019) is: 3.2%   Values used to calculate the score:     Age: 32 years     Clincally relevant sex: Female     Is Non-Hispanic African American: No     Diabetic: No     Tobacco smoker: No     Systolic Blood Pressure: 128 mmHg     Is BP treated: Yes     HDL Cholesterol: 41.4 mg/dL     Total Cholesterol: 200 mg/dL   Prediabetes: Borderline earlier this year, weight has continued to improve. Lab Results  Component Value Date   HGBA1C 5.7 10/20/2023   Wt Readings from Last 3 Encounters:  06/05/24 138 lb 9.6 oz (62.9 kg)  11/24/23 140 lb 9.6 oz (63.8 kg)  10/20/23 142 lb 9.6 oz (64.7 kg)   HM: Flu vaccine, shingles, hep B vaccine, covid, pneumonia vaccines declined. Hep C  screen today.  Pap - negative 04/2019 - with negative HPV. Recommended repeat testing - will have done here with lab review in next few months.   Had red eye in October, better now. Normal vision. Plans eval if sx's recur.   History Patient Active Problem List   Diagnosis Date Noted   Well woman exam with routine gynecological exam 05/14/2019   Flu-like symptoms 09/19/2017   Nonintractable headache 09/19/2017   Fever 09/19/2017   Generalized muscle ache 09/19/2017   Past Medical History:  Diagnosis Date   Anemia    Hypertension    Past Surgical History:  Procedure Laterality Date   BREAST BIOPSY Right 2019   CESAREAN SECTION     INCISION AND DRAINAGE ABSCESS Right 04/10/2018   Procedure: INCISION AND DRAINAGE OF 2 RIGHT BREAST ABSCESSES X2;  Surgeon: Belinda Cough, MD;  Location: [facility] OR;  Service: General;  Laterality: Right;   TUBAL LIGATION Bilateral 11/26/2015   Procedure: POST PARTUM TUBAL LIGATION;  Surgeon: Winton Felt, MD;  Location: [facility];  Service: Gynecology;  Laterality: Bilateral;   No Known Allergies Prior to Admission medications   Medication Sig Start Date End Date Taking? Authorizing Provider  lisinopril -hydrochlorothiazide  (ZESTORETIC ) 20-12.5 MG tablet Take 1 tablet by mouth once daily 04/10/24  Yes  Levora Reyes SAUNDERS, MD  methocarbamol  (ROBAXIN ) 500 MG tablet Take 1 tablet (500 mg total) by mouth 2 (two) times daily. Patient not taking: Reported on 06/05/2024 06/10/23   Dreama Worthy SAILOR, FNP   Social History   Socioeconomic History   Marital status: Married    Spouse name: Not on file   Number of children: Not on file   Years of education: Not on file   Highest education level: Not on file  Occupational History   Not on file  Tobacco Use   Smoking status: Never   Smokeless tobacco: Never  Vaping Use   Vaping status: Never Used  Substance and Sexual Activity   Alcohol use: No   Drug use: No   Sexual activity: Yes    Birth control/protection:  Surgical  Other Topics Concern   Not on file  Social History Narrative   Not on file   Social Drivers of Health   Financial Resource Strain: Patient Declined (06/05/2024)   Overall Financial Resource Strain (CARDIA)    Difficulty of Paying Living Expenses: Patient declined  Food Insecurity: Patient Declined (06/05/2024)   Hunger Vital Sign    Worried About Running Out of Food in the Last Year: Patient declined    Ran Out of Food in the Last Year: Patient declined  Transportation Needs: Patient Declined (06/05/2024)   PRAPARE - Administrator, Civil Service (Medical): Patient declined    Lack of Transportation (Non-Medical): Patient declined  Physical Activity: Sufficiently Active (06/05/2024)   Exercise Vital Sign    Days of Exercise per Week: 7 days    Minutes of Exercise per Session: 60 min  Stress: No Stress Concern Present (06/05/2024)   [employer] of Occupational Health - Occupational Stress Questionnaire    Feeling of Stress: Not at all  Social Connections: Unknown (06/05/2024)   Social Connection and Isolation Panel    Frequency of Communication with Friends and Family: Patient declined    Frequency of Social Gatherings with Friends and Family: Patient declined    Attends Religious Services: Patient declined    Database Administrator or Organizations: No    Attends Engineer, Structural: Not on file    Marital Status: Married  Catering Manager Violence: Not on file    Review of Systems  Constitutional:  Negative for fatigue and unexpected weight change.  Respiratory:  Negative for chest tightness and shortness of breath.   Cardiovascular:  Negative for chest pain, palpitations and leg swelling.  Gastrointestinal:  Negative for abdominal pain and blood in stool.  Neurological:  Negative for dizziness, syncope, light-headedness and headaches.     Objective:   Vitals:   06/05/24 1324  BP: 128/88  Pulse: 80  Resp: 12  Temp: 99.5 F  (37.5 C)  TempSrc: Temporal  SpO2: 98%  Weight: 138 lb 9.6 oz (62.9 kg)  Height: 5' (1.524 m)     Physical Exam Vitals reviewed.  Constitutional:      Appearance: Normal appearance. She is well-developed.  HENT:     Head: Normocephalic and atraumatic.  Eyes:     Conjunctiva/sclera: Conjunctivae normal.     Pupils: Pupils are equal, round, and reactive to light.  Neck:     Vascular: No carotid bruit.  Cardiovascular:     Rate and Rhythm: Normal rate and regular rhythm.     Heart sounds: Normal heart sounds.  Pulmonary:     Effort: Pulmonary effort is normal.     Breath  sounds: Normal breath sounds.  Abdominal:     Palpations: Abdomen is soft. There is no pulsatile mass.     Tenderness: There is no abdominal tenderness.  Musculoskeletal:     Right lower leg: No edema.     Left lower leg: No edema.  Skin:    General: Skin is warm and dry.  Neurological:     Mental Status: She is alert and oriented to person, place, and time.  Psychiatric:        Mood and Affect: Mood normal.        Behavior: Behavior normal.        Assessment & Plan:  Christy Hernandez is a 53 y.o. female . Hyperlipidemia, unspecified hyperlipidemia type - Plan: Comprehensive metabolic panel with GFR, Lipid panel -low ASCVD risk score, weight has improved.  Diet/exercise approach.  Check updated labs and adjust plan accordingly.  Essential hypertension - Plan: Comprehensive metabolic panel with GFR, lisinopril -hydrochlorothiazide  (ZESTORETIC ) 20-12.5 MG tablet  - Tolerating current med regimen, check labs and adjust plan accordingly but stable at this time.  No changes.  Prediabetes - Plan: Hemoglobin A1c  - Borderline on last testing, and as weight improves aspect A1c also improved.  Check labs and adjust plan accordingly.  Commended on exercise and weight management.  Need for hepatitis C screening test - Plan: Hepatitis C antibody   Meds ordered this encounter  Medications    lisinopril -hydrochlorothiazide  (ZESTORETIC ) 20-12.5 MG tablet    Sig: Take 1 tablet by mouth daily.    Dispense:  90 tablet    Refill:  2   Patient Instructions  Thank you for coming in today. No change in medications at this time. If there are any concerns on your bloodwork, I will let you know. Take care!      Signed,   Reyes Pines, MD [facility], [facility] 06/05/24 1:50 PM

## 2024-06-06 LAB — HEPATITIS C ANTIBODY: Hepatitis C Ab: NONREACTIVE

## 2024-06-06 LAB — HEMOGLOBIN A1C: Hgb A1c MFr Bld: 5.5 % (ref 4.6–6.5)

## 2024-06-11 ENCOUNTER — Ambulatory Visit: Payer: Self-pay | Admitting: Family Medicine

## 2024-06-11 DIAGNOSIS — E781 Pure hyperglyceridemia: Secondary | ICD-10-CM

## 2024-06-12 NOTE — Progress Notes (Signed)
 Called patient with spanish interpreter to discuss labs. Patient needs to schedule lab only visit for repeat lipid. Patient needs to fast for 6 hours prior to appt. Please schedule. Left vm  Future labs ordered

## 2024-06-17 ENCOUNTER — Other Ambulatory Visit

## 2024-06-17 DIAGNOSIS — E781 Pure hyperglyceridemia: Secondary | ICD-10-CM

## 2024-06-17 NOTE — Progress Notes (Signed)
Lab appt made

## 2024-06-18 ENCOUNTER — Ambulatory Visit: Payer: Self-pay | Admitting: Family Medicine

## 2024-06-18 LAB — LIPID PANEL
Cholesterol: 187 mg/dL (ref <200)
HDL: 41 mg/dL — ABNORMAL LOW (ref >=50)
LDL Cholesterol (Calc): 116 mg/dL — ABNORMAL HIGH
Non-HDL Cholesterol (Calc): 146 mg/dL — ABNORMAL HIGH (ref <130)
Total CHOL/HDL Ratio: 4.6 (calc) (ref <5.0)
Triglycerides: 182 mg/dL — ABNORMAL HIGH (ref <150)

## 2024-08-22 ENCOUNTER — Other Ambulatory Visit: Payer: Self-pay | Admitting: Family Medicine

## 2024-08-22 DIAGNOSIS — Z1231 Encounter for screening mammogram for malignant neoplasm of breast: Secondary | ICD-10-CM

## 2024-08-23 ENCOUNTER — Inpatient Hospital Stay: Admission: RE | Admit: 2024-08-23

## 2024-08-23 DIAGNOSIS — Z1231 Encounter for screening mammogram for malignant neoplasm of breast: Secondary | ICD-10-CM

## 2024-09-09 ENCOUNTER — Encounter: Admitting: Family Medicine
